# Patient Record
Sex: Male | Born: 1975
Health system: Southern US, Community
[De-identification: ages and names within clinical notes are randomized; demographics above are authoritative.]

## PROBLEM LIST (undated history)

## (undated) ENCOUNTER — Ambulatory Visit (HOSPITAL_COMMUNITY): Discharge status: Absent without leave | Payer: 59

## (undated) DIAGNOSIS — I10 Essential (primary) hypertension: Secondary | ICD-10-CM

## (undated) DIAGNOSIS — E119 Type 2 diabetes mellitus without complications: Secondary | ICD-10-CM

---

## 2013-10-08 HISTORY — PX: OTHER SURGICAL HISTORY: SHX169

## 2017-06-02 ENCOUNTER — Emergency Department
Admission: EM | Admit: 2017-06-02 | Discharge: 2017-06-02 | Disposition: A | Discharge status: Home / Self Care | Payer: Self-pay | Attending: Emergency Medicine | Admitting: Emergency Medicine

## 2017-06-02 ENCOUNTER — Encounter: Payer: Self-pay | Admitting: Emergency Medicine

## 2017-06-02 DIAGNOSIS — J320 Chronic maxillary sinusitis: Secondary | ICD-10-CM | POA: Insufficient documentation

## 2017-06-02 DIAGNOSIS — E119 Type 2 diabetes mellitus without complications: Secondary | ICD-10-CM | POA: Insufficient documentation

## 2017-06-02 DIAGNOSIS — F17200 Nicotine dependence, unspecified, uncomplicated: Secondary | ICD-10-CM | POA: Insufficient documentation

## 2017-06-02 DIAGNOSIS — Z79899 Other long term (current) drug therapy: Secondary | ICD-10-CM | POA: Insufficient documentation

## 2017-06-02 DIAGNOSIS — I10 Essential (primary) hypertension: Secondary | ICD-10-CM | POA: Insufficient documentation

## 2017-06-02 DIAGNOSIS — Z7984 Long term (current) use of oral hypoglycemic drugs: Secondary | ICD-10-CM | POA: Insufficient documentation

## 2017-06-02 DIAGNOSIS — R220 Localized swelling, mass and lump, head: Secondary | ICD-10-CM

## 2017-06-02 HISTORY — DX: Type 2 diabetes mellitus without complications: E11.9

## 2017-06-02 HISTORY — DX: Essential (primary) hypertension: I10

## 2017-06-02 LAB — CBC WITH DIFFERENTIAL/PLATELET
BASOS PCT: 1 %
Basophils Absolute: 0 10*3/uL (ref 0–0.1)
EOS ABS: 0.2 10*3/uL (ref 0–0.7)
EOS PCT: 3 %
HCT: 45.7 % (ref 40.0–52.0)
Hemoglobin: 15.5 g/dL (ref 13.0–18.0)
LYMPHS ABS: 1.4 10*3/uL (ref 1.0–3.6)
Lymphocytes Relative: 17 %
MCH: 33 pg (ref 26.0–34.0)
MCHC: 34 g/dL (ref 32.0–36.0)
MCV: 97 fL (ref 80.0–100.0)
MONOS PCT: 11 %
Monocytes Absolute: 0.9 10*3/uL (ref 0.2–1.0)
Neutro Abs: 5.8 10*3/uL (ref 1.4–6.5)
Neutrophils Relative %: 70 %
PLATELETS: 232 10*3/uL (ref 150–440)
RBC: 4.71 MIL/uL (ref 4.40–5.90)
RDW: 14.3 % (ref 11.5–14.5)
WBC: 8.3 10*3/uL (ref 3.8–10.6)

## 2017-06-02 LAB — COMPREHENSIVE METABOLIC PANEL
ALT: 112 U/L — ABNORMAL HIGH (ref 17–63)
ANION GAP: 7 (ref 5–15)
AST: 61 U/L — ABNORMAL HIGH (ref 15–41)
Albumin: 3.6 g/dL (ref 3.5–5.0)
Alkaline Phosphatase: 83 U/L (ref 38–126)
BUN: 8 mg/dL (ref 6–20)
CALCIUM: 9.9 mg/dL (ref 8.9–10.3)
CO2: 23 mmol/L (ref 22–32)
Chloride: 108 mmol/L (ref 101–111)
Creatinine, Ser: 0.7 mg/dL (ref 0.61–1.24)
GFR calc non Af Amer: >60 mL/min (ref >60)
GLUCOSE: 157 mg/dL — AB (ref 65–99)
POTASSIUM: 4.3 mmol/L (ref 3.5–5.1)
SODIUM: 138 mmol/L (ref 135–145)
TOTAL PROTEIN: 7.9 g/dL (ref 6.5–8.1)
Total Bilirubin: 0.5 mg/dL (ref 0.3–1.2)

## 2017-06-02 MED ORDER — FLUTICASONE PROPIONATE 50 MCG/ACT NA SUSP: 2.0000 | Freq: Every day | NASAL | 0 refills | Status: DC

## 2017-06-02 MED ORDER — AMOXICILLIN-POT CLAVULANATE 875-125 MG PO TABS: 1.0000 | ORAL_TABLET | Freq: Two times a day (BID) | ORAL | 0 refills | Status: AC

## 2017-06-02 MED ORDER — CETIRIZINE HCL 10 MG PO TABS: 10.0000 mg | ORAL_TABLET | Freq: Every day | ORAL | 0 refills | Status: DC

## 2017-06-02 NOTE — ED Provider Notes (Signed)
[facility] Emergency Department Provider Note  ___________________________________________   First MD Initiated Contact with Patient 06/02/17 [phone]     (approximate)  I have reviewed the triage vital signs and the nursing notes.   HISTORY  Chief Complaint Facial Swelling   HPI Curtis Peterson is a 41 y.o. male patient is here with complaint of redness and swelling under his left eye. 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 He also noted his grass yesterday. He denies any foreign body in his eye and there is been no issues with light sensitivity. Patient has had sinus infections in the past. Currently he has had some sniffles off and on. He is unaware of any fever or chills. He has not taken any over-the-counter medication. He denies any dental pain.   Past Medical History:  Diagnosis Date  . Diabetes mellitus without complication ([place])   . Hypertension     There are no active problems to display for this patient.   History reviewed. No pertinent surgical history.  Prior to Admission medications   Medication Sig Start Date End Date Taking? Authorizing Provider  lisinopril (PRINIVIL,ZESTRIL) 20 MG tablet Take 20 mg by mouth daily.   Yes [provider]  metFORMIN (GLUCOPHAGE) 500 MG tablet Take 500 mg by mouth 2 (two) times daily with a meal.   Yes [provider]  amoxicillin-clavulanate (AUGMENTIN) 875-125 MG tablet Take 1 tablet by mouth 2 (two) times daily. 06/02/17 06/09/17  Johnn Hai, PA-C  cetirizine (ZYRTEC) 10 MG tablet Take 1 tablet (10 mg total) by mouth daily. 06/02/17   Johnn Hai, PA-C  fluticasone (FLONASE) 50 MCG/ACT nasal spray Place 2 sprays into both nostrils daily. 06/02/17 06/02/18  Johnn Hai, PA-C    Allergies Patient has no known allergies.  No family history on file.  Social History Social History  Substance Use Topics  . Smoking status: Current Every Day Smoker    . Smokeless tobacco: Never Used  . Alcohol use No    Review of Systems Constitutional: No fever/chills Eyes: No visual changes. ENT: No sore throat. Cardiovascular: Denies chest pain. Respiratory: Denies shortness of breath. Gastrointestinal:  No nausea, no vomiting.  Skin: Soft tissue swelling left side of face. Neurological: Negative for headaches, focal weakness or numbness. ____________________________________________   PHYSICAL EXAM:  VITAL SIGNS: ED Triage Vitals  Enc Vitals Group     BP 06/02/17 0743 (!) 145/96     Pulse Rate 06/02/17 0743 88     Resp --      Temp 06/02/17 0743 98.1 F (36.7 C)     Temp Source 06/02/17 0743 Oral     SpO2 06/02/17 0743 98 %     Weight 06/02/17 0744 257 lb (116.6 kg)     Height 06/02/17 0744 5\' 11"  (1.803 m)     Head Circumference --      Peak Flow --      Pain Score --      Pain Loc --      Pain Edu? --      Excl. in [place]? --    Constitutional: Alert and oriented. Well appearing and in no acute distress. Eyes: Conjunctivae are normal. PERRL. EOMI. There is some infraorbital soft tissue edema without tenderness to palpation. There is no point tenderness on palpation along the orbit. EOMs do not increase pain. Head: Atraumatic. Nose: Minimal congestion/rhinnorhea. Mouth/Throat: Mucous membranes are moist.  Oropharynx non-erythematous. No dental abscess  is noted. Neck: No stridor.   Hematological/Lymphatic/Immunilogical: No cervical lymphadenopathy. Cardiovascular: Normal rate, regular rhythm. Grossly normal heart sounds.  Good peripheral circulation. Respiratory: Normal respiratory effort.  No retractions. Lungs CTAB. Neurologic:  Normal speech and language. No gross focal neurologic deficits are appreciated. Gait is normal. Skin:  Skin is warm, dry and intact. No rash noted. Psychiatric: Mood and affect are normal. Speech and behavior are normal.  ____________________________________________   LABS (all labs ordered are  listed, but only abnormal results are displayed)  Labs Reviewed  COMPREHENSIVE METABOLIC PANEL - Abnormal; Notable for the following:       Result Value   Glucose, Bld 157 (*)    AST 61 (*)    ALT 112 (*)    All other components within normal limits  CBC WITH DIFFERENTIAL/PLATELET    PROCEDURES  Procedure(s) performed: None  Procedures  Critical Care performed: No  ____________________________________________   INITIAL IMPRESSION / ASSESSMENT AND PLAN / ED COURSE  Pertinent labs & imaging results that were available during my care of the patient were reviewed by me and considered in my medical decision making (see chart for details).  Patient is given prescription for Zyrtec 10 mg daily, Flonase 2 sprays patient also daily, and Augmentin 875 one twice a day for 10 days. He is follow-up with [facility] clinic acute-care if any continued problems. Patient is being covered for a left maxillary sinusitis however this could be a early periorbital cellulitis. Patient will return if any severe worsening of his symptoms. He was also given a coupon to obtain his Augmentin cheaply at [employer].    ____________________________________________   FINAL CLINICAL IMPRESSION(S) / ED DIAGNOSES  Final diagnoses:  Left maxillary sinusitis  Facial swelling      NEW MEDICATIONS STARTED DURING THIS VISIT:  Discharge Medication List as of 06/02/2017  9:29 AM    START taking these medications   Details  amoxicillin-clavulanate (AUGMENTIN) 875-125 MG tablet Take 1 tablet by mouth 2 (two) times daily., Starting Sun 06/02/2017, Until Sun 06/09/2017, Print    cetirizine (ZYRTEC) 10 MG tablet Take 1 tablet (10 mg total) by mouth daily., Starting Sun 06/02/2017, Print    fluticasone (FLONASE) 50 MCG/ACT nasal spray Place 2 sprays into both nostrils daily., Starting Sun 06/02/2017, Until Mon 06/02/2018, Print         Note:  This document was prepared using Dragon voice recognition software and  may include unintentional dictation errors.    Johnn Hai, PA-C 06/02/17 1652    Schaevitz, Randall An, MD 06/06/17 [phone]

## 2017-06-02 NOTE — ED Triage Notes (Signed)
Presents to ED with redness and swelling left eye  Unsure if anything had gotten in eye  States he works in [employer] with a lot of dust  But also mowed his grass   Left eye is red and has periorbital edema

## 2017-06-02 NOTE — Discharge Instructions (Signed)
Begin taking Augmentin 875 one twice a day until finished. Take Zyrtec one daily. Flonase nasal spray 2 sprays each nostril once a day. Call the clinics listed above to see if they're taking new patients and get established for continued treatment of your hypertension and diabetes. [facility], [place] [facility], [facility], [place] are options. Take your coupon to Curtis Peterson who has the cheapest price on your antibiotic today.

## 2018-06-25 DIAGNOSIS — Z125 Encounter for screening for malignant neoplasm of prostate: Secondary | ICD-10-CM | POA: Diagnosis not present

## 2018-06-25 DIAGNOSIS — Z7689 Persons encountering health services in other specified circumstances: Secondary | ICD-10-CM | POA: Diagnosis not present

## 2018-06-25 DIAGNOSIS — E119 Type 2 diabetes mellitus without complications: Secondary | ICD-10-CM | POA: Diagnosis not present

## 2018-06-25 DIAGNOSIS — I10 Essential (primary) hypertension: Secondary | ICD-10-CM | POA: Diagnosis not present

## 2018-06-27 ENCOUNTER — Other Ambulatory Visit: Payer: Self-pay | Admitting: Internal Medicine

## 2018-06-27 DIAGNOSIS — R945 Abnormal results of liver function studies: Secondary | ICD-10-CM

## 2018-06-27 DIAGNOSIS — R7989 Other specified abnormal findings of blood chemistry: Secondary | ICD-10-CM

## 2018-07-17 ENCOUNTER — Ambulatory Visit
Admission: RE | Admit: 2018-07-17 | Discharge: 2018-07-17 | Disposition: A | Discharge status: Home / Self Care | Payer: 59 | Source: Ambulatory Visit | Attending: Internal Medicine | Admitting: Internal Medicine

## 2018-07-17 DIAGNOSIS — R945 Abnormal results of liver function studies: Secondary | ICD-10-CM | POA: Insufficient documentation

## 2018-07-17 DIAGNOSIS — K7689 Other specified diseases of liver: Secondary | ICD-10-CM | POA: Diagnosis not present

## 2018-07-17 DIAGNOSIS — R7989 Other specified abnormal findings of blood chemistry: Secondary | ICD-10-CM

## 2018-08-19 DIAGNOSIS — Z23 Encounter for immunization: Secondary | ICD-10-CM | POA: Diagnosis not present

## 2018-10-23 DIAGNOSIS — Z7689 Persons encountering health services in other specified circumstances: Secondary | ICD-10-CM | POA: Diagnosis not present

## 2018-10-23 DIAGNOSIS — E119 Type 2 diabetes mellitus without complications: Secondary | ICD-10-CM | POA: Diagnosis not present

## 2018-10-23 DIAGNOSIS — I10 Essential (primary) hypertension: Secondary | ICD-10-CM | POA: Diagnosis not present

## 2018-10-29 DIAGNOSIS — Z23 Encounter for immunization: Secondary | ICD-10-CM | POA: Diagnosis not present

## 2018-10-29 DIAGNOSIS — I10 Essential (primary) hypertension: Secondary | ICD-10-CM | POA: Diagnosis not present

## 2018-10-29 DIAGNOSIS — K760 Fatty (change of) liver, not elsewhere classified: Secondary | ICD-10-CM | POA: Insufficient documentation

## 2018-10-29 DIAGNOSIS — L700 Acne vulgaris: Secondary | ICD-10-CM | POA: Diagnosis not present

## 2018-10-29 DIAGNOSIS — E1165 Type 2 diabetes mellitus with hyperglycemia: Secondary | ICD-10-CM | POA: Insufficient documentation

## 2018-11-17 DIAGNOSIS — L668 Other cicatricial alopecia: Secondary | ICD-10-CM | POA: Diagnosis not present

## 2019-01-15 DIAGNOSIS — L668 Other cicatricial alopecia: Secondary | ICD-10-CM | POA: Diagnosis not present

## 2019-01-15 DIAGNOSIS — L732 Hidradenitis suppurativa: Secondary | ICD-10-CM | POA: Diagnosis not present

## 2019-01-15 DIAGNOSIS — L700 Acne vulgaris: Secondary | ICD-10-CM | POA: Diagnosis not present

## 2019-06-26 DIAGNOSIS — R253 Fasciculation: Secondary | ICD-10-CM | POA: Insufficient documentation

## 2019-06-26 DIAGNOSIS — R945 Abnormal results of liver function studies: Secondary | ICD-10-CM | POA: Insufficient documentation

## 2019-06-26 DIAGNOSIS — R7989 Other specified abnormal findings of blood chemistry: Secondary | ICD-10-CM | POA: Insufficient documentation

## 2019-06-30 ENCOUNTER — Encounter: Payer: Self-pay | Admitting: Emergency Medicine

## 2019-06-30 ENCOUNTER — Inpatient Hospital Stay: Payer: 59 | Admitting: Anesthesiology

## 2019-06-30 ENCOUNTER — Emergency Department: Payer: 59

## 2019-06-30 ENCOUNTER — Inpatient Hospital Stay
Admission: EM | Admit: 2019-06-30 | Discharge: 2019-07-03 | DRG: 982 | Disposition: A | Discharge status: Home / Self Care | Payer: 59 | Attending: Specialist | Admitting: Specialist

## 2019-06-30 ENCOUNTER — Encounter
Admission: EM | Disposition: A | Discharge status: Home / Self Care | Payer: Self-pay | Source: Home / Self Care | Attending: Specialist

## 2019-06-30 ENCOUNTER — Other Ambulatory Visit: Payer: Self-pay

## 2019-06-30 DIAGNOSIS — Z6833 Body mass index (BMI) 33.0-33.9, adult: Secondary | ICD-10-CM

## 2019-06-30 DIAGNOSIS — K6131 Horseshoe abscess: Principal | ICD-10-CM | POA: Diagnosis present

## 2019-06-30 DIAGNOSIS — L0231 Cutaneous abscess of buttock: Secondary | ICD-10-CM | POA: Diagnosis not present

## 2019-06-30 DIAGNOSIS — E669 Obesity, unspecified: Secondary | ICD-10-CM | POA: Diagnosis present

## 2019-06-30 DIAGNOSIS — Z8249 Family history of ischemic heart disease and other diseases of the circulatory system: Secondary | ICD-10-CM

## 2019-06-30 DIAGNOSIS — L03317 Cellulitis of buttock: Secondary | ICD-10-CM | POA: Diagnosis present

## 2019-06-30 DIAGNOSIS — Z20828 Contact with and (suspected) exposure to other viral communicable diseases: Secondary | ICD-10-CM | POA: Diagnosis present

## 2019-06-30 DIAGNOSIS — B9562 Methicillin resistant Staphylococcus aureus infection as the cause of diseases classified elsewhere: Secondary | ICD-10-CM | POA: Diagnosis present

## 2019-06-30 DIAGNOSIS — N492 Inflammatory disorders of scrotum: Secondary | ICD-10-CM | POA: Diagnosis present

## 2019-06-30 DIAGNOSIS — I10 Essential (primary) hypertension: Secondary | ICD-10-CM | POA: Diagnosis present

## 2019-06-30 DIAGNOSIS — F17210 Nicotine dependence, cigarettes, uncomplicated: Secondary | ICD-10-CM | POA: Diagnosis present

## 2019-06-30 DIAGNOSIS — L732 Hidradenitis suppurativa: Secondary | ICD-10-CM | POA: Diagnosis present

## 2019-06-30 DIAGNOSIS — E119 Type 2 diabetes mellitus without complications: Secondary | ICD-10-CM | POA: Diagnosis present

## 2019-06-30 DIAGNOSIS — Z79899 Other long term (current) drug therapy: Secondary | ICD-10-CM

## 2019-06-30 HISTORY — PX: INCISION AND DRAINAGE PERIRECTAL ABSCESS: SHX1804

## 2019-06-30 LAB — COMPREHENSIVE METABOLIC PANEL
ALT: 42 U/L (ref 0–44)
AST: 35 U/L (ref 15–41)
Albumin: 3.1 g/dL — ABNORMAL LOW (ref 3.5–5.0)
Alkaline Phosphatase: 84 U/L (ref 38–126)
Anion gap: 7 (ref 5–15)
BUN: 13 mg/dL (ref 6–20)
CO2: 24 mmol/L (ref 22–32)
Calcium: 9.7 mg/dL (ref 8.9–10.3)
Chloride: 104 mmol/L (ref 98–111)
Creatinine, Ser: 0.62 mg/dL (ref 0.61–1.24)
GFR calc Af Amer: >60 mL/min (ref >60)
GFR calc non Af Amer: >60 mL/min (ref >60)
Glucose, Bld: 143 mg/dL — ABNORMAL HIGH (ref 70–99)
Potassium: 4.3 mmol/L (ref 3.5–5.1)
Sodium: 135 mmol/L (ref 135–145)
Total Bilirubin: 0.8 mg/dL (ref 0.3–1.2)
Total Protein: 8.9 g/dL — ABNORMAL HIGH (ref 6.5–8.1)

## 2019-06-30 LAB — URINALYSIS, ROUTINE W REFLEX MICROSCOPIC
Bacteria, UA: NONE SEEN
Bilirubin Urine: NEGATIVE
Glucose, UA: NEGATIVE mg/dL
Hgb urine dipstick: NEGATIVE
Ketones, ur: NEGATIVE mg/dL
Leukocytes,Ua: NEGATIVE
Nitrite: NEGATIVE
Protein, ur: 30 mg/dL — AB
Specific Gravity, Urine: 1.027 (ref 1.005–1.030)
pH: 5 (ref 5.0–8.0)

## 2019-06-30 LAB — CBC WITH DIFFERENTIAL/PLATELET
Abs Immature Granulocytes: 0.06 10*3/uL (ref 0.00–0.07)
Basophils Absolute: 0.1 10*3/uL (ref 0.0–0.1)
Basophils Relative: 1 %
Eosinophils Absolute: 0.1 10*3/uL (ref 0.0–0.5)
Eosinophils Relative: 1 %
HCT: 44.1 % (ref 39.0–52.0)
Hemoglobin: 14.7 g/dL (ref 13.0–17.0)
Immature Granulocytes: 0 %
Lymphocytes Relative: 12 %
Lymphs Abs: 1.6 10*3/uL (ref 0.7–4.0)
MCH: 32.7 pg (ref 26.0–34.0)
MCHC: 33.3 g/dL (ref 30.0–36.0)
MCV: 98.2 fL (ref 80.0–100.0)
Monocytes Absolute: 1.3 10*3/uL — ABNORMAL HIGH (ref 0.1–1.0)
Monocytes Relative: 10 %
Neutro Abs: 10.4 10*3/uL — ABNORMAL HIGH (ref 1.7–7.7)
Neutrophils Relative %: 76 %
Platelets: 250 10*3/uL (ref 150–400)
RBC: 4.49 MIL/uL (ref 4.22–5.81)
RDW: 13.5 % (ref 11.5–15.5)
WBC: 13.5 10*3/uL — ABNORMAL HIGH (ref 4.0–10.5)
nRBC: 0 % (ref 0.0–0.2)

## 2019-06-30 LAB — PROTIME-INR
INR: 1.1 (ref 0.8–1.2)
Prothrombin Time: 13.8 seconds (ref 11.4–15.2)

## 2019-06-30 LAB — GLUCOSE, CAPILLARY: Glucose-Capillary: 119 mg/dL — ABNORMAL HIGH (ref 70–99)

## 2019-06-30 LAB — TSH: TSH: 2.052 u[IU]/mL (ref 0.350–4.500)

## 2019-06-30 LAB — SARS CORONAVIRUS 2 BY RT PCR (HOSPITAL ORDER, PERFORMED IN ~~LOC~~ HOSPITAL LAB): SARS Coronavirus 2: NEGATIVE

## 2019-06-30 LAB — LACTIC ACID, PLASMA
Lactic Acid, Venous: 0.8 mmol/L (ref 0.5–1.9)
Lactic Acid, Venous: 0.5 mmol/L (ref 0.5–1.9)

## 2019-06-30 LAB — SARS CORONAVIRUS 2 (TAT 6-24 HRS): SARS Coronavirus 2: NEGATIVE

## 2019-06-30 LAB — APTT: aPTT: 33 seconds (ref 24–36)

## 2019-06-30 SURGERY — INCISION AND DRAINAGE, ABSCESS, PERIRECTAL
Anesthesia: General

## 2019-06-30 MED ORDER — VANCOMYCIN HCL 1.5 G IV SOLR
1500.0000 mg | Freq: Two times a day (BID) | INTRAVENOUS | Status: DC
  Administered 2019-06-30 – 2019-07-03 (×6): 1500 mg via INTRAVENOUS
  Filled 2019-06-30 (×7): qty 1500

## 2019-06-30 MED ORDER — IOHEXOL 300 MG/ML  SOLN
100.0000 mL | Freq: Once | INTRAMUSCULAR | Status: AC | PRN
  Administered 2019-06-30: 100 mL via INTRAVENOUS

## 2019-06-30 MED ORDER — SEVOFLURANE IN SOLN
RESPIRATORY_TRACT | Status: AC
  Filled 2019-06-30: qty 250

## 2019-06-30 MED ORDER — MIDAZOLAM HCL 2 MG/2ML IJ SOLN
INTRAMUSCULAR | Status: DC | PRN
  Administered 2019-06-30: 2 mg via INTRAVENOUS

## 2019-06-30 MED ORDER — MORPHINE SULFATE (PF) 2 MG/ML IV SOLN
2.0000 mg | Freq: Once | INTRAVENOUS | Status: AC
  Administered 2019-06-30: 2 mg via INTRAVENOUS
  Filled 2019-06-30: qty 1

## 2019-06-30 MED ORDER — ACETAMINOPHEN 325 MG PO TABS: 650.0000 mg | ORAL_TABLET | Freq: Four times a day (QID) | ORAL | Status: DC | PRN

## 2019-06-30 MED ORDER — DEXMEDETOMIDINE HCL IN NACL 80 MCG/20ML IV SOLN
INTRAVENOUS | Status: AC
  Filled 2019-06-30: qty 20

## 2019-06-30 MED ORDER — FENTANYL CITRATE (PF) 100 MCG/2ML IJ SOLN
INTRAMUSCULAR | Status: AC
  Administered 2019-06-30: 25 ug via INTRAVENOUS
  Filled 2019-06-30: qty 2

## 2019-06-30 MED ORDER — ONDANSETRON HCL 4 MG/2ML IJ SOLN
4.0000 mg | Freq: Four times a day (QID) | INTRAMUSCULAR | Status: DC | PRN
  Administered 2019-07-02: 4 mg via INTRAVENOUS
  Filled 2019-06-30: qty 2

## 2019-06-30 MED ORDER — SODIUM CHLORIDE 0.9 % IV SOLN
INTRAVENOUS | Status: DC
  Administered 2019-06-30 – 2019-07-01 (×3): via INTRAVENOUS

## 2019-06-30 MED ORDER — METRONIDAZOLE IN NACL 5-0.79 MG/ML-% IV SOLN
INTRAVENOUS | Status: DC | PRN
  Administered 2019-06-30: 500 mg via INTRAVENOUS

## 2019-06-30 MED ORDER — ONDANSETRON HCL 4 MG/2ML IJ SOLN
4.0000 mg | Freq: Once | INTRAMUSCULAR | Status: AC
  Administered 2019-06-30: 4 mg via INTRAVENOUS
  Filled 2019-06-30: qty 2

## 2019-06-30 MED ORDER — LISINOPRIL 2.5 MG PO TABS
2.5000 mg | ORAL_TABLET | Freq: Every day | ORAL | Status: DC
  Administered 2019-07-02 – 2019-07-03 (×2): 2.5 mg via ORAL
  Filled 2019-06-30 (×3): qty 1

## 2019-06-30 MED ORDER — SODIUM CHLORIDE 0.9 % IV SOLN
2.0000 g | Freq: Once | INTRAVENOUS | Status: AC
  Administered 2019-06-30: 2 g via INTRAVENOUS
  Filled 2019-06-30: qty 20

## 2019-06-30 MED ORDER — OXYCODONE HCL 5 MG PO TABS
10.0000 mg | ORAL_TABLET | ORAL | Status: DC | PRN
  Administered 2019-06-30 – 2019-07-01 (×3): 10 mg via ORAL
  Filled 2019-06-30 (×3): qty 2

## 2019-06-30 MED ORDER — LIDOCAINE HCL (CARDIAC) PF 100 MG/5ML IV SOSY
PREFILLED_SYRINGE | INTRAVENOUS | Status: DC | PRN
  Administered 2019-06-30: 100 mg via INTRAVENOUS

## 2019-06-30 MED ORDER — DOCUSATE SODIUM 100 MG PO CAPS
100.0000 mg | ORAL_CAPSULE | Freq: Two times a day (BID) | ORAL | Status: DC
  Administered 2019-06-30 – 2019-07-03 (×6): 100 mg via ORAL
  Filled 2019-06-30 (×6): qty 1

## 2019-06-30 MED ORDER — ONDANSETRON HCL 4 MG PO TABS: 4.0000 mg | ORAL_TABLET | Freq: Four times a day (QID) | ORAL | Status: DC | PRN

## 2019-06-30 MED ORDER — MIDAZOLAM HCL 2 MG/2ML IJ SOLN
INTRAMUSCULAR | Status: AC
  Filled 2019-06-30: qty 2

## 2019-06-30 MED ORDER — MEPERIDINE HCL 50 MG/ML IJ SOLN: 6.2500 mg | INTRAMUSCULAR | Status: DC | PRN

## 2019-06-30 MED ORDER — DEXAMETHASONE SODIUM PHOSPHATE 10 MG/ML IJ SOLN
INTRAMUSCULAR | Status: DC | PRN
  Administered 2019-06-30: 10 mg via INTRAVENOUS

## 2019-06-30 MED ORDER — PIPERACILLIN-TAZOBACTAM 3.375 G IVPB
3.3750 g | Freq: Three times a day (TID) | INTRAVENOUS | Status: DC
  Administered 2019-06-30 – 2019-07-03 (×9): 3.375 g via INTRAVENOUS
  Filled 2019-06-30 (×9): qty 50

## 2019-06-30 MED ORDER — OXYCODONE HCL 5 MG PO TABS: 5.0000 mg | ORAL_TABLET | ORAL | Status: DC | PRN

## 2019-06-30 MED ORDER — PROPOFOL 10 MG/ML IV BOLUS
INTRAVENOUS | Status: DC | PRN
  Administered 2019-06-30: 200 mg via INTRAVENOUS

## 2019-06-30 MED ORDER — LORATADINE 10 MG PO TABS
10.0000 mg | ORAL_TABLET | Freq: Every day | ORAL | Status: DC
  Filled 2019-06-30: qty 1

## 2019-06-30 MED ORDER — MORPHINE SULFATE (PF) 4 MG/ML IV SOLN
4.0000 mg | INTRAVENOUS | Status: DC | PRN
  Administered 2019-07-02 (×2): 4 mg via INTRAVENOUS
  Filled 2019-06-30 (×2): qty 1

## 2019-06-30 MED ORDER — METRONIDAZOLE IN NACL 5-0.79 MG/ML-% IV SOLN
500.0000 mg | Freq: Once | INTRAVENOUS | Status: DC
  Filled 2019-06-30: qty 100

## 2019-06-30 MED ORDER — SODIUM CHLORIDE 0.9 % IV BOLUS
1000.0000 mL | Freq: Once | INTRAVENOUS | Status: AC
  Administered 2019-06-30: 1000 mL via INTRAVENOUS

## 2019-06-30 MED ORDER — FENTANYL CITRATE (PF) 100 MCG/2ML IJ SOLN
INTRAMUSCULAR | Status: DC | PRN
  Administered 2019-06-30 (×4): 50 ug via INTRAVENOUS

## 2019-06-30 MED ORDER — VANCOMYCIN HCL IN DEXTROSE 1-5 GM/200ML-% IV SOLN
1000.0000 mg | Freq: Once | INTRAVENOUS | Status: DC
  Filled 2019-06-30: qty 200

## 2019-06-30 MED ORDER — FENTANYL CITRATE (PF) 100 MCG/2ML IJ SOLN
25.0000 ug | INTRAMUSCULAR | Status: AC | PRN
  Administered 2019-06-30 (×6): 25 ug via INTRAVENOUS

## 2019-06-30 MED ORDER — ACETAMINOPHEN 10 MG/ML IV SOLN
INTRAVENOUS | Status: DC | PRN
  Administered 2019-06-30: 1000 mg via INTRAVENOUS

## 2019-06-30 MED ORDER — ACETAMINOPHEN 10 MG/ML IV SOLN
INTRAVENOUS | Status: AC
  Filled 2019-06-30: qty 100

## 2019-06-30 MED ORDER — FENTANYL CITRATE (PF) 100 MCG/2ML IJ SOLN
INTRAMUSCULAR | Status: AC
  Filled 2019-06-30: qty 2

## 2019-06-30 MED ORDER — ACETAMINOPHEN 650 MG RE SUPP: 650.0000 mg | Freq: Four times a day (QID) | RECTAL | Status: DC | PRN

## 2019-06-30 MED ORDER — LACTATED RINGERS IV SOLN
INTRAVENOUS | Status: DC | PRN
  Administered 2019-06-30: 12:00:00 via INTRAVENOUS

## 2019-06-30 MED ORDER — OXYCODONE HCL 5 MG PO TABS
ORAL_TABLET | ORAL | Status: AC
  Filled 2019-06-30: qty 1

## 2019-06-30 MED ORDER — FLUTICASONE PROPIONATE 50 MCG/ACT NA SUSP
2.0000 | Freq: Every day | NASAL | Status: DC
  Administered 2019-07-01 – 2019-07-03 (×3): 2 via NASAL
  Filled 2019-06-30: qty 16

## 2019-06-30 MED ORDER — DEXMEDETOMIDINE HCL 200 MCG/2ML IV SOLN
INTRAVENOUS | Status: DC | PRN
  Administered 2019-06-30: 20 ug via INTRAVENOUS
  Administered 2019-06-30: 16 ug via INTRAVENOUS
  Administered 2019-06-30: 20 ug via INTRAVENOUS

## 2019-06-30 MED ORDER — LISINOPRIL 10 MG PO TABS: 20.0000 mg | ORAL_TABLET | Freq: Every day | ORAL | Status: DC

## 2019-06-30 MED ORDER — OXYCODONE HCL 5 MG PO TABS
5.0000 mg | ORAL_TABLET | Freq: Once | ORAL | Status: AC | PRN
  Administered 2019-06-30: 5 mg via ORAL

## 2019-06-30 MED ORDER — OXYCODONE HCL 5 MG/5ML PO SOLN: 5.0000 mg | Freq: Once | ORAL | Status: AC | PRN

## 2019-06-30 MED ORDER — VANCOMYCIN HCL 10 G IV SOLR
2000.0000 mg | Freq: Once | INTRAVENOUS | Status: AC
  Administered 2019-06-30: 2000 mg via INTRAVENOUS
  Filled 2019-06-30: qty 2000

## 2019-06-30 MED ORDER — SODIUM CHLORIDE 0.9 % IV SOLN: 1.0000 g | INTRAVENOUS | Status: DC

## 2019-06-30 MED ORDER — PROMETHAZINE HCL 25 MG/ML IJ SOLN: 6.2500 mg | INTRAMUSCULAR | Status: DC | PRN

## 2019-06-30 MED ORDER — ENOXAPARIN SODIUM 40 MG/0.4ML ~~LOC~~ SOLN
40.0000 mg | SUBCUTANEOUS | Status: DC
  Administered 2019-06-30 – 2019-07-03 (×4): 40 mg via SUBCUTANEOUS
  Filled 2019-06-30 (×4): qty 0.4

## 2019-06-30 MED ORDER — BUPIVACAINE-EPINEPHRINE (PF) 0.25% -1:200000 IJ SOLN
INTRAMUSCULAR | Status: AC
  Filled 2019-06-30: qty 30

## 2019-06-30 MED ORDER — MORPHINE SULFATE (PF) 4 MG/ML IV SOLN
4.0000 mg | Freq: Once | INTRAVENOUS | Status: AC
  Administered 2019-06-30: 4 mg via INTRAVENOUS
  Filled 2019-06-30: qty 1

## 2019-06-30 MED ORDER — ONDANSETRON HCL 4 MG/2ML IJ SOLN
INTRAMUSCULAR | Status: DC | PRN
  Administered 2019-06-30: 4 mg via INTRAVENOUS

## 2019-06-30 SURGICAL SUPPLY — 28 items
BLADE CLIPPER SURG (BLADE) ×2 IMPLANT
BLADE SURG 15 STRL LF DISP TIS (BLADE) ×1 IMPLANT
BLADE SURG 15 STRL SS (BLADE) ×1
BNDG CONFORM 2 STRL LF (GAUZE/BANDAGES/DRESSINGS) ×2 IMPLANT
BNDG CONFORM 3 STRL LF (GAUZE/BANDAGES/DRESSINGS) ×2 IMPLANT
BRUSH SCRUB EZ  4% CHG (MISCELLANEOUS) ×1
BRUSH SCRUB EZ 4% CHG (MISCELLANEOUS) ×1 IMPLANT
CANISTER SUCT 1200ML W/VALVE (MISCELLANEOUS) ×2 IMPLANT
COVER WAND RF STERILE (DRAPES) ×2 IMPLANT
DRAPE LEGGINS SURG 28X43 STRL (DRAPES) ×2 IMPLANT
DRAPE UNDER BUTTOCK W/FLU (DRAPES) ×2 IMPLANT
ELECT CAUTERY BLADE 6.4 (BLADE) ×2 IMPLANT
ELECT REM PT RETURN 9FT ADLT (ELECTROSURGICAL) ×2
ELECTRODE REM PT RTRN 9FT ADLT (ELECTROSURGICAL) ×1 IMPLANT
GLOVE BIO SURGEON STRL SZ7 (GLOVE) ×2 IMPLANT
GOWN STRL REUS W/ TWL LRG LVL3 (GOWN DISPOSABLE) ×2 IMPLANT
GOWN STRL REUS W/TWL LRG LVL3 (GOWN DISPOSABLE) ×2
NEEDLE HYPO 22GX1.5 SAFETY (NEEDLE) ×2 IMPLANT
NS IRRIG 1000ML POUR BTL (IV SOLUTION) ×2 IMPLANT
PACK BASIN MINOR ARMC (MISCELLANEOUS) ×2 IMPLANT
PAD ABD DERMACEA PRESS 5X9 (GAUZE/BANDAGES/DRESSINGS) ×4 IMPLANT
PAD PREP 24X41 OB/GYN DISP (PERSONAL CARE ITEMS) ×2 IMPLANT
SOL PREP PVP 2OZ (MISCELLANEOUS) ×2
SOLUTION PREP PVP 2OZ (MISCELLANEOUS) ×1 IMPLANT
SPONGE LAP 18X18 RF (DISPOSABLE) ×4 IMPLANT
SURGILUBE 2OZ TUBE FLIPTOP (MISCELLANEOUS) ×2 IMPLANT
SWAB DUAL CULTURE TRANS RED ST (MISCELLANEOUS) ×2 IMPLANT
SYR 20ML LL LF (SYRINGE) ×2 IMPLANT

## 2019-06-30 NOTE — ED Triage Notes (Signed)
Patient ambulatory to triage with steady gait, without difficulty or distress noted, mask in place, brought in by EMS; pt reports abscess to coccyx x 2wks with hx of same

## 2019-06-30 NOTE — Progress Notes (Signed)
Per Zack PA okay for RN tpo decrease IVF rate to 50 ml/hr.

## 2019-06-30 NOTE — Progress Notes (Signed)
CODE SEPSIS - PHARMACY COMMUNICATION  **Broad Spectrum Antibiotics should be administered within 1 hour of Sepsis diagnosis**  Time Code Sepsis Called/Page Received: [id number]  Antibiotics Ordered: vanc/ceftriaxone  Time of 1st antibiotic administration: 0441  Additional action taken by pharmacy:   If necessary, Name of Provider/Nurse Contacted:     Tobie Lords ,PharmD Clinical Pharmacist  06/30/2019  5:08 AM

## 2019-06-30 NOTE — Anesthesia Procedure Notes (Signed)
Procedure Name: LMA Insertion Date/Time: 06/30/2019 12:25 PM Performed by: Justus Memory, CRNA Pre-anesthesia Checklist: Patient identified, Patient being monitored, Timeout performed, Emergency Drugs available and Suction available Patient Re-evaluated:Patient Re-evaluated prior to induction Oxygen Delivery Method: Circle system utilized Preoxygenation: Pre-oxygenation with 100% oxygen Induction Type: IV induction Ventilation: Mask ventilation without difficulty LMA: LMA inserted LMA Size: 4.5 Tube type: Oral Number of attempts: 1 Placement Confirmation: positive ETCO2 and breath sounds checked- equal and bilateral Tube secured with: Tape Dental Injury: Teeth and Oropharynx as per pre-operative assessment

## 2019-06-30 NOTE — Anesthesia Preprocedure Evaluation (Signed)
Anesthesia Evaluation  Patient identified by MRN, date of birth, ID band Patient awake    Reviewed: Allergy & Precautions, NPO status , Patient's Chart, lab work & pertinent test results  History of Anesthesia Complications Negative for: history of anesthetic complications  Airway Mallampati: II  TM Distance: >3 FB Neck ROM: Full    Dental  (+) Poor Dentition   Pulmonary neg sleep apnea, neg COPD, Current SmokerPatient did not abstain from smoking.,    breath sounds clear to auscultation- rhonchi (-) wheezing      Cardiovascular hypertension, Pt. on medications (-) CAD, (-) Past MI, (-) Cardiac Stents and (-) CABG  Rhythm:Regular Rate:Normal - Systolic murmurs and - Diastolic murmurs    Neuro/Psych neg Seizures negative neurological ROS  negative psych ROS   GI/Hepatic negative GI ROS, Neg liver ROS,   Endo/Other  diabetes  Renal/GU negative Renal ROS     Musculoskeletal negative musculoskeletal ROS (+)   Abdominal (+) + obese,   Peds  Hematology negative hematology ROS (+)   Anesthesia Other Findings Past Medical History: No date: Diabetes mellitus without complication (HCC) No date: Hypertension   Reproductive/Obstetrics                             Anesthesia Physical Anesthesia Plan  ASA: II  Anesthesia Plan: General   Post-op Pain Management:    Induction: Intravenous  PONV Risk Score and Plan: 0 and Ondansetron  Airway Management Planned: LMA  Additional Equipment:   Intra-op Plan:   Post-operative Plan:   Informed Consent: I have reviewed the patients History and Physical, chart, labs and discussed the procedure including the risks, benefits and alternatives for the proposed anesthesia with the patient or authorized representative who has indicated his/her understanding and acceptance.     Dental advisory given  Plan Discussed with: CRNA and  Anesthesiologist  Anesthesia Plan Comments:         Anesthesia Quick Evaluation

## 2019-06-30 NOTE — H&P (Signed)
Curtis Peterson is an 43 y.o. male.   Chief Complaint: Abscess HPI: The patient with past medical history of diabetes and hypertension presents to the emergency department complaining of pain in his tailbone.  The patient reports that he has an abscess in his gluteal cleft that has been developing over the last 2 weeks.  The patient has had this happen in the past.  He denies fevers but admits to chills and malaise.  The patient was found to meet criteria for sepsis which prompted initiation of sepsis protocol.  He received ceftriaxone and vancomycin prior to the emergency department staff, hospitalist service for admission.  Past Medical History:  Diagnosis Date  . Diabetes mellitus without complication ([place])   . Hypertension     Past Surgical History:  Procedure Laterality Date  . head lesions removed  2015   None  Family History  Problem Relation Age of Onset  . Hypertension Other     Social History:  reports that he has been smoking cigarettes. He has been smoking about 0.50 packs per day. He has never used smokeless tobacco. He reports that he does not drink alcohol or use drugs.  Allergies: No Known Allergies  Prior to Admission medications   Medication Sig Start Date End Date Taking? Authorizing Provider  lisinopril (ZESTRIL) 2.5 MG tablet Take 2.5 mg by mouth daily.    Yes [provider]  tiZANidine (ZANAFLEX) 2 MG tablet Take 2 mg by mouth 3 (three) times daily as needed. 06/26/19  Yes [provider]  trimethoprim-polymyxin b (POLYTRIM) ophthalmic solution Place 1 drop into the right eye every 4 (four) hours. 06/26/19 07/05/19 Yes [provider]     Results for orders placed or performed during the hospital encounter of 06/30/19 (from the past 48 hour(s))  Lactic acid, plasma     Status: None   Collection Time: 06/30/19  4:33 AM  Result Value Ref Range   Lactic Acid, Venous 0.8 0.5 - 1.9 mmol/L    Comment: Performed at [facility], [address] [place], [place] [address]  Comprehensive metabolic panel     Status: Abnormal   Collection Time: 06/30/19  4:33 AM  Result Value Ref Range   Sodium 135 135 - 145 mmol/L   Potassium 4.3 3.5 - 5.1 mmol/L   Chloride 104 98 - 111 mmol/L   CO2 24 22 - 32 mmol/L   Glucose, Bld 143 (H) 70 - 99 mg/dL   BUN 13 6 - 20 mg/dL   Creatinine, Ser 0.62 0.61 - 1.24 mg/dL   Calcium 9.7 8.9 - 10.3 mg/dL   Total Protein 8.9 (H) 6.5 - 8.1 g/dL   Albumin 3.1 (L) 3.5 - 5.0 g/dL   AST 35 15 - 41 U/L   ALT 42 0 - 44 U/L   Alkaline Phosphatase 84 38 - 126 U/L   Total Bilirubin 0.8 0.3 - 1.2 mg/dL   GFR calc non Af Amer >60 >60 mL/min   GFR calc Af Amer >60 >60 mL/min   Anion gap 7 5 - 15    Comment: Performed at [facility], [address] [place], [place] [address]  CBC WITH DIFFERENTIAL     Status: Abnormal   Collection Time: 06/30/19  4:33 AM  Result Value Ref Range   WBC 13.5 (H) 4.0 - 10.5 K/uL   RBC 4.49 4.22 - 5.81 MIL/uL   Hemoglobin 14.7 13.0 - 17.0 g/dL   HCT 44.1 39.0 - 52.0 %  MCV 98.2 80.0 - 100.0 fL   MCH 32.7 26.0 - 34.0 pg   MCHC 33.3 30.0 - 36.0 g/dL   RDW 13.5 11.5 - 15.5 %   Platelets 250 150 - 400 K/uL   nRBC 0.0 0.0 - 0.2 %   Neutrophils Relative % 76 %   Neutro Abs 10.4 (H) 1.7 - 7.7 K/uL   Lymphocytes Relative 12 %   Lymphs Abs 1.6 0.7 - 4.0 K/uL   Monocytes Relative 10 %   Monocytes Absolute 1.3 (H) 0.1 - 1.0 K/uL   Eosinophils Relative 1 %   Eosinophils Absolute 0.1 0.0 - 0.5 K/uL   Basophils Relative 1 %   Basophils Absolute 0.1 0.0 - 0.1 K/uL   Immature Granulocytes 0 %   Abs Immature Granulocytes 0.06 0.00 - 0.07 K/uL    Comment: Performed at [facility], [address] [place], [place] [address]  APTT     Status: None   Collection Time: 06/30/19  4:33 AM  Result Value Ref Range   aPTT 33 24 - 36 seconds    Comment: Performed at [facility], [address] [place], [place] [address]  Protime-INR     Status: None    Collection Time: 06/30/19  4:33 AM  Result Value Ref Range   Prothrombin Time 13.8 11.4 - 15.2 seconds   INR 1.1 0.8 - 1.2    Comment: (NOTE) INR goal varies based on device and disease states. Performed at [facility], [address] [place], [place] [address]   Blood Culture (routine x 2)     Status: None (Preliminary result)   Collection Time: 06/30/19  4:33 AM   Specimen: BLOOD  Result Value Ref Range   Specimen Description BLOOD RIGHT AC    Special Requests      BOTTLES DRAWN AEROBIC AND ANAEROBIC Blood Culture results may not be optimal due to an excessive volume of blood received in culture bottles   Culture      NO GROWTH <12 HOURS Performed at [facility], [address], [place], [place] [address]    Report Status PENDING   TSH     Status: None   Collection Time: 06/30/19  4:33 AM  Result Value Ref Range   TSH 2.052 0.350 - 4.500 uIU/mL    Comment: Performed by a 3rd Generation assay with a functional sensitivity of <=0.01 uIU/mL. Performed at [facility], [address] [place],  [address]   Hemoglobin A1c     Status: Abnormal   Collection Time: 06/30/19  4:33 AM  Result Value Ref Range   Hgb A1c MFr Bld 6.0 (H) 4.8 - 5.6 %    Comment: (NOTE)         Prediabetes: 5.7 - 6.4         Diabetes: >6.4         Glycemic control for adults with diabetes: <7.0    Mean Plasma Glucose 126 mg/dL    Comment: (NOTE) Performed At: [facility] [place], [place] [id number] Rush Farmer MD [id number]   Blood Culture (routine x 2)     Status: None (Preliminary result)   Collection Time: 06/30/19  4:36 AM   Specimen: BLOOD  Result Value Ref Range   Specimen Description BLOOD LEFT FA    Special Requests      BOTTLES DRAWN AEROBIC AND ANAEROBIC Blood Culture results may not be optimal due to an inadequate volume of blood received  in culture bottles   Culture      NO GROWTH <12 HOURS Performed at [facility], [address] [place], [place] [address]    Report Status PENDING   Urinalysis, Routine w reflex microscopic     Status: Abnormal   Collection Time: 06/30/19  5:14 AM  Result Value Ref Range   Color, Urine YELLOW (A) YELLOW   APPearance CLEAR (A) CLEAR   Specific Gravity, Urine 1.027 1.005 - 1.030   pH 5.0 5.0 - 8.0   Glucose, UA NEGATIVE NEGATIVE mg/dL   Hgb urine dipstick NEGATIVE NEGATIVE   Bilirubin Urine NEGATIVE NEGATIVE   Ketones, ur NEGATIVE NEGATIVE mg/dL   Protein, ur 30 (A) NEGATIVE mg/dL   Nitrite NEGATIVE NEGATIVE   Leukocytes,Ua NEGATIVE NEGATIVE   RBC / HPF 0-5 0 - 5 RBC/hpf   WBC, UA 0-5 0 - 5 WBC/hpf   Bacteria, UA NONE SEEN NONE SEEN   Squamous Epithelial / LPF 0-5 0 - 5   Mucus PRESENT     Comment: Performed at [facility], [address] [place], [place] [address]  Lactic acid, plasma     Status: None   Collection Time: 06/30/19  6:27 AM  Result Value Ref Range   Lactic Acid, Venous 0.5 0.5 - 1.9 mmol/L    Comment: Performed at [facility], [address] [place], [place] [address]  SARS CORONAVIRUS 2 (TAT 6-24 HRS) Nasopharyngeal Nasopharyngeal Swab     Status: None   Collection Time: 06/30/19  6:49 AM   Specimen: Nasopharyngeal Swab  Result Value Ref Range   SARS Coronavirus 2 NEGATIVE NEGATIVE    Comment: (NOTE) SARS-CoV-2 target nucleic acids are NOT DETECTED. The SARS-CoV-2 RNA is generally detectable in upper and lower respiratory specimens during the acute phase of infection. Negative results do not preclude SARS-CoV-2 infection, do not rule out co-infections with other pathogens, and should not be used as the sole basis for treatment or other patient management decisions. Negative results must be combined with clinical observations, patient history, and epidemiological information. The expected result is Negative. Fact Sheet for Patients: SugarRoll.be Fact Sheet for  Healthcare Providers: https://www.woods-mathews.com/ This test is not yet approved or cleared by the [place] FDA and  has been authorized for detection and/or diagnosis of SARS-CoV-2 by FDA under an Emergency Use Authorization (EUA). This EUA will remain  in effect (meaning this test can be used) for the duration of the COVID-19 declaration under Section 56 4(b)(1) of the Act, 21 U.S.C. section 360bbb-3(b)(1), unless the authorization is terminated or revoked sooner. Performed at [facility], [address], [place], [place] [address]   SARS Coronavirus 2 [facility] [place] order, Performed in [facility] hospital lab) Nasopharyngeal Nasopharyngeal Swab     Status: None   Collection Time: 06/30/19 10:55 AM   Specimen: Nasopharyngeal Swab  Result Value Ref Range   SARS Coronavirus 2 NEGATIVE NEGATIVE    Comment: (NOTE) If result is NEGATIVE SARS-CoV-2 target nucleic acids are NOT DETECTED. The SARS-CoV-2 RNA is generally detectable in upper and lower  respiratory specimens during the acute phase of infection. The lowest  concentration of SARS-CoV-2 viral copies this assay can detect is 250  copies / mL. A negative result does not preclude SARS-CoV-2 infection  and should not be used as the sole basis for treatment or other  patient management decisions.  A negative result may occur with  improper specimen collection / handling, submission of specimen other  than nasopharyngeal swab, presence of viral mutation(s) within the  areas targeted by this assay, and inadequate number of viral copies  (<250 copies / mL). A negative result must be combined with clinical  observations, patient history, and epidemiological information. If result is POSITIVE SARS-CoV-2 target nucleic acids are DETECTED. The SARS-CoV-2 RNA is generally detectable in upper and lower  respiratory specimens dur ing the acute phase of infection.  Positive  results are indicative of active infection  with SARS-CoV-2.  Clinical  correlation with patient history and other diagnostic information is  necessary to determine patient infection status.  Positive results do  not rule out bacterial infection or co-infection with other viruses. If result is PRESUMPTIVE POSTIVE SARS-CoV-2 nucleic acids MAY BE PRESENT.   A presumptive positive result was obtained on the submitted specimen  and confirmed on repeat testing.  While 2019 novel coronavirus  (SARS-CoV-2) nucleic acids may be present in the submitted sample  additional confirmatory testing may be necessary for epidemiological  and / or clinical management purposes  to differentiate between  SARS-CoV-2 and other Sarbecovirus currently known to infect humans.  If clinically indicated additional testing with an alternate test  methodology [phone]) is advised. The SARS-CoV-2 RNA is generally  detectable in upper and lower respiratory sp ecimens during the acute  phase of infection. The expected result is Negative. Fact Sheet for Patients:  StrictlyIdeas.no Fact Sheet for Healthcare Providers: BankingDealers.co.za This test is not yet approved or cleared by the [place] FDA and has been authorized for detection and/or diagnosis of SARS-CoV-2 by FDA under an Emergency Use Authorization (EUA).  This EUA will remain in effect (meaning this test can be used) for the duration of the COVID-19 declaration under Section 564(b)(1) of the Act, 21 U.S.C. section 360bbb-3(b)(1), unless the authorization is terminated or revoked sooner. Performed at [facility], [address] [place], [place] [address]   Aerobic/Anaerobic Culture (surgical/deep wound)     Status: None (Preliminary result)   Collection Time: 06/30/19 12:41 PM   Specimen: Wound; Abscess  Result Value Ref Range   Specimen Description      WOUND ABSCESS 1 Performed at [facility], [address],  [place], [place] [address]    Special Requests      NONE Performed at [facility], [place], [place] [address]    Gram Stain      RARE WBC PRESENT, PREDOMINANTLY PMN RARE GRAM POSITIVE COCCI Performed at [facility], [place] [address], [place], [place] [address]    Culture PENDING    Report Status PENDING   Aerobic/Anaerobic Culture (surgical/deep wound)     Status: None (Preliminary result)   Collection Time: 06/30/19 12:55 PM   Specimen: Wound; Abscess  Result Value Ref Range   Specimen Description      WOUND ABSCESS 2 Performed at [facility], [address] [place], [place] [address]    Special Requests      NONE Performed at [facility], [place], [place] [address]    Gram Stain      FEW WBC PRESENT, PREDOMINANTLY PMN ABUNDANT GRAM POSITIVE COCCI RARE GRAM NEGATIVE RODS Performed at [facility], [place] [address] [place], [place] [address]    Culture PENDING    Report Status PENDING   Glucose, capillary     Status: Abnormal   Collection Time: 06/30/19  1:30 PM  Result Value Ref Range   Glucose-Capillary 119 (  H) 70 - 99 mg/dL   Ct Pelvis W Contrast  Result Date: 06/30/2019 CLINICAL DATA:  Gluteal abscess EXAM: CT PELVIS WITH CONTRAST TECHNIQUE: Multidetector CT imaging of the pelvis was performed using the standard protocol following the bolus administration of intravenous contrast. CONTRAST:  138mL OMNIPAQUE IOHEXOL 300 MG/ML  SOLN COMPARISON:  None. FINDINGS: Urinary Tract:  No abnormality visualized. Bowel:  Unremarkable visualized pelvic bowel loops. Vascular/Lymphatic: Enlarged, reactive bilateral inguinal lymph nodes without cavitation Reproductive: Minimal subcutaneous low-density along the posterior left scrotum without adjacent inflammatory changes, marked on axial images. Other: Gluteal and perianal cellulitic changes with right gluteal abscess measuring 44 x 23 x 15 mm. A small component is seen  wrapping anteriorly around to the left aspect of the anus. Musculoskeletal: No acute finding IMPRESSION: 1. Gluteal and perianal cellulitis with 4.4 x 2.3 x 1.5 cm right gluteal subcutaneous abscess. The collection wraps around the anterior to left aspect of the anus. No supralevator inflammation. 2. Minimal subcutaneous low-density at the level of the posterior left scrotum is of indeterminate relationship to #1 as there is no adjacent cutaneous inflammatory changes. Electronically Signed   By: Monte Fantasia M.D.   On: 06/30/2019 05:25    Review of Systems  Constitutional: Negative for chills and fever.  HENT: Negative for sore throat and tinnitus.   Eyes: Negative for blurred vision and redness.  Respiratory: Negative for cough and shortness of breath.   Cardiovascular: Negative for chest pain, palpitations, orthopnea and PND.  Gastrointestinal: Negative for abdominal pain, diarrhea, nausea and vomiting.  Genitourinary: Negative for dysuria, frequency and urgency.  Musculoskeletal: Negative for joint pain and myalgias.  Skin: Negative for rash.       No lesions  Neurological: Negative for speech change, focal weakness and weakness.  Endo/Heme/Allergies: Does not bruise/bleed easily.       No temperature intolerance  Psychiatric/Behavioral: Negative for depression and suicidal ideas.    Blood pressure 105/67, pulse 80, temperature 97.7 F (36.5 C), temperature source Oral, resp. rate 18, height 5' 10.98" (1.803 m), weight 101.2 kg, SpO2 96 %. Physical Exam  Vitals reviewed. Constitutional: He is oriented to person, place, and time. He appears well-developed and well-nourished. No distress.  HENT:  Head: Normocephalic and atraumatic.  Mouth/Throat: Oropharynx is clear and moist.  Eyes: Pupils are equal, round, and reactive to light. Conjunctivae and EOM are normal. No scleral icterus.  Neck: Normal range of motion. Neck supple. No JVD present. No tracheal deviation present. No  thyromegaly present.  Cardiovascular: Normal rate, regular rhythm and normal heart sounds. Exam reveals no gallop and no friction rub.  No murmur heard. Respiratory: Effort normal and breath sounds normal. No respiratory distress.  GI: Soft. Bowel sounds are normal. He exhibits no distension. There is no abdominal tenderness.  Genitourinary:    Genitourinary Comments: Deferred (see ED physician exam)   Musculoskeletal: Normal range of motion.        General: No edema.  Lymphadenopathy:    He has no cervical adenopathy.  Neurological: He is alert and oriented to person, place, and time. No cranial nerve deficit.  Skin: Skin is warm and dry. No rash noted. No erythema.  Psychiatric: He has a normal mood and affect. His behavior is normal. Judgment and thought content normal.     Assessment/Plan This is a 43 year old male admitted for gluteal cellulitis and abscess. 1.  Gluteal cellulitis: With abscess and extension to perianal region.  Continue antibiotics.  Consult surgery. 2.  Sepsis: The  patient meets criteria via leukocytosis, tachycardia and tachypnea.  He is hemodynamically stable.  Follow blood cultures for growth and sensitivities. 3.  Hypertension: Initially uncontrolled likely secondary to pain.  The patient's blood pressure is now within normal range.  Continue lisinopril 4.  Obesity: BMI is 31.1; encouraged healthy diet and exercise 5.  DVT prophylaxis: Lovenox 6.  GI prophylaxis: None The patient is a full code.  Time spent on admission orders and patient care approximately 45 minutes  Harrie Foreman, MD 07/01/2019, 3:28 AM

## 2019-06-30 NOTE — ED Provider Notes (Signed)
[facility] Emergency Department Provider Note    First MD Initiated Contact with Patient 06/30/19 [id number]     (approximate)  I have reviewed the triage vital signs and the nursing notes.   HISTORY  Chief Complaint Abscess   HPI Curtis Peterson is a 43 y.o. male with history of hypertension and diabetes presents to the emergency department with "abscess on my bottom x2 weeks".  Patient states that pain swelling has worsened.  Patient denies any fever afebrile on presentation.  Patient denies any devious history of abscess.  Patient states that pain is worse with attempting to sit on his buttocks and when walking       Past Medical History:  Diagnosis Date  . Diabetes mellitus without complication ([place])   . Hypertension     Patient Active Problem List   Diagnosis Date Noted  . Abscess and cellulitis of gluteal region 06/30/2019    History reviewed. No pertinent surgical history.  Prior to Admission medications   Medication Sig Start Date End Date Taking? Authorizing Provider  lisinopril (ZESTRIL) 2.5 MG tablet Take 2.5 mg by mouth daily.    Yes [provider]  tiZANidine (ZANAFLEX) 2 MG tablet Take 2 mg by mouth 3 (three) times daily as needed. 06/26/19  Yes [provider]  trimethoprim-polymyxin b (POLYTRIM) ophthalmic solution Place 1 drop into the right eye every 4 (four) hours. 06/26/19 07/05/19 Yes [provider]    Allergies Patient has no known allergies.  No family history on file.  Social History Social History   Tobacco Use  . Smoking status: Current Every Day Smoker  . Smokeless tobacco: Never Used  Substance Use Topics  . Alcohol use: No  . Drug use: No    Review of Systems Constitutional: No fever/chills Eyes: No visual changes. ENT: No sore throat. Cardiovascular: Denies chest pain. Respiratory: Denies shortness of breath. Gastrointestinal: No abdominal pain.  No nausea, no vomiting.  No  diarrhea.  No constipation.  Positive for gluteal abscess Genitourinary: Negative for dysuria. Musculoskeletal: Negative for neck pain.  Negative for back pain. Integumentary: Negative for rash. Neurological: Negative for headaches, focal weakness or numbness.   ____________________________________________   PHYSICAL EXAM:  VITAL SIGNS: ED Triage Vitals  Enc Vitals Group     BP 06/30/19 0250 (!) 163/103     Pulse Rate 06/30/19 0250 (!) 120     Resp 06/30/19 0250 17     Temp 06/30/19 0250 98.4 F (36.9 C)     Temp Source 06/30/19 0250 Oral     SpO2 06/30/19 0250 97 %     Weight 06/30/19 0400 101.2 kg (223 lb 1.7 oz)     Height 06/30/19 0400 1.803 m (5' 10.98")     Head Circumference --      Peak Flow --      Pain Score 06/30/19 0250 8     Pain Loc --      Pain Edu? --      Excl. in [place]? --     Constitutional: Alert and oriented.  Apparent discomfort Eyes: Conjunctivae are normal.  Mouth/Throat: Mucous membranes are moist. Neck: No stridor.  No meningeal signs.   Cardiovascular: Normal rate, regular rhythm. Good peripheral circulation. Grossly normal heart sounds. Respiratory: Normal respiratory effort.  No retractions. Gastrointestinal: Soft and nontender. No distention.  Genitourinary: Right gluteal swelling with flocculence medial gluteal line with associated blanching erythema extending to the left gluteal region consistent with cellulitis. Musculoskeletal: No lower  extremity tenderness nor edema. No gross deformities of extremities. Neurologic:  Normal speech and language. No gross focal neurologic deficits are appreciated.  Skin:  Skin is warm, dry and intact. Psychiatric: Mood and affect are normal. Speech and behavior are normal.  ____________________________________________   LABS (all labs ordered are listed, but only abnormal results are displayed)  Labs Reviewed  COMPREHENSIVE METABOLIC PANEL - Abnormal; Notable for the following components:      Result  Value   Glucose, Bld 143 (*)    Total Protein 8.9 (*)    Albumin 3.1 (*)    All other components within normal limits  CBC WITH DIFFERENTIAL/PLATELET - Abnormal; Notable for the following components:   WBC 13.5 (*)    Neutro Abs 10.4 (*)    Monocytes Absolute 1.3 (*)    All other components within normal limits  URINALYSIS, ROUTINE W REFLEX MICROSCOPIC - Abnormal; Notable for the following components:   Color, Urine YELLOW (*)    APPearance CLEAR (*)    Protein, ur 30 (*)    All other components within normal limits  CULTURE, BLOOD (ROUTINE X 2)  CULTURE, BLOOD (ROUTINE X 2)  URINE CULTURE  SARS CORONAVIRUS 2 (TAT 6-24 HRS)  LACTIC ACID, PLASMA  LACTIC ACID, PLASMA  APTT  PROTIME-INR   ____________________________________________  EKG  ED ECG REPORT I, Hornbeck N Mazell Aylesworth, the attending physician, personally viewed and interpreted this ECG.   Date: 06/30/2019  EKG Time: 5:22 AM  Rate: 97  Rhythm:Normal Sinus Rhythm  Axis: normal  Intervals: Normal  ST&T Change: early repolarization St segment elevation  ____________________________________________  RADIOLOGY I, Wilkerson Ernst Bowler, personally viewed and evaluated these images (plain radiographs) as part of my medical decision making, as well as reviewing the written report by the radiologist.  ED MD interpretation:  4.4 x 2.3 x1.5 perianal /right gluteal abscess on CT  Official radiology report(s): Ct Pelvis W Contrast  Result Date: 06/30/2019 CLINICAL DATA:  Gluteal abscess EXAM: CT PELVIS WITH CONTRAST TECHNIQUE: Multidetector CT imaging of the pelvis was performed using the standard protocol following the bolus administration of intravenous contrast. CONTRAST:  160mL OMNIPAQUE IOHEXOL 300 MG/ML  SOLN COMPARISON:  None. FINDINGS: Urinary Tract:  No abnormality visualized. Bowel:  Unremarkable visualized pelvic bowel loops. Vascular/Lymphatic: Enlarged, reactive bilateral inguinal lymph nodes without cavitation  Reproductive: Minimal subcutaneous low-density along the posterior left scrotum without adjacent inflammatory changes, marked on axial images. Other: Gluteal and perianal cellulitic changes with right gluteal abscess measuring 44 x 23 x 15 mm. A small component is seen wrapping anteriorly around to the left aspect of the anus. Musculoskeletal: No acute finding IMPRESSION: 1. Gluteal and perianal cellulitis with 4.4 x 2.3 x 1.5 cm right gluteal subcutaneous abscess. The collection wraps around the anterior to left aspect of the anus. No supralevator inflammation. 2. Minimal subcutaneous low-density at the level of the posterior left scrotum is of indeterminate relationship to #1 as there is no adjacent cutaneous inflammatory changes. Electronically Signed   By: Monte Fantasia M.D.   On: 06/30/2019 05:25    ____________________________________________   PROCEDURES    .Critical Care Performed by: Gregor Hams, MD Authorized by: Gregor Hams, MD   Critical care provider statement:    Critical care time (minutes):  30   Critical care time was exclusive of:  Separately billable procedures and treating other patients   Critical care was necessary to treat or prevent imminent or life-threatening deterioration of the following conditions:  Sepsis  Critical care was time spent personally by me on the following activities:  Development of treatment plan with patient or surrogate, discussions with consultants, evaluation of patient's response to treatment, examination of patient, obtaining history from patient or surrogate, ordering and performing treatments and interventions, ordering and review of laboratory studies, ordering and review of radiographic studies, pulse oximetry, re-evaluation of patient's condition and review of old charts     ____________________________________________   [place] / MDM / [place] / ED COURSE  As part of my medical decision making, I  reviewed the following data within the electronic MEDICAL RECORD NUMBER   43 year old male presented with above-stated history and physical exam secondary to right gluteal/perianal abscess with associated cellulitis.  Concern for possible abscess/cellulitis/Fournier's gangrene and as such CT scan of the pelvis performed which revealed abscess with associated findings consistent with cellulitis.  Sepsis protocol was initiated patient given appropriate IV antibiotic therapy directed toward cellulitis.  Given the extent of the abscess patient will be admitted to the medical staff and as such discussed with Dr. Marcille Blanco for hospital admission for further evaluation and management as well as surgical consultation       ____________________________________________  FINAL CLINICAL IMPRESSION(S) / ED DIAGNOSES  Perianal abscess Right gluteal abscess Gluteal cellulitis  MEDICATIONS GIVEN DURING THIS VISIT:  Medications  vancomycin (VANCOCIN) 2,000 mg in sodium chloride 0.9 % 500 mL IVPB (2,000 mg Intravenous New Bag/Given 06/30/19 0518)  cefTRIAXone (ROCEPHIN) 2 g in sodium chloride 0.9 % 100 mL IVPB (0 g Intravenous Stopped 06/30/19 0516)  iohexol (OMNIPAQUE) 300 MG/ML solution 100 mL (100 mLs Intravenous Contrast Given 06/30/19 0455)  morphine 4 MG/ML injection 4 mg (4 mg Intravenous Given 06/30/19 0551)  ondansetron (ZOFRAN) injection 4 mg (4 mg Intravenous Given 06/30/19 0551)     ED Discharge Orders    None      *Please note:  Curtis Peterson was evaluated in Emergency Department on 06/30/2019 for the symptoms described in the history of present illness. He was evaluated in the context of the global COVID-19 pandemic, which necessitated consideration that the patient might be at risk for infection with the SARS-CoV-2 virus that causes COVID-19. Institutional protocols and algorithms that pertain to the evaluation of patients at risk for COVID-19 are in a state of rapid change based on information  released by regulatory bodies including the CDC and federal and state organizations. These policies and algorithms were followed during the patient's care in the ED.  Some ED evaluations and interventions may be delayed as a result of limited staffing during the pandemic.*  Note:  This document was prepared using Dragon voice recognition software and may include unintentional dictation errors.   Gregor Hams, MD 06/30/19 [phone]

## 2019-06-30 NOTE — Anesthesia Post-op Follow-up Note (Signed)
Anesthesia QCDR form completed.        

## 2019-06-30 NOTE — Transfer of Care (Signed)
Immediate Anesthesia Transfer of Care Note  Patient: Curtis Peterson  Procedure(s) Performed: IRRIGATION AND DEBRIDEMENT PERIRECTAL ABSCESS (N/A )  Patient Location: PACU  Anesthesia Type:General  Level of Consciousness: sedated  Airway & Oxygen Therapy: Patient Spontanous Breathing and Patient connected to face mask oxygen  Post-op Assessment: Report given to RN and Post -op Vital signs reviewed and stable  Post vital signs: Reviewed and stable  Last Vitals:  Vitals Value Taken Time  BP 123/84 06/30/19 1330  Temp 37.2 C 06/30/19 1330  Pulse 92 06/30/19 1338  Resp 16 06/30/19 1338  SpO2 94 % 06/30/19 1338  Vitals shown include unvalidated device data.  Last Pain:  Vitals:   06/30/19 1334  TempSrc:   PainSc: 6       Patients Stated Pain Goal: 3 ([id number])  Complications: No apparent anesthesia complications

## 2019-06-30 NOTE — Op Note (Signed)
  06/30/2019  1:16 PM  PATIENT:  Curtis Peterson  43 y.o. male  PRE-OPERATIVE DIAGNOSIS: complex horseshoe perirectal abscess  POST-OPERATIVE DIAGNOSIS:  Same  PROCEDURE: 1. Incision and drainage of complex perirectal abscess 2. Excisional debridement of skin subcutaneous tissue and muscle      measuring 24 square centimeters 3. Incision and drainage of midline complex scrotal abscess   FINDINGS: Large complex perirectal horseshoe abscess Scrotal abscess Perineal hidradenitis  SURGEON:  Surgeon(s) and Role:    * Pabon, Diego F, MD - Primary   ANESTHESIA: General LMA  INDICATIONS FOR PROCEDURE  abscess  DICTATION:  Patient was explained about the procedure in detail. Risks, benefits and possible complications and a consent was obtained. The patient taken to the operating room and placed in the lithotomy position.  Large horseshoe Abscess with more fluctuance toward the right  Anterolateral aspect. Using a 15 blade knife the abscess was incised and drained. Cultures obtained. Identical steps were taken on the contralateral site, There was no midline communication. We excised skin and subq tissue and using a curette we debrided the muscle and the sub q tissue. Hemostasis obtained with cautery  All the loculations were broken down.  Attention was turned to the scrotum were another small abscess was visualized and using 15 blade knife it was drained and two cc pus were drained. Irrigation with normal saline was performed and the wounds were packed with half-inch packing.  Needle and laparotomy counts were correct and there were no immediate complications  Jules Husbands, MD

## 2019-06-30 NOTE — Progress Notes (Signed)
[place] at [place] NAME: Curtis Peterson    MR#:  [place]:[id number]  DATE OF BIRTH:  01-25-1976  SUBJECTIVE:   Patient presented to the hospital due to pain in his buttocks/rectal area noted to have a perirectal abscess with surrounding cellulitis.  Seen by general surgery and underwent incision and drainage of a complex perirectal abscess with I&D of a midline complex oral abscess and debridement of the subcutaneous tissue.  Patient denies any fevers chills nausea vomiting chest pain shortness of breath or any other associated symptoms.  REVIEW OF SYSTEMS:    Review of Systems  Constitutional: Negative for chills and fever.  HENT: Negative for congestion and tinnitus.   Eyes: Negative for blurred vision and double vision.  Respiratory: Negative for cough, shortness of breath and wheezing.   Cardiovascular: Negative for chest pain, orthopnea and PND.  Gastrointestinal: Negative for abdominal pain, diarrhea, nausea and vomiting.  Genitourinary: Negative for dysuria and hematuria.  Neurological: Negative for dizziness, sensory change and focal weakness.  All other systems reviewed and are negative.   Nutrition: Carb control Tolerating Diet: Yes Tolerating PT: Await Eval.    DRUG ALLERGIES:  No Known Allergies  VITALS:  Blood pressure 122/84, pulse 74, temperature 98.9 F (37.2 C), resp. rate 15, height 5' 10.98" (1.803 m), weight 101.2 kg, SpO2 96 %.  PHYSICAL EXAMINATION:   Physical Exam  GENERAL:  43 y.o.-year-old patient lying in bed in no acute distress.  EYES: Pupils equal, round, reactive to light and accommodation. No scleral icterus. Extraocular muscles intact.  HEENT: Head atraumatic, normocephalic. Oropharynx and nasopharynx clear.  NECK:  Supple, no jugular venous distention. No thyroid enlargement, no tenderness.  LUNGS: Normal breath sounds bilaterally, no wheezing, rales, rhonchi. No use of accessory muscles of  respiration.  CARDIOVASCULAR: S1, S2 normal. No murmurs, rubs, or gallops.  ABDOMEN: Soft, nontender, nondistended. Bowel sounds present. No organomegaly or mass.  EXTREMITIES: No cyanosis, clubbing or edema b/l.    NEUROLOGIC: Cranial nerves II through XII are intact. No focal Motor or sensory deficits b/l.   PSYCHIATRIC: The patient is alert and oriented x 3.  SKIN: No obvious rash, lesion, or ulcer.   GU: Perirectal abscess with significant induration, redness and pain.    LABORATORY PANEL:   CBC Recent Labs  Lab 06/30/19 0433  WBC 13.5*  HGB 14.7  HCT 44.1  PLT 250   ------------------------------------------------------------------------------------------------------------------  Chemistries  Recent Labs  Lab 06/30/19 0433  NA 135  K 4.3  CL 104  CO2 24  GLUCOSE 143*  BUN 13  CREATININE 0.62  CALCIUM 9.7  AST 35  ALT 42  ALKPHOS 84  BILITOT 0.8   ------------------------------------------------------------------------------------------------------------------  Cardiac Enzymes No results for input(s): TROPONINI in the last 168 hours. ------------------------------------------------------------------------------------------------------------------  RADIOLOGY:  Ct Pelvis W Contrast  Result Date: 06/30/2019 CLINICAL DATA:  Gluteal abscess EXAM: CT PELVIS WITH CONTRAST TECHNIQUE: Multidetector CT imaging of the pelvis was performed using the standard protocol following the bolus administration of intravenous contrast. CONTRAST:  144mL OMNIPAQUE IOHEXOL 300 MG/ML  SOLN COMPARISON:  None. FINDINGS: Urinary Tract:  No abnormality visualized. Bowel:  Unremarkable visualized pelvic bowel loops. Vascular/Lymphatic: Enlarged, reactive bilateral inguinal lymph nodes without cavitation Reproductive: Minimal subcutaneous low-density along the posterior left scrotum without adjacent inflammatory changes, marked on axial images. Other: Gluteal and perianal cellulitic changes  with right gluteal abscess measuring 44 x 23 x 15 mm. A small component is seen wrapping anteriorly  around to the left aspect of the anus. Musculoskeletal: No acute finding IMPRESSION: 1. Gluteal and perianal cellulitis with 4.4 x 2.3 x 1.5 cm right gluteal subcutaneous abscess. The collection wraps around the anterior to left aspect of the anus. No supralevator inflammation. 2. Minimal subcutaneous low-density at the level of the posterior left scrotum is of indeterminate relationship to #1 as there is no adjacent cutaneous inflammatory changes. Electronically Signed   By: Monte Fantasia M.D.   On: 06/30/2019 05:25     ASSESSMENT AND PLAN:   43 year old male with past medical history of diabetes, hypertension and presented to the hospital due to pain in the buttocks, rectal area.   1. Peri-rectal abscess with Cellulitis -to the hospital with pain in his buttocks and rectal area and noted to have significant induration and redness in the area. -Seen by general surgery and also underwent CT of the pelvis which showed gluteal and perianal cellulitis with subcutaneous abscess. -Status post incision and drainage of the complex perirectal abscess and also complex scrotal abscess. - Await intraoperative wound cultures. -Continue broad-spectrum IV antibiotics with vancomycin, ceftriaxone, Flagyl for now. -Continue supportive care with pain control.  2.  Leukocytosis-secondary to the cellulitis, follow with IV antibiotic therapy.  3. HTN - cont. Lisinopril.  BP stable.   4. DM Type II - diet controlled and BS stable.    All the records are reviewed and case discussed with Care Management/Social Worker. Management plans discussed with the patient, family and they are in agreement.  CODE STATUS: Full code  DVT Prophylaxis: Lovenox  TOTAL TIME TAKING CARE OF THIS PATIENT: 30 minutes.   POSSIBLE D/C IN 2-3 DAYS, DEPENDING ON CLINICAL CONDITION.   Henreitta Leber M.D on 06/30/2019 at 2:12 PM   Between 7am to 6pm - Pager - [phone]  After 6pm go to [email or web address] - Proofreader  [facility]  Office  [phone]  CC: Primary care physician; Tracie Harrier, MD

## 2019-06-30 NOTE — Consult Note (Signed)
[facility] SURGICAL CONSULTATION NOTE (initial) - [id number]   HISTORY OF PRESENT ILLNESS (HPI):  43 y.o. male presented to [facility] ED today for evaluation of cellulitis of his buttock. Patient reports the onset of left gluteal pain about 2 weeks ago. This was only mildly bothersome at first but has worsened over that time frame. Now a constant sharp and throbbing pain worse with laying on it. He denied any trauma or injury prior to the onset of this pain. No history of similar. No other associated symptoms. Work up in the ED was concerning for sepsis likely attributable to gluteal cellulitis with perianal abscess on imaging.    Surgery is consulted by hospitalist physician Dr. Marcille Blanco, MD in this context for evaluation and management of gluteal cellulitis and perianal abscess.   PAST MEDICAL HISTORY (PMH):  Past Medical History:  Diagnosis Date  . Diabetes mellitus without complication ([place])   . Hypertension      PAST SURGICAL HISTORY ([place]):  History reviewed. No pertinent surgical history.   MEDICATIONS:  Prior to Admission medications   Medication Sig Start Date End Date Taking? Authorizing Provider  lisinopril (ZESTRIL) 2.5 MG tablet Take 2.5 mg by mouth daily.    Yes [provider]  tiZANidine (ZANAFLEX) 2 MG tablet Take 2 mg by mouth 3 (three) times daily as needed. 06/26/19  Yes [provider]  trimethoprim-polymyxin b (POLYTRIM) ophthalmic solution Place 1 drop into the right eye every 4 (four) hours. 06/26/19 07/05/19 Yes [provider]     ALLERGIES:  No Known Allergies   SOCIAL HISTORY:  Social History   Socioeconomic History  . Marital status: Single    Spouse name: Not on file  . Number of children: Not on file  . Years of education: Not on file  . Highest education level: Not on file  Occupational History  . Not on file  Social Needs  . Financial resource strain: Not on file  . Food insecurity    Worry: Not on file     Inability: Not on file  . Transportation needs    Medical: Not on file    Non-medical: Not on file  Tobacco Use  . Smoking status: Current Every Day Smoker  . Smokeless tobacco: Never Used  Substance and Sexual Activity  . Alcohol use: No  . Drug use: No  . Sexual activity: Not on file  Lifestyle  . Physical activity    Days per week: Not on file    Minutes per session: Not on file  . Stress: Not on file  Relationships  . Social Herbalist on phone: Not on file    Gets together: Not on file    Attends religious service: Not on file    Active member of club or organization: Not on file    Attends meetings of clubs or organizations: Not on file    Relationship status: Not on file  . Intimate partner violence    Fear of current or ex partner: Not on file    Emotionally abused: Not on file    Physically abused: Not on file    Forced sexual activity: Not on file  Other Topics Concern  . Not on file  Social History Narrative  . Not on file     FAMILY HISTORY:  No family history on file.    REVIEW OF SYSTEMS:  Review of Systems  Constitutional: Negative for chills and fever.  Respiratory: Negative for cough  and shortness of breath.   Cardiovascular: Negative for chest pain and palpitations.  Gastrointestinal: Negative for abdominal pain, constipation, diarrhea, nausea and vomiting.  Genitourinary: Negative for dysuria and urgency.  Skin:       + Perianal Abscess  All other systems reviewed and are negative.   VITAL SIGNS:  Temp:  [98.4 F (36.9 C)] 98.4 F (36.9 C) (09/22 0250) Pulse Rate:  [96-120] 99 (09/22 0700) Resp:  [15-29] 15 (09/22 0700) BP: (117-163)/(68-103) 117/79 (09/22 0700) SpO2:  [94 %-97 %] 94 % (09/22 0700) Weight:  [101.2 kg] 101.2 kg (09/22 0400)     Height: 5' 10.98" (180.3 cm) Weight: 101.2 kg BMI (Calculated): 31.13   INTAKE/OUTPUT:  09/21 0701 - 09/22 0700 In: 100 [IV Piggyback:100] Out: -   PHYSICAL EXAM:  Physical Exam  Vitals signs and nursing note reviewed.  Constitutional:      General: He is not in acute distress.    Appearance: Normal appearance. He is obese. He is not ill-appearing.  HENT:     Head: Normocephalic and atraumatic.  Eyes:     General: No scleral icterus.    Conjunctiva/sclera: Conjunctivae normal.  Cardiovascular:     Rate and Rhythm: Normal rate and regular rhythm.     Pulses: Normal pulses.     Heart sounds: No murmur. No friction rub. No gallop.   Pulmonary:     Effort: Pulmonary effort is normal. No respiratory distress.     Breath sounds: No wheezing or rhonchi.  Genitourinary:   Musculoskeletal:     Right lower leg: No edema.     Left lower leg: No edema.  Skin:    General: Skin is warm and dry.     Findings: Erythema present.  Neurological:     General: No focal deficit present.     Mental Status: He is alert and oriented to person, place, and time.  Psychiatric:        Mood and Affect: Mood normal.        Behavior: Behavior normal.      Labs:  CBC Latest Ref Rng & Units 06/30/2019 06/02/2017  WBC 4.0 - 10.5 K/uL 13.5(H) 8.3  Hemoglobin 13.0 - 17.0 g/dL 14.7 15.5  Hematocrit 39.0 - 52.0 % 44.1 45.7  Platelets 150 - 400 K/uL 250 232   CMP Latest Ref Rng & Units 06/30/2019 06/02/2017  Glucose 70 - 99 mg/dL 143(H) 157(H)  BUN 6 - 20 mg/dL 13 8  Creatinine 0.61 - 1.24 mg/dL 0.62 0.70  Sodium 135 - 145 mmol/L 135 138  Potassium 3.5 - 5.1 mmol/L 4.3 4.3  Chloride 98 - 111 mmol/L 104 108  CO2 22 - 32 mmol/L 24 23  Calcium 8.9 - 10.3 mg/dL 9.7 9.9  Total Protein 6.5 - 8.1 g/dL 8.9(H) 7.9  Total Bilirubin 0.3 - 1.2 mg/dL 0.8 0.5  Alkaline Phos 38 - 126 U/L 84 83  AST 15 - 41 U/L 35 61(H)  ALT 0 - 44 U/L 42 112(H)     Imaging studies:   CT Pelvis (06/30/2019) personally reviewed which does show gluteal cellulitic changes and fluid collection concerning for abscess, and radiologist report below reviewed:  IMPRESSION: 1. Gluteal and perianal cellulitis with  4.4 x 2.3 x 1.5 cm right gluteal subcutaneous abscess. The collection wraps around the anterior to left aspect of the anus. No supralevator inflammation. 2. Minimal subcutaneous low-density at the level of the posterior left scrotum is of indeterminate relationship to #1 as there is no  adjacent cutaneous inflammatory changes.   Assessment/Plan: (ICD-10's: L03.31) 43 y.o. male with leukocytosis and left perirectal pain attributable to gluteal cellulitis and perianal abscess, complicated by history of DM   - Admit to medical service  - NPO  - IV ABx (Vancomycin, Rocephin)  - pain control prn  - Will plan on I&D in the OR with Dr Dahlia Byes this afternoon pending OR/Anesthesia availability  - All risks, benefits, and alternatives to above procedure(s) were discussed with the patient, all of his questions were answered to his expressed satisfaction, patient expresses he wishes to proceed, and informed consent was obtained.   - medical management of comorbidities   All of the above findings and recommendations were discussed with the patient, and all of patient's questions were answered to his expressed satisfaction.  Thank you for the opportunity to participate in this patient's care.   -- Edison Simon, PA-C [facility] 06/30/2019, 7:38 AM [phone] M-F: 7am - 4pm

## 2019-06-30 NOTE — Progress Notes (Signed)
PHARMACY -  BRIEF ANTIBIOTIC NOTE   Pharmacy has received consult(s) for vancomycin from an ED provider.  The patient's profile has been reviewed for ht/wt/allergies/indication/available labs.    One time order(s) placed for vancomycin 2g IV load  Further antibiotics/pharmacy consults should be ordered by admitting physician if indicated.                       Thank you,  Tobie Lords, PharmD, BCPS Clinical Pharmacist 06/30/2019  4:25 AM

## 2019-06-30 NOTE — Consult Note (Signed)
Pharmacy Antibiotic Note  Curtis Peterson is a 43 y.o. male admitted on 06/30/2019 with cellulitis/perianal abscess.  Pharmacy has been consulted for vancomycin dosing.  Plan: Vancomycin 2000mg  IV LD x 1. Start Vancomycin 1500 mg IV Q 12 hrs. Goal AUC 400-550. Expected AUC: 530 SCr used: 0.8  Height: 5' 10.98" (180.3 cm) Weight: 223 lb 1.7 oz (101.2 kg) IBW/kg (Calculated) : 75.26  Temp (24hrs), Avg:98.4 F (36.9 C), Min:98.4 F (36.9 C), Max:98.4 F (36.9 C)  Recent Labs  Lab 06/30/19 0433 06/30/19 0627  WBC 13.5*  --   CREATININE 0.62  --   LATICACIDVEN 0.8 0.5    Estimated Creatinine Clearance: 147.3 mL/min (by C-G formula based on SCr of 0.62 mg/dL).    No Known Allergies  Antimicrobials this admission: 9/22 ceftriaxone >>  9/22 vancomycin  >>   Microbiology results: 9/22 BCx: pending 9/22 UCx: pending  Thank you for allowing pharmacy to be a part of this patient's care.  Pernell Dupre, PharmD, BCPS Clinical Pharmacist 06/30/2019 7:25 AM

## 2019-07-01 ENCOUNTER — Encounter: Payer: Self-pay | Admitting: Internal Medicine

## 2019-07-01 LAB — BASIC METABOLIC PANEL
Anion gap: 4 — ABNORMAL LOW (ref 5–15)
BUN: 7 mg/dL (ref 6–20)
CO2: 26 mmol/L (ref 22–32)
Calcium: 9.4 mg/dL (ref 8.9–10.3)
Chloride: 107 mmol/L (ref 98–111)
Creatinine, Ser: 0.45 mg/dL — ABNORMAL LOW (ref 0.61–1.24)
GFR calc Af Amer: >60 mL/min (ref >60)
GFR calc non Af Amer: >60 mL/min (ref >60)
Glucose, Bld: 160 mg/dL — ABNORMAL HIGH (ref 70–99)
Potassium: 4.1 mmol/L (ref 3.5–5.1)
Sodium: 137 mmol/L (ref 135–145)

## 2019-07-01 LAB — URINE CULTURE: Culture: <10000 — AB

## 2019-07-01 LAB — CBC
HCT: 38.8 % — ABNORMAL LOW (ref 39.0–52.0)
Hemoglobin: 12.5 g/dL — ABNORMAL LOW (ref 13.0–17.0)
MCH: 32.4 pg (ref 26.0–34.0)
MCHC: 32.2 g/dL (ref 30.0–36.0)
MCV: 100.5 fL — ABNORMAL HIGH (ref 80.0–100.0)
Platelets: 221 10*3/uL (ref 150–400)
RBC: 3.86 MIL/uL — ABNORMAL LOW (ref 4.22–5.81)
RDW: 13.3 % (ref 11.5–15.5)
WBC: 11.7 10*3/uL — ABNORMAL HIGH (ref 4.0–10.5)
nRBC: 0 % (ref 0.0–0.2)

## 2019-07-01 LAB — HEMOGLOBIN A1C
Hgb A1c MFr Bld: 6 % — ABNORMAL HIGH (ref 4.8–5.6)
Mean Plasma Glucose: 126 mg/dL

## 2019-07-01 LAB — SURGICAL PATHOLOGY

## 2019-07-01 MED ORDER — OXYCODONE HCL 5 MG PO TABS
5.0000 mg | ORAL_TABLET | ORAL | Status: DC | PRN
  Administered 2019-07-01 – 2019-07-03 (×8): 10 mg via ORAL
  Filled 2019-07-01 (×8): qty 2

## 2019-07-01 NOTE — Progress Notes (Signed)
[place] at [place] NAME: Audra Mcsweeney    MR#:  [id number]  DATE OF BIRTH:  10-17-1975  SUBJECTIVE:   Patient is status post incision and drainage of a perirectal abscess.  Pain is much improved in the area.  Afebrile, white cell count has lowered.  No other acute complaints presently.  REVIEW OF SYSTEMS:    Review of Systems  Constitutional: Negative for chills and fever.  HENT: Negative for congestion and tinnitus.   Eyes: Negative for blurred vision and double vision.  Respiratory: Negative for cough, shortness of breath and wheezing.   Cardiovascular: Negative for chest pain, orthopnea and PND.  Gastrointestinal: Negative for abdominal pain, diarrhea, nausea and vomiting.  Genitourinary: Negative for dysuria and hematuria.  Neurological: Negative for dizziness, sensory change and focal weakness.  All other systems reviewed and are negative.   Nutrition: Carb control Tolerating Diet: Yes Tolerating PT: Ambulatory   DRUG ALLERGIES:  No Known Allergies  VITALS:  Blood pressure 107/71, pulse (!) 55, temperature (!) 97.5 F (36.4 C), temperature source Oral, resp. rate 18, height 5' 10.98" (1.803 m), weight 101.2 kg, SpO2 98 %.  PHYSICAL EXAMINATION:   Physical Exam  GENERAL:  43 y.o.-year-old patient lying in bed in no acute distress.  EYES: Pupils equal, round, reactive to light and accommodation. No scleral icterus. Extraocular muscles intact.  HEENT: Head atraumatic, normocephalic. Oropharynx and nasopharynx clear.  NECK:  Supple, no jugular venous distention. No thyroid enlargement, no tenderness.  LUNGS: Normal breath sounds bilaterally, no wheezing, rales, rhonchi. No use of accessory muscles of respiration.  CARDIOVASCULAR: S1, S2 normal. No murmurs, rubs, or gallops.  ABDOMEN: Soft, nontender, nondistended. Bowel sounds present. No organomegaly or mass.  EXTREMITIES: No cyanosis, clubbing or edema b/l.     NEUROLOGIC: Cranial nerves II through XII are intact. No focal Motor or sensory deficits b/l.   PSYCHIATRIC: The patient is alert and oriented x 3.  SKIN: No obvious rash, lesion, or ulcer.   GU:s/p I & D of the peri-rectal abscess.        LABORATORY PANEL:   CBC Recent Labs  Lab 07/01/19 0554  WBC 11.7*  HGB 12.5*  HCT 38.8*  PLT 221   ------------------------------------------------------------------------------------------------------------------  Chemistries  Recent Labs  Lab 06/30/19 0433 07/01/19 0554  NA 135 137  K 4.3 4.1  CL 104 107  CO2 24 26  GLUCOSE 143* 160*  BUN 13 7  CREATININE 0.62 0.45*  CALCIUM 9.7 9.4  AST 35  --   ALT 42  --   ALKPHOS 84  --   BILITOT 0.8  --    ------------------------------------------------------------------------------------------------------------------  Cardiac Enzymes No results for input(s): TROPONINI in the last 168 hours. ------------------------------------------------------------------------------------------------------------------  RADIOLOGY:  Ct Pelvis W Contrast  Result Date: 06/30/2019 CLINICAL DATA:  Gluteal abscess EXAM: CT PELVIS WITH CONTRAST TECHNIQUE: Multidetector CT imaging of the pelvis was performed using the standard protocol following the bolus administration of intravenous contrast. CONTRAST:  126mL OMNIPAQUE IOHEXOL 300 MG/ML  SOLN COMPARISON:  None. FINDINGS: Urinary Tract:  No abnormality visualized. Bowel:  Unremarkable visualized pelvic bowel loops. Vascular/Lymphatic: Enlarged, reactive bilateral inguinal lymph nodes without cavitation Reproductive: Minimal subcutaneous low-density along the posterior left scrotum without adjacent inflammatory changes, marked on axial images. Other: Gluteal and perianal cellulitic changes with right gluteal abscess measuring 44 x 23 x 15 mm. A small component is seen wrapping anteriorly around to the left aspect of the anus. Musculoskeletal:  No acute finding  IMPRESSION: 1. Gluteal and perianal cellulitis with 4.4 x 2.3 x 1.5 cm right gluteal subcutaneous abscess. The collection wraps around the anterior to left aspect of the anus. No supralevator inflammation. 2. Minimal subcutaneous low-density at the level of the posterior left scrotum is of indeterminate relationship to #1 as there is no adjacent cutaneous inflammatory changes. Electronically Signed   By: Monte Fantasia M.D.   On: 06/30/2019 05:25     ASSESSMENT AND PLAN:   43 year old male with past medical history of diabetes, hypertension and presented to the hospital due to pain in the buttocks, rectal area.   1. Peri-rectal abscess with Cellulitis - presented to the hospital with pain in his buttocks and rectal area and noted to have significant induration and redness in the area. -Seen by general surgery and also underwent CT of the pelvis which showed gluteal and perianal cellulitis with subcutaneous abscess. -Status post incision and drainage of the complex perirectal abscess and also complex scrotal abscess. -Await intraoperative wound cultures which currently growing gram-positive cocci and gram-negative rod.  Continue broad-spectrum IV antibiotics with vancomycin, Ceftriaxone, Flagyl.  Clinically improved today. Continue local wound care with wet-to-dry dressings twice daily as needed  2.  Leukocytosis-secondary to the cellulitis, follow with IV antibiotic therapy and trending down.   3. HTN - cont. Lisinopril.  BP stable.   4. DM Type II - diet controlled and BS stable.    All the records are reviewed and case discussed with Care Management/Social Worker. Management plans discussed with the patient, family and they are in agreement.  CODE STATUS: Full code  DVT Prophylaxis: Lovenox  TOTAL TIME TAKING CARE OF THIS PATIENT: 25 minutes.   POSSIBLE D/C IN 2-3 DAYS, DEPENDING ON CLINICAL CONDITION.   Henreitta Leber M.D on 07/01/2019 at 1:02 PM  Between 7am to 6pm - Pager -  [phone]  After 6pm go to [email or web address] - Proofreader  [facility]  Office  [phone]  CC: Primary care physician; Tracie Harrier, MD

## 2019-07-01 NOTE — Anesthesia Postprocedure Evaluation (Signed)
Anesthesia Post Note  Patient: Curtis Peterson  Procedure(s) Performed: IRRIGATION AND DEBRIDEMENT PERIRECTAL ABSCESS (N/A )  Patient location during evaluation: PACU Anesthesia Type: General Level of consciousness: awake and alert and oriented Pain management: pain level controlled Vital Signs Assessment: post-procedure vital signs reviewed and stable Respiratory status: spontaneous breathing, nonlabored ventilation and respiratory function stable Cardiovascular status: blood pressure returned to baseline and stable Postop Assessment: no signs of nausea or vomiting Anesthetic complications: no     Last Vitals:  Vitals:   07/01/19 0538 07/01/19 0810  BP: 96/63 106/67  Pulse: (!) 56 (!) 53  Resp: 18   Temp: (!) 36.3 C   SpO2: 95%     Last Pain:  Vitals:   07/01/19 0811  TempSrc:   PainSc: 7                  Anastyn Ayars

## 2019-07-01 NOTE — Progress Notes (Signed)
[facility] Hospital Day(s): 1.   Post op day(s): 1 Day Post-Op.   Interval History: Patient seen and examined, no acute events or new complaints overnight. Patient reports that his pain is improved compared to yesterday. He notes at worst it is a 5/10 throbbing pain improved with medications. No fever or chills. Leukocytosis is improved. Cultures from OR pending. No other issues.   Vital signs in last 24 hours: [min-max] current  Temp:  [97.4 F (36.3 C)-99.4 F (37.4 C)] 97.4 F (36.3 C) (09/23 0538) Pulse Rate:  [56-103] 56 (09/23 0538) Resp:  [12-21] 18 (09/23 0538) BP: (96-144)/(63-84) 96/63 (09/23 0538) SpO2:  [92 %-97 %] 95 % (09/23 0538)     Height: 5' 10.98" (180.3 cm) Weight: 101.2 kg BMI (Calculated): 31.13   Intake/Output last 2 shifts:  09/22 0701 - 09/23 0700 In: [id number] [P.O.:1090; I.V.:950; IV Piggyback:2508] Out: 1760 [Urine:1750; Blood:10]   Physical Exam:  Constitutional: alert, cooperative and no distress  Respiratory: breathing non-labored at rest  Integumentary: 3 debridement site to the inferior scrotum, left and right gluteal creases, no involvement in rectum. These areas are indurated, improved swelling and erythema, wound bases are pink without necrosis or purulent drainage.   07/01/2019 - 9:54 AM - WOUNDS:       Labs:  CBC Latest Ref Rng & Units 07/01/2019 06/30/2019 06/02/2017  WBC 4.0 - 10.5 K/uL 11.7(H) 13.5(H) 8.3  Hemoglobin 13.0 - 17.0 g/dL 12.5(L) 14.7 15.5  Hematocrit 39.0 - 52.0 % 38.8(L) 44.1 45.7  Platelets 150 - 400 K/uL 221 250 232   CMP Latest Ref Rng & Units 07/01/2019 06/30/2019 06/02/2017  Glucose 70 - 99 mg/dL 160(H) 143(H) 157(H)  BUN 6 - 20 mg/dL 7 13 8   Creatinine 0.61 - 1.24 mg/dL 0.45(L) 0.62 0.70  Sodium 135 - 145 mmol/L 137 135 138  Potassium 3.5 - 5.1 mmol/L 4.1 4.3 4.3  Chloride 98 - 111 mmol/L 107 104 108  CO2 22 - 32 mmol/L 26 24 23   Calcium 8.9 - 10.3 mg/dL 9.4 9.7 9.9  Total  Protein 6.5 - 8.1 g/dL - 8.9(H) 7.9  Total Bilirubin 0.3 - 1.2 mg/dL - 0.8 0.5  Alkaline Phos 38 - 126 U/L - 84 83  AST 15 - 41 U/L - 35 61(H)  ALT 0 - 44 U/L - 42 112(H)    Imaging studies: No new pertinent imaging studies   Assessment/Plan:  43 y.o. male with improvement in pain and leukocytosis 1 Day Post-Op s/p incision and debridement for complex horseshoe perirectal abscess and abscess of scrotum, complicated by pertinent comorbidities including DM.   - Continue regular diet + D/C IVF  - Continue IV ABx (Vancomycin + Zosyn); follow up cultures from OR   - pain control prn  - Completed wet to dry dressing change this morning; continue BID prn  - No indication for further surgical intervention or debridement at this time: will follow  - medical management of comorbidities; SSI   All of the above findings and recommendations were discussed with the patient, and the medical team, and all of patient's questions were answered to his expressed satisfaction.  -- Edison Simon, PA-C [facility] 07/01/2019, 7:50 AM [phone] M-F: 7am - 4pm

## 2019-07-02 LAB — CBC
HCT: 37 % — ABNORMAL LOW (ref 39.0–52.0)
Hemoglobin: 11.8 g/dL — ABNORMAL LOW (ref 13.0–17.0)
MCH: 32.5 pg (ref 26.0–34.0)
MCHC: 31.9 g/dL (ref 30.0–36.0)
MCV: 101.9 fL — ABNORMAL HIGH (ref 80.0–100.0)
Platelets: 213 10*3/uL (ref 150–400)
RBC: 3.63 MIL/uL — ABNORMAL LOW (ref 4.22–5.81)
RDW: 13.6 % (ref 11.5–15.5)
WBC: 7.2 10*3/uL (ref 4.0–10.5)
nRBC: 0 % (ref 0.0–0.2)

## 2019-07-02 LAB — VANCOMYCIN, PEAK: Vancomycin Pk: 21 ug/mL — ABNORMAL LOW (ref 30–40)

## 2019-07-02 NOTE — Progress Notes (Signed)
[place] at [place] NAME: Curtis Peterson    MR#:  [id number]  DATE OF BIRTH:  1976/05/02  SUBJECTIVE:   Patient is status post incision and drainage of a perirectal abscess POD # 2.  Pt. Feels much better and pain has improved since admission.   REVIEW OF SYSTEMS:    Review of Systems  Constitutional: Negative for chills and fever.  HENT: Negative for congestion and tinnitus.   Eyes: Negative for blurred vision and double vision.  Respiratory: Negative for cough, shortness of breath and wheezing.   Cardiovascular: Negative for chest pain, orthopnea and PND.  Gastrointestinal: Negative for abdominal pain, diarrhea, nausea and vomiting.  Genitourinary: Negative for dysuria and hematuria.  Neurological: Negative for dizziness, sensory change and focal weakness.  All other systems reviewed and are negative.   Nutrition: Carb control Tolerating Diet: Yes Tolerating PT: Ambulatory   DRUG ALLERGIES:  No Known Allergies  VITALS:  Blood pressure 112/82, pulse 65, temperature 99 F (37.2 C), temperature source Oral, resp. rate 18, height 5' 10.98" (1.803 m), weight 101.2 kg, SpO2 96 %.  PHYSICAL EXAMINATION:   Physical Exam  GENERAL:  43 y.o.-year-old patient lying in bed in no acute distress.  EYES: Pupils equal, round, reactive to light and accommodation. No scleral icterus. Extraocular muscles intact.  HEENT: Head atraumatic, normocephalic. Oropharynx and nasopharynx clear.  NECK:  Supple, no jugular venous distention. No thyroid enlargement, no tenderness.  LUNGS: Normal breath sounds bilaterally, no wheezing, rales, rhonchi. No use of accessory muscles of respiration.  CARDIOVASCULAR: S1, S2 normal. No murmurs, rubs, or gallops.  ABDOMEN: Soft, nontender, nondistended. Bowel sounds present. No organomegaly or mass.  EXTREMITIES: No cyanosis, clubbing or edema b/l.    NEUROLOGIC: Cranial nerves II through XII are intact. No  focal Motor or sensory deficits b/l.   PSYCHIATRIC: The patient is alert and oriented x 3.  SKIN: No obvious rash, lesion, or ulcer.   GU:s/p I & D of the peri-rectal abscess.          LABORATORY PANEL:   CBC Recent Labs  Lab 07/02/19 0352  WBC 7.2  HGB 11.8*  HCT 37.0*  PLT 213   ------------------------------------------------------------------------------------------------------------------  Chemistries  Recent Labs  Lab 06/30/19 0433 07/01/19 0554  NA 135 137  K 4.3 4.1  CL 104 107  CO2 24 26  GLUCOSE 143* 160*  BUN 13 7  CREATININE 0.62 0.45*  CALCIUM 9.7 9.4  AST 35  --   ALT 42  --   ALKPHOS 84  --   BILITOT 0.8  --    ------------------------------------------------------------------------------------------------------------------  Cardiac Enzymes No results for input(s): TROPONINI in the last 168 hours. ------------------------------------------------------------------------------------------------------------------  RADIOLOGY:  No results found.   ASSESSMENT AND PLAN:   43 year old male with past medical history of diabetes, hypertension and presented to the hospital due to pain in the buttocks, rectal area.   1. Peri-rectal abscess with Cellulitis - presented to the hospital with pain in his buttocks and rectal area and noted to have significant induration and redness in the area. -Seen by general surgery and also underwent CT of the pelvis which showed gluteal and perianal cellulitis with subcutaneous abscess. -Status post incision and drainage of the complex perirectal abscess and also complex scrotal abscess POD # 1 today. Pain much improved.  -Await intraoperative wound cultures which currently growing gram-positive cocci and gram-negative rod.  Continue broad-spectrum IV antibiotics with vancomycin, Zosyn.  - Continue  local wound care with wet-to-dry dressings twice daily as needed  2.  Leukocytosis-secondary to the cellulitis -  resolved with IV abx and I & D.    3. HTN - cont. Lisinopril.  BP stable.   4. DM Type II - diet controlled and BS stable.    All the records are reviewed and case discussed with Care Management/Social Worker. Management plans discussed with the patient, family and they are in agreement.  CODE STATUS: Full code  DVT Prophylaxis: Lovenox  TOTAL TIME TAKING CARE OF THIS PATIENT: 25 minutes.   POSSIBLE D/C IN 1-2 DAYS, DEPENDING ON CLINICAL CONDITION.   Henreitta Leber M.D on 07/02/2019 at 2:47 PM  Between 7am to 6pm - Pager - [phone]  After 6pm go to [email or web address] - Proofreader  [facility]  Office  [phone]  CC: Primary care physician; Tracie Harrier, MD

## 2019-07-02 NOTE — Progress Notes (Signed)
[facility] Day(s): 2.   Post op day(s): 2 Days Post-Op.   Interval History: Patient seen and examined, no acute events or new complaints overnight. Patient reports his pain is improved this morning. Still with some peri-rectal soreness. No fever or chills. Leukocytosis resolved. Still awaiting final culture results.    Vital signs in last 24 hours: [min-max] current  Temp:  [97.5 F (36.4 C)-98.9 F (37.2 C)] 98.9 F (37.2 C) (09/24 0816) Pulse Rate:  [55-64] 64 (09/24 0816) Resp:  [16-20] 18 (09/24 0816) BP: (89-129)/(62-88) 129/88 (09/24 0816) SpO2:  [95 %-98 %] 96 % (09/24 0816)     Height: 5' 10.98" (180.3 cm) Weight: 101.2 kg BMI (Calculated): 31.13   Intake/Output last 2 shifts:  09/23 0701 - 09/24 0700 In: 2044.1 [P.O.:480; I.V.:1368.5; IV Piggyback:195.6] Out: 2200 [Urine:2200]   Physical Exam:  Constitutional: alert, cooperative and no distress  Respiratory: breathing non-labored at rest  Integumentary: 3 debridement site to the inferior scrotum, left and right gluteal creases, no involvement in rectum. These areas are indurated, improved swelling and erythema, wound bases are pink without necrosis or purulent drainage.   Wound (09/24)      Labs:  CBC Latest Ref Rng & Units 07/02/2019 07/01/2019 06/30/2019  WBC 4.0 - 10.5 K/uL 7.2 11.7(H) 13.5(H)  Hemoglobin 13.0 - 17.0 g/dL 11.8(L) 12.5(L) 14.7  Hematocrit 39.0 - 52.0 % 37.0(L) 38.8(L) 44.1  Platelets 150 - 400 K/uL 213 221 250   CMP Latest Ref Rng & Units 07/01/2019 06/30/2019 06/02/2017  Glucose 70 - 99 mg/dL 160(H) 143(H) 157(H)  BUN 6 - 20 mg/dL 7 13 8   Creatinine 0.61 - 1.24 mg/dL 0.45(L) 0.62 0.70  Sodium 135 - 145 mmol/L 137 135 138  Potassium 3.5 - 5.1 mmol/L 4.1 4.3 4.3  Chloride 98 - 111 mmol/L 107 104 108  CO2 22 - 32 mmol/L 26 24 23   Calcium 8.9 - 10.3 mg/dL 9.4 9.7 9.9  Total Protein 6.5 - 8.1 g/dL - 8.9(H) 7.9  Total Bilirubin 0.3 - 1.2 mg/dL -  0.8 0.5  Alkaline Phos 38 - 126 U/L - 84 83  AST 15 - 41 U/L - 35 61(H)  ALT 0 - 44 U/L - 42 112(H)     Imaging studies: No new pertinent imaging studies   Assessment/Plan:  43 y.o. male doing well 2 Days Post-Op s/p incision and debridement for complex horseshoe perirectalabscess and abscess of scrotum, complicated by pertinent comorbidities including DM.   - Continue regular diet             - Continue IV ABx (Vancomycin + Zosyn); follow up cultures from OR              - pain control prn             - Completed wet to dry dressing change this morning; continue BID prn             - No indication for further surgical intervention or debridement at this time: will follow             - medical management of comorbidities; SSI   All of the above findings and recommendations were discussed with the patient, patient's family (girlfriend, and the medical team, and all of patient's and family's questions were answered to their expressed satisfaction.  -- Edison Simon, PA-C [facility] 07/02/2019, 8:30 AM [phone] M-F: 7am - 4pm

## 2019-07-03 LAB — VANCOMYCIN, TROUGH: Vancomycin Tr: 14 ug/mL — ABNORMAL LOW (ref 15–20)

## 2019-07-03 MED ORDER — METRONIDAZOLE 500 MG PO TABS: 500.0000 mg | ORAL_TABLET | Freq: Three times a day (TID) | ORAL | 0 refills | Status: AC

## 2019-07-03 MED ORDER — SULFAMETHOXAZOLE-TRIMETHOPRIM 800-160 MG PO TABS: 1.0000 | ORAL_TABLET | Freq: Two times a day (BID) | ORAL | 0 refills | Status: AC

## 2019-07-03 MED ORDER — VANCOMYCIN HCL 10 G IV SOLR
1750.0000 mg | Freq: Two times a day (BID) | INTRAVENOUS | Status: DC
  Filled 2019-07-03: qty 1750

## 2019-07-03 MED ORDER — FLUTICASONE PROPIONATE 50 MCG/ACT NA SUSP: 2.0000 | Freq: Every day | NASAL | 0 refills | Status: DC

## 2019-07-03 NOTE — Care Management (Signed)
Patient to discharge with wet to dry dressing changes.  MD had placed order for daily home health dressings.  Patient does not qualify for home health as he is no homebound.  Home health also does not provide daily dressing changes.  While inpatient patient has been declining for nurses to change his dressings, and requesting to change it himself.  MD notified  Bedside RN to provide dressing education.  And provide dressing change supplies at discharge.

## 2019-07-03 NOTE — Consult Note (Signed)
Pharmacy Antibiotic Note  Curtis Peterson is a 43 y.o. male admitted on 06/30/2019 with cellulitis/perianal abscess.  Pharmacy has been consulted for vancomycin dosing.  Vancomycin Dosing History: 9/22 @ 0518 2000mg  9/22 @ 1756 1500mg  9/23 @ 0506 1500mg  9/23 @ 1710 1500mg  9/24 @ 0638 1500mg  9/24 @ 1753 1500mg  9/24 @ 2218 VP = 21 ug/ml 9/25 @ 0533 VT = 14 ug/ml  Plan: Will update dosing from calculated 2 level calculator to   Vancomycin 1750 mg IV Q 12 hrs. Goal AUC 400-550. Expected AUC: 525 Expected Cmax: 28.4 Cmin 16.3  Continue Zosyn 3.375g q8h (EI dosing) as scheduled  Height: 5' 10.98" (180.3 cm) Weight: 238 lb 14.4 oz (108.4 kg) IBW/kg (Calculated) : 75.26  Temp (24hrs), Avg:98.3 F (36.8 C), Min:97.4 F (36.3 C), Max:99 F (37.2 C)  Recent Labs  Lab 06/30/19 0433 06/30/19 0627 07/01/19 0554 07/02/19 0352 07/02/19 2218 07/03/19 0533  WBC 13.5*  --  11.7* 7.2  --   --   CREATININE 0.62  --  0.45*  --   --   --   LATICACIDVEN 0.8 0.5  --   --   --   --   VANCOTROUGH  --   --   --   --   --  14*  VANCOPEAK  --   --   --   --  21*  --     Estimated Creatinine Clearance: 152.1 mL/min (A) (by C-G formula based on SCr of 0.45 mg/dL (L)).    No Known Allergies  Antimicrobials this admission: 9/22 ceftriaxone/metronidazole x 1 9/22 zosyn >> 9/22 vancomycin  >>   Microbiology results: 9/22 BCx: NG x 3 days 9/22 UCx: insignificant growth 9/22 Wound culture Abundant GPC, rare GNR's  Thank you for allowing pharmacy to be a part of this patient's care.  Lu Duffel, PharmD, BCPS Clinical Pharmacist 07/03/2019 7:38 AM

## 2019-07-03 NOTE — Discharge Summary (Signed)
[place] [facility] [place] NAME: Curtis Peterson    MR#:  [id number]  DATE OF BIRTH:  10/02/1976  DATE OF ADMISSION:  06/30/2019 ADMITTING PHYSICIAN: Henreitta Leber, MD  DATE OF DISCHARGE: 07/03/2019  2:03 PM  PRIMARY CARE PHYSICIAN: Tracie Harrier, MD    ADMISSION DIAGNOSIS:  Cellulitis and abscess of buttock [L02.31, [id number]  DISCHARGE DIAGNOSIS:  Active Problems:   Abscess and cellulitis of gluteal region   Cellulitis and abscess of buttock   SECONDARY DIAGNOSIS:   Past Medical History:  Diagnosis Date  . Diabetes mellitus without complication ([place])   . Hypertension     HOSPITAL COURSE:   43 year old male with past medical history of diabetes, hypertension and presented to the hospital due to pain in the buttocks, rectal area.   1. Peri-rectal abscess with Cellulitis - presented to the hospital with pain in his buttocks and rectal area and noted to have significant induration and redness in the area. -Seen by general surgery and also underwent CT of the pelvis which showed gluteal and perianal cellulitis with subcutaneous abscess. -Status post incision and drainage of the complex perirectal abscess and also complex scrotal abscess POD # 3 today. -Patient's pain is significantly improved since admission.  Intraoperative wound cultures are growing gram-positive cocci and gram-negative rod. -Patient will be discharged home today on oral Bactrim and Flagyl with follow-up with general surgery in 1 week. -Patient will continue local wound care with wet-to-dry dressings twice daily as needed.  Patient's wife was instructed on how to do that at home.  2.  Leukocytosis-secondary to the cellulitis - resolved with IV abx and I & D.    3. HTN - pt. Will cont. Lisinopril.  BP stable.   4. DM Type II - diet controlled and BS stable.   DISCHARGE CONDITIONS:   Stable.   CONSULTS OBTAINED:  Treatment Team:  Jules Husbands, MD  DRUG  ALLERGIES:  No Known Allergies  DISCHARGE MEDICATIONS:   Allergies as of 07/03/2019   No Known Allergies     Medication List    TAKE these medications   fluticasone 50 MCG/ACT nasal spray Commonly known as: Flonase Place 2 sprays into both nostrils daily.   lisinopril 2.5 MG tablet Commonly known as: ZESTRIL Take 2.5 mg by mouth daily.   metroNIDAZOLE 500 MG tablet Commonly known as: FLAGYL Take 1 tablet (500 mg total) by mouth 3 (three) times daily for 10 days.   sulfamethoxazole-trimethoprim 800-160 MG tablet Commonly known as: BACTRIM DS Take 1 tablet by mouth 2 (two) times daily for 10 days.   tiZANidine 2 MG tablet Commonly known as: ZANAFLEX Take 2 mg by mouth 3 (three) times daily as needed.   trimethoprim-polymyxin b ophthalmic solution Commonly known as: POLYTRIM Place 1 drop into the right eye every 4 (four) hours.         DISCHARGE INSTRUCTIONS:   DIET:  Cardiac diet and Diabetic diet  DISCHARGE CONDITION:  Stable  ACTIVITY:  Activity as tolerated  OXYGEN:  Home Oxygen: No.   Oxygen Delivery: room air  DISCHARGE LOCATION:  home   If you experience worsening of your admission symptoms, develop shortness of breath, life threatening emergency, suicidal or homicidal thoughts you must seek medical attention immediately by calling 911 or calling your MD immediately  if symptoms less severe.  You Must read complete instructions/literature along with all the possible adverse reactions/side effects for all the Medicines you take and that have  been prescribed to you. Take any new Medicines after you have completely understood and accpet all the possible adverse reactions/side effects.   Please note  You were cared for by a hospitalist during your hospital stay. If you have any questions about your discharge medications or the care you received while you were in the hospital after you are discharged, you can call the unit and asked to speak with the  hospitalist on call if the hospitalist that took care of you is not available. Once you are discharged, your primary care physician will handle any further medical issues. Please note that NO REFILLS for any discharge medications will be authorized once you are discharged, as it is imperative that you return to your primary care physician (or establish a relationship with a primary care physician if you do not have one) for your aftercare needs so that they can reassess your need for medications and monitor your lab values.     Today    VITAL SIGNS:  Blood pressure 117/85, pulse (!) 53, temperature 98.5 F (36.9 C), temperature source Oral, resp. rate 16, height 5' 10.98" (1.803 m), weight 108.4 kg, SpO2 97 %.  I/O:    Intake/Output Summary (Last 24 hours) at 07/03/2019 1538 Last data filed at 07/03/2019 [id number] Gross per 24 hour  Intake 2798.48 ml  Output -  Net 2798.48 ml    PHYSICAL EXAMINATION:   GENERAL:  43 y.o.-year-old patient lying in bed in no acute distress.  EYES: Pupils equal, round, reactive to light and accommodation. No scleral icterus. Extraocular muscles intact.  HEENT: Head atraumatic, normocephalic. Oropharynx and nasopharynx clear.  NECK:  Supple, no jugular venous distention. No thyroid enlargement, no tenderness.  LUNGS: Normal breath sounds bilaterally, no wheezing, rales, rhonchi. No use of accessory muscles of respiration.  CARDIOVASCULAR: S1, S2 normal. No murmurs, rubs, or gallops.  ABDOMEN: Soft, nontender, nondistended. Bowel sounds present. No organomegaly or mass.  EXTREMITIES: No cyanosis, clubbing or edema b/l.    NEUROLOGIC: Cranial nerves II through XII are intact. No focal Motor or sensory deficits b/l.   PSYCHIATRIC: The patient is alert and oriented x 3.  SKIN: No obvious rash, lesion, or ulcer.   GU:s/p I & D of the peri-rectal abscess.   DATA REVIEW:   CBC Recent Labs  Lab 07/02/19 0352  WBC 7.2  HGB 11.8*  HCT 37.0*  PLT 213     Chemistries  Recent Labs  Lab 06/30/19 0433 07/01/19 0554  NA 135 137  K 4.3 4.1  CL 104 107  CO2 24 26  GLUCOSE 143* 160*  BUN 13 7  CREATININE 0.62 0.45*  CALCIUM 9.7 9.4  AST 35  --   ALT 42  --   ALKPHOS 84  --   BILITOT 0.8  --     Cardiac Enzymes No results for input(s): TROPONINI in the last 168 hours.  Microbiology Results  Results for orders placed or performed during the hospital encounter of 06/30/19  Blood Culture (routine x 2)     Status: None (Preliminary result)   Collection Time: 06/30/19  4:33 AM   Specimen: BLOOD  Result Value Ref Range Status   Specimen Description BLOOD RIGHT [facility]  Final   Special Requests   Final    BOTTLES DRAWN AEROBIC AND ANAEROBIC Blood Culture results may not be optimal due to an excessive volume of blood received in culture bottles   Culture   Final    NO GROWTH 3 DAYS Performed  at [facility], [address] [place], [place] [address]    Report Status PENDING  Incomplete  Blood Culture (routine x 2)     Status: None (Preliminary result)   Collection Time: 06/30/19  4:36 AM   Specimen: BLOOD  Result Value Ref Range Status   Specimen Description BLOOD LEFT FA  Final   Special Requests   Final    BOTTLES DRAWN AEROBIC AND ANAEROBIC Blood Culture results may not be optimal due to an inadequate volume of blood received in culture bottles   Culture   Final    NO GROWTH 3 DAYS Performed at [facility], [address] [place], [place] [address]    Report Status PENDING  Incomplete  Urine culture     Status: Abnormal   Collection Time: 06/30/19  5:14 AM   Specimen: In/Out Cath Urine  Result Value Ref Range Status   Specimen Description   Final    IN/OUT CATH URINE Performed at [facility], [address], [place], [place] [address]    Special Requests   Final    NONE Performed at [facility], [address], [place], [place] [address]    Culture (A)  Final    <10,000  COLONIES/mL INSIGNIFICANT GROWTH Performed at [facility], [place] [address] [place], [place] [address]    Report Status 07/01/2019 FINAL  Final  SARS CORONAVIRUS 2 (TAT 6-24 HRS) Nasopharyngeal Nasopharyngeal Swab     Status: None   Collection Time: 06/30/19  6:49 AM   Specimen: Nasopharyngeal Swab  Result Value Ref Range Status   SARS Coronavirus 2 NEGATIVE NEGATIVE Final    Comment: (NOTE) SARS-CoV-2 target nucleic acids are NOT DETECTED. The SARS-CoV-2 RNA is generally detectable in upper and lower respiratory specimens during the acute phase of infection. Negative results do not preclude SARS-CoV-2 infection, do not rule out co-infections with other pathogens, and should not be used as the sole basis for treatment or other patient management decisions. Negative results must be combined with clinical observations, patient history, and epidemiological information. The expected result is Negative. Fact Sheet for Patients: SugarRoll.be Fact Sheet for Healthcare Providers: https://www.woods-mathews.com/ This test is not yet approved or cleared by the [place] FDA and  has been authorized for detection and/or diagnosis of SARS-CoV-2 by FDA under an Emergency Use Authorization (EUA). This EUA will remain  in effect (meaning this test can be used) for the duration of the COVID-19 declaration under Section 56 4(b)(1) of the Act, 21 U.S.C. section 360bbb-3(b)(1), unless the authorization is terminated or revoked sooner. Performed at [facility], [place] [address] [place], [place] [address]   SARS Coronavirus 2 [facility] order, Performed in [facility] hospital lab) Nasopharyngeal Nasopharyngeal Swab     Status: None   Collection Time: 06/30/19 10:55 AM   Specimen: Nasopharyngeal Swab  Result Value Ref Range Status   SARS Coronavirus 2 NEGATIVE NEGATIVE Final    Comment: (NOTE) If result is NEGATIVE SARS-CoV-2 target nucleic  acids are NOT DETECTED. The SARS-CoV-2 RNA is generally detectable in upper and lower  respiratory specimens during the acute phase of infection. The lowest  concentration of SARS-CoV-2 viral copies this assay can detect is 250  copies / mL. A negative result does not preclude SARS-CoV-2 infection  and should not be used as the sole basis for treatment or other  patient management decisions.  A negative result may occur with  improper specimen collection / handling, submission  of specimen other  than nasopharyngeal swab, presence of viral mutation(s) within the  areas targeted by this assay, and inadequate number of viral copies  (<250 copies / mL). A negative result must be combined with clinical  observations, patient history, and epidemiological information. If result is POSITIVE SARS-CoV-2 target nucleic acids are DETECTED. The SARS-CoV-2 RNA is generally detectable in upper and lower  respiratory specimens dur ing the acute phase of infection.  Positive  results are indicative of active infection with SARS-CoV-2.  Clinical  correlation with patient history and other diagnostic information is  necessary to determine patient infection status.  Positive results do  not rule out bacterial infection or co-infection with other viruses. If result is PRESUMPTIVE POSTIVE SARS-CoV-2 nucleic acids MAY BE PRESENT.   A presumptive positive result was obtained on the submitted specimen  and confirmed on repeat testing.  While 2019 novel coronavirus  (SARS-CoV-2) nucleic acids may be present in the submitted sample  additional confirmatory testing may be necessary for epidemiological  and / or clinical management purposes  to differentiate between  SARS-CoV-2 and other Sarbecovirus currently known to infect humans.  If clinically indicated additional testing with an alternate test  methodology [phone]) is advised. The SARS-CoV-2 RNA is generally  detectable in upper and lower respiratory  sp ecimens during the acute  phase of infection. The expected result is Negative. Fact Sheet for Patients:  StrictlyIdeas.no Fact Sheet for Healthcare Providers: BankingDealers.co.za This test is not yet approved or cleared by the [place] FDA and has been authorized for detection and/or diagnosis of SARS-CoV-2 by FDA under an Emergency Use Authorization (EUA).  This EUA will remain in effect (meaning this test can be used) for the duration of the COVID-19 declaration under Section 564(b)(1) of the Act, 21 U.S.C. section 360bbb-3(b)(1), unless the authorization is terminated or revoked sooner. Performed at [facility], [address] [place], [place] [address]   Aerobic/Anaerobic Culture (surgical/deep wound)     Status: None (Preliminary result)   Collection Time: 06/30/19 12:41 PM   Specimen: Wound; Abscess  Result Value Ref Range Status   Specimen Description   Final    WOUND ABSCESS 1 Performed at [facility], [address] [place], [place] [address]    Special Requests   Final    NONE Performed at [facility], [address] [place], [place] [address]    Gram Stain   Final    RARE WBC PRESENT, PREDOMINANTLY PMN RARE GRAM POSITIVE COCCI    Culture   Final    NO GROWTH 3 DAYS NO ANAEROBES ISOLATED; CULTURE IN PROGRESS FOR 5 DAYS Performed at [facility] [address] [place], [place] [address]    Report Status PENDING  Incomplete  Aerobic/Anaerobic Culture (surgical/deep wound)     Status: None (Preliminary result)   Collection Time: 06/30/19 12:55 PM   Specimen: Wound; Abscess  Result Value Ref Range Status   Specimen Description   Final    WOUND ABSCESS 2 Performed at [facility], [address] [place], [place] [address]    Special Requests   Final    NONE Performed at [facility], [address] [place], [place] [address]    Gram Stain    Final    FEW WBC PRESENT, PREDOMINANTLY PMN ABUNDANT GRAM POSITIVE COCCI RARE GRAM NEGATIVE RODS    Culture   Final    NORMAL SKIN FLORA Performed at [facility], [address] [place], [place] [address]    Report Status PENDING  Incomplete    RADIOLOGY:  No results found.    Management plans discussed with the patient, family and they are in agreement.  CODE STATUS:     Code Status Orders  (From admission, onward)         Start     Ordered   06/30/19 0733  Full code  Continuous     06/30/19 0732        Code Status History    This patient has a current code status but no historical code status.   Advance Care Planning Activity      TOTAL TIME TAKING CARE OF THIS PATIENT: 40 minutes.    Henreitta Leber M.D on 07/03/2019 at 3:38 PM  Between 7am to 6pm - Pager - [phone]  After 6pm go to [email or web address] - Proofreader  [facility]  Office  [phone]  CC: Primary care physician; Tracie Harrier, MD

## 2019-07-03 NOTE — Progress Notes (Signed)
[place] Hospital Day(s): 3.   Post op day(s): 3 Days Post-Op.   Interval History: Patient seen and examined, no acute events or new complaints overnight. Patient reports he continues to improve. His pain is well controlled. No fever or chills. Cultures are still pending. He has been tolerating dressing changes. No other issues.   Vital signs in last 24 hours: [min-max] current  Temp:  [97.4 F (36.3 C)-99 F (37.2 C)] 97.8 F (36.6 C) (09/25 0738) Pulse Rate:  [54-65] 56 (09/25 0738) Resp:  [16-18] 18 (09/25 0738) BP: (105-125)/(66-82) 125/81 (09/25 0738) SpO2:  [96 %-98 %] 97 % (09/25 0738) Weight:  [108.4 kg] 108.4 kg (09/25 0419)     Height: 5' 10.98" (180.3 cm) Weight: 108.4 kg BMI (Calculated): 33.33   Intake/Output last 2 shifts:  09/24 0701 - 09/25 0700 In: 2798.5 [P.O.:960; IV Piggyback:1838.5] Out: -    Physical Exam:  Constitutional: alert, cooperative and no distress  Respiratory: breathing non-labored at rest  Integumentary: 3 debridement site to the inferior scrotum, left and right gluteal creases, no involvement in rectum. These areas are indurated, improved swelling and erythema, wound bases are pink without necrosis or purulent drainage.   Labs:  CBC Latest Ref Rng & Units 07/02/2019 07/01/2019 06/30/2019  WBC 4.0 - 10.5 K/uL 7.2 11.7(H) 13.5(H)  Hemoglobin 13.0 - 17.0 g/dL 11.8(L) 12.5(L) 14.7  Hematocrit 39.0 - 52.0 % 37.0(L) 38.8(L) 44.1  Platelets 150 - 400 K/uL 213 221 250   CMP Latest Ref Rng & Units 07/01/2019 06/30/2019 06/02/2017  Glucose 70 - 99 mg/dL 160(H) 143(H) 157(H)  BUN 6 - 20 mg/dL 7 13 8   Creatinine 0.61 - 1.24 mg/dL 0.45(L) 0.62 0.70  Sodium 135 - 145 mmol/L 137 135 138  Potassium 3.5 - 5.1 mmol/L 4.1 4.3 4.3  Chloride 98 - 111 mmol/L 107 104 108  CO2 22 - 32 mmol/L 26 24 23   Calcium 8.9 - 10.3 mg/dL 9.4 9.7 9.9  Total Protein 6.5 - 8.1 g/dL - 8.9(H) 7.9  Total Bilirubin 0.3 - 1.2 mg/dL - 0.8  0.5  Alkaline Phos 38 - 126 U/L - 84 83  AST 15 - 41 U/L - 35 61(H)  ALT 0 - 44 U/L - 42 112(H)    Imaging studies: No new pertinent imaging studies   Assessment/Plan:  43 y.o. male overall doing well 3 Days Post-Op s/p incision and debridementfor complex horseshoe perirectalabscessand abscess of scrotum, complicated by pertinent comorbidities includingDM.   - Continue diet  - Continue IV ABx while in house; follow up cultures; will need PO Abx for home   - pain control prn   - Continue dressing changes daily; HHRN ordered   - No indication for further surgical intervention or debridement at this time: will follow - medical management of comorbidities; SSI   - Discharge planning: okay for home this morning, PO Abx, HHRN ordered, follow up with surgery in 1 week. D/W primary MD   All of the above findings and recommendations were discussed with the patient, and the medical team, and all of patient's questions were answered to his expressed satisfaction.  -- Edison Simon, PA-C [facility] 07/03/2019, 9:20 AM [phone] M-F: 7am - 4pm

## 2019-07-03 NOTE — Progress Notes (Signed)
Curtis Peterson to be D/C'd home per MD order.  Discussed prescriptions and follow up appointments with the patient. Prescriptions given to patient, medication list explained in detail. Pt verbalized understanding.  Allergies as of 07/03/2019   No Known Allergies     Medication List    TAKE these medications   fluticasone 50 MCG/ACT nasal spray Commonly known as: Flonase Place 2 sprays into both nostrils daily.   lisinopril 2.5 MG tablet Commonly known as: ZESTRIL Take 2.5 mg by mouth daily.   metroNIDAZOLE 500 MG tablet Commonly known as: FLAGYL Take 1 tablet (500 mg total) by mouth 3 (three) times daily for 10 days.   sulfamethoxazole-trimethoprim 800-160 MG tablet Commonly known as: BACTRIM DS Take 1 tablet by mouth 2 (two) times daily for 10 days.   tiZANidine 2 MG tablet Commonly known as: ZANAFLEX Take 2 mg by mouth 3 (three) times daily as needed.   trimethoprim-polymyxin b ophthalmic solution Commonly known as: POLYTRIM Place 1 drop into the right eye every 4 (four) hours.       Vitals:   07/03/19 0509 07/03/19 0738  BP: 105/66 125/81  Pulse: (!) 54 (!) 56  Resp: 17 18  Temp: (!) 97.4 F (36.3 C) 97.8 F (36.6 C)  SpO2: 98% 97%    Skin clean, dry and intact without evidence of skin break down, no evidence of skin tears noted. IV catheter discontinued intact. Site without signs and symptoms of complications. Dressing and pressure applied. Pt denies pain at this time. No complaints noted.  An After Visit Summary was printed and given to the patient. Patient escorted via [place], and D/C home via private auto.  Chuck Hint RN [facility] [employer]

## 2019-07-05 LAB — CULTURE, BLOOD (ROUTINE X 2)
Culture: NO GROWTH
Culture: NO GROWTH

## 2019-07-05 LAB — AEROBIC/ANAEROBIC CULTURE W GRAM STAIN (SURGICAL/DEEP WOUND): Culture: NORMAL

## 2019-07-08 LAB — AEROBIC/ANAEROBIC CULTURE W GRAM STAIN (SURGICAL/DEEP WOUND)

## 2019-07-16 ENCOUNTER — Ambulatory Visit (INDEPENDENT_AMBULATORY_CARE_PROVIDER_SITE_OTHER): Payer: 59 | Admitting: Physician Assistant

## 2019-07-16 ENCOUNTER — Other Ambulatory Visit: Payer: Self-pay

## 2019-07-16 ENCOUNTER — Encounter: Payer: Self-pay | Admitting: Physician Assistant

## 2019-07-16 VITALS — BP 124/84 | HR 83 | Temp 97.7°F | Resp 14 | Ht 71.0 in | Wt 225.0 lb

## 2019-07-16 DIAGNOSIS — L03317 Cellulitis of buttock: Secondary | ICD-10-CM

## 2019-07-16 DIAGNOSIS — L0231 Cutaneous abscess of buttock: Secondary | ICD-10-CM

## 2019-07-16 DIAGNOSIS — Z09 Encounter for follow-up examination after completed treatment for conditions other than malignant neoplasm: Secondary | ICD-10-CM

## 2019-07-16 NOTE — Patient Instructions (Addendum)
Follow up with Curtis Peterson in two weeks on 07/29/19 at 9:45am. Please continue to change dressing daily and keep the area clean and dry. Follow up with our office Please call with any questions or concerns.

## 2019-07-16 NOTE — Progress Notes (Signed)
[facility] POST-OP OFFICE VISIT  07/16/2019  HPI: Javani Pfautz is a 43 y.o. male 16 days s/p incision and drainage of perirectal abscess with Dr Dahlia Byes.   He reports that he has done well post-op. No issues with pain, fever, chills. He has completed his course Bactrim and Flagyl. No longer packing wounds only covering with dry guaze. Little to no drainage reported. Overall doing well.   Vital signs: BP 124/84   Pulse 83   Temp 97.7 F (36.5 C) (Temporal)   Resp 14   Ht 5\' 11"  (1.803 m)   Wt 225 lb (102.1 kg)   SpO2 97%   BMI 31.38 kg/m    Physical Exam: Constitutional: Well appearing male, NAD Skin: Small incision to scrotum is well healed, no erythema. The incision to the left gluteal crease and right gluteal crease are well-healing, there is red granulation tissue in wound bed, small amount of fibrinous drainage, no erythema, non tender. No evidence of abscess of necrotizing infection.    Assessment/Plan: This is a 42 y.o. male 16 days s/p incision and drainage of perirectal abscess   - Pain control prn  - Okay to shower  - continue dry dressing changes  - Encouraged to keep area clean and dry.   - return precautions discussed  - rtc in 2 weeks for wound check  -- Edison Simon, PA-C [facility] 07/16/2019, 9:14 AM [phone] M-F: 7am - 4pm

## 2019-07-29 ENCOUNTER — Other Ambulatory Visit: Payer: Self-pay

## 2019-07-29 ENCOUNTER — Encounter: Payer: Self-pay | Admitting: Physician Assistant

## 2019-07-29 ENCOUNTER — Ambulatory Visit (INDEPENDENT_AMBULATORY_CARE_PROVIDER_SITE_OTHER): Payer: Self-pay | Admitting: Physician Assistant

## 2019-07-29 VITALS — BP 135/78 | HR 81 | Temp 97.7°F | Ht 71.0 in | Wt 219.0 lb

## 2019-07-29 DIAGNOSIS — L0231 Cutaneous abscess of buttock: Secondary | ICD-10-CM

## 2019-07-29 DIAGNOSIS — L03317 Cellulitis of buttock: Secondary | ICD-10-CM

## 2019-07-29 DIAGNOSIS — Z09 Encounter for follow-up examination after completed treatment for conditions other than malignant neoplasm: Secondary | ICD-10-CM

## 2019-07-29 NOTE — Patient Instructions (Signed)
   Follow-up with our office as needed.  Please call and ask to speak with a nurse if you develop questions or concerns.  

## 2019-07-29 NOTE — Progress Notes (Signed)
[place] [facility] POST-OP OFFICE VISIT  07/29/2019  HPI: Curtis Peterson is a 43 y.o. male 1 month s/p incision and drainage of perirectal abscess with Dr Dahlia Byes  Overall he continues to do well. No pain. No fever, chills. No longer doing dressing changes. No complaints.   Vital signs: BP 135/78   Pulse 81   Temp 97.7 F (36.5 C)   Ht 5\' 11"  (1.803 m)   Wt 219 lb (99.3 kg)   SpO2 98%   BMI 30.54 kg/m    Physical Exam: Constitutional: Well appearing male, NAD Skin: Small incision to scrotum is well healed, no erythema. The incision to the left gluteal crease and right gluteal crease are well-healing, these are almost completely close aside from the skin layer, no erythema, non tender. No evidence of abscess of necrotizing infection.   Assessment/Plan: This is a 43 y.o. male 1 month s/p incision and drainage of perirectal abscess   - Dressing prn   - pain control prn  - okay to shower  - follow up prn  -- Edison Simon, PA-C [facility] 07/29/2019, 9:57 AM [phone] M-F: 7am - 4pm

## 2019-08-12 ENCOUNTER — Telehealth: Payer: Self-pay | Admitting: Surgery

## 2019-08-12 NOTE — Telephone Encounter (Signed)
Patient is calling and said he has another boil coming up and its right on the tail bone, patient said he is having some pain at about a 6, patient said it hurts to sit, lay. Please call patient and advise.

## 2019-08-12 NOTE — Telephone Encounter (Signed)
Patient added to schedule 08/13/2019 for Zach.  Very pain, swollen and drainage.

## 2019-08-13 ENCOUNTER — Other Ambulatory Visit: Payer: Self-pay

## 2019-08-13 ENCOUNTER — Ambulatory Visit (INDEPENDENT_AMBULATORY_CARE_PROVIDER_SITE_OTHER): Payer: 59 | Admitting: Physician Assistant

## 2019-08-13 ENCOUNTER — Encounter: Payer: Self-pay | Admitting: Physician Assistant

## 2019-08-13 VITALS — BP 113/75 | HR 64 | Temp 97.5°F | Ht 71.0 in | Wt 226.4 lb

## 2019-08-13 DIAGNOSIS — L729 Follicular cyst of the skin and subcutaneous tissue, unspecified: Secondary | ICD-10-CM

## 2019-08-13 MED ORDER — SULFAMETHOXAZOLE-TRIMETHOPRIM 800-160 MG PO TABS: 1.0000 | ORAL_TABLET | Freq: Two times a day (BID) | ORAL | 0 refills | Status: DC

## 2019-08-13 MED ORDER — OXYCODONE HCL 5 MG PO TABS: 5.0000 mg | ORAL_TABLET | Freq: Four times a day (QID) | ORAL | 0 refills | Status: DC | PRN

## 2019-08-13 NOTE — Patient Instructions (Addendum)
Today we have drained your Abscess in the office. The numbing medication will wear off in approximately 4-8 hours. You will have some pain to the area afterwards but should not be as severe as prior to the procedure.  If you have been given antibiotics, please continue to take them after your procedure.  You may remove your dressing and packing tomorrow before your shower. Let the warm soapy water run over it. Rinse well and pat dry and may place a pad or gauze for any further drainage.   Follow up next week.   We will see you back as scheduled below.   If you have any questions or concerns prior to your appointment, please call our office and speak with a nurse.  Incision and Drainage Incision and drainage is a surgical procedure to open and drain a fluid-filled sac. The sac may be filled with pus, mucus, or blood. Examples of fluid-filled sacs that may need surgical drainage include cysts, skin infections (abscesses), and red lumps that develop from a ruptured cyst or a small abscess (boils). You may need this procedure if the affected area is large, painful, infected, or not healing well. Tell a health care provider about:  Any allergies you have.  All medicines you are taking, including vitamins, herbs, eye drops, creams, and over-the-counter medicines.  Any problems you or family members have had with anesthetic medicines.  Any blood disorders you have.  Any surgeries you have had.  Any medical conditions you have.  Whether you are pregnant or may be pregnant. What are the risks? Generally, this is a safe procedure. However, problems may occur, including:  Infection.  Bleeding.  Allergic reactions to medicines.  Scarring.  What happens before the procedure?  You may need an ultrasound or other imaging tests to see how large or deep the fluid-filled sac is.  You may have blood tests to check for infection.  You may get a tetanus shot.  You may be given antibiotic  medicine to help prevent infection.  Follow instructions from your health care provider about eating or drinking restrictions.  Ask your health care provider about: ? Changing or stopping your regular medicines. This is especially important if you are taking diabetes medicines or blood thinners. ? Taking medicines such as aspirin and ibuprofen. These medicines can thin your blood. Do not take these medicines before your procedure if your health care provider instructs you not to.  Plan to have someone take you home after the procedure.  If you will be going home right after the procedure, plan to have someone stay with you for 24 hours. What happens during the procedure?  To reduce your risk of infection: ? Your health care team will wash or sanitize their hands. ? Your skin will be washed with soap.  You will be given one or more of the following: ? A medicine to help you relax (sedative). ? A medicine to numb the area (local anesthetic). ? A medicine to make you fall asleep (general anesthetic).  An incision will be made in the top of the fluid-filled sac.  The contents of the sac may be squeezed out, or a syringe or tube (catheter)may be used to empty the sac.  The catheter may be left in place for several weeks to drain any fluid. Or, your health care provider may stitch open the edges of the incision to make a long-term opening for drainage (marsupialization).  The inside of the sac may be washed out (  irrigated) with a sterile solution and packed with gauze before it is covered with a bandage (dressing). The procedure may vary among health care providers and hospitals. What happens after the procedure?  Your blood pressure, heart rate, breathing rate, and blood oxygen level will be monitored often until the medicines you were given have worn off.  Do not drive for 24 hours if you received a sedative. This information is not intended to replace advice given to you by your  health care provider. Make sure you discuss any questions you have with your health care provider. Document Released: 03/20/2001 Document Revised: 03/01/2016 Document Reviewed: 07/15/2015 Elsevier Interactive Patient Education  2017 [employer].   Incision and Drainage, Care After Refer to this sheet in the next few weeks. These instructions provide you with information about caring for yourself after your procedure. Your health care provider may also give you more specific instructions. Your treatment has been planned according to current medical practices, but problems sometimes occur. Call your health care provider if you have any problems or questions after your procedure. What can I expect after the procedure? After the procedure, it is common to have:  Pain or discomfort around your incision site.  Drainage from your incision.  Follow these instructions at home:  Take over-the-counter and prescription medicines only as told by your health care provider.  If you were prescribed an antibiotic medicine, take it as told by your health care provider.Do not stop taking the antibiotic even if you start to feel better.  Followinstructions from your health care provider about: ? How to take care of your incision. ? When and how you should change your packing and bandage (dressing). Wash your hands with soap and water before you change your dressing. If soap and water are not available, use hand sanitizer. ? When you should remove your dressing.  Do not take baths, swim, or use a hot tub until your health care provider approves.  Keep all follow-up visits as told by your health care provider. This is important.  Check your incision area every day for signs of infection. Check for: ? More redness, swelling, or pain. ? More fluid or blood. ? Warmth. ? Pus or a bad smell. Contact a health care provider if:  Your cyst or abscess returns.  You have a fever.  You have more redness,  swelling, or pain around your incision.  You have more fluid or blood coming from your incision.  Your incision feels warm to the touch.  You have pus or a bad smell coming from your incision. Get help right away if:  You have severe pain or bleeding.  You cannot eat or drink without vomiting.  You have decreased urine output.  You become short of breath.  You have chest pain.  You cough up blood.  The area where the incision and drainage occurred becomes numb or it tingles. This information is not intended to replace advice given to you by your health care provider. Make sure you discuss any questions you have with your health care provider. Document Released: 12/17/2011 Document Revised: 02/24/2016 Document Reviewed: 07/15/2015 Elsevier Interactive Patient Education  2017 [employer].

## 2019-08-13 NOTE — Progress Notes (Signed)
[facility] SURGICAL CLINIC NOTE  08/13/2019  History of Present Illness: Curtis Peterson is a 43 y.o. male known to our service with a history of perirectal abscess who underwent I&D on 09/22 with Dr Dahlia Byes. He has done very well since then and was cleared in follow up.  Today, he reports that he has had an are near his tailbone which has been worsening over the last 2 weeks. Over the last 3 days the area has become painful. No fever or chills. No drainage. He is concerned given his recent history. No other complaints.    Past Medical History: Past Medical History:  Diagnosis Date  . Diabetes mellitus without complication ([place])   . Hypertension      Past Surgical History: Past Surgical History:  Procedure Laterality Date  . head lesions removed  2015  . INCISION AND DRAINAGE PERIRECTAL ABSCESS N/A 06/30/2019   Procedure: IRRIGATION AND DEBRIDEMENT PERIRECTAL ABSCESS;  Surgeon: Jules Husbands, MD;  Location: [facility] ORS;  Service: General;  Laterality: N/A;    Home Medications: Prior to Admission medications   Medication Sig Start Date End Date Taking? Authorizing Provider  fluticasone (FLONASE) 50 MCG/ACT nasal spray Place 2 sprays into both nostrils daily. 07/03/19 07/02/20  Henreitta Leber, MD  lisinopril (ZESTRIL) 2.5 MG tablet Take 2.5 mg by mouth daily.     [provider]  tiZANidine (ZANAFLEX) 2 MG tablet Take 2 mg by mouth 3 (three) times daily as needed. 06/26/19   [provider]    Allergies: No Known Allergies  Review of Systems: Review of Systems  Constitutional: Negative for chills and fever.  Gastrointestinal: Negative for abdominal pain, nausea and vomiting.  Skin:       + Abscess (tailbone)  All other systems reviewed and are negative.   Physical Exam BP 113/75   Pulse 64   Temp (!) 97.5 F (36.4 C) (Temporal)   Ht 5\' 11"  (1.803 m)   Wt 226 lb 6.4 oz (102.7 kg)   SpO2 97%   BMI 31.58 kg/m  CONSTITUTIONAL: Well  appearing male, NAD HEENT:  Normocephalic, atraumatic, extraocular motion intact. RESPIRATORY:  Normal respiratory effort without pathologic use of accessory muscles. SKIN: There is an area of swelling on the superior portion of the right gluteal crease over the tailbone. This is indurated with a small area of fluctuance on the inferior portion. No active drainage NEUROLOGIC:  Motor and sensation is grossly normal.  Cranial nerves are grossly intact. PSYCH:  Alert and oriented to person, place and time. Affect is normal.  Labs/Imaging: No pertinent imaging studies  Assessment and Plan: This is a 43 y.o. male with simple abscess to the tailbone.    - I&D preformed in office; refer to procedure section for description of this  - Will prescribe Bactrim x10 days; follow up cultures  - Dressing changes/pakcing discussed  - short course of pain medications  - follow up in 5 days for re-check  Face-to-face time spent with the patient and care providers was 15 minutes, with more than 50% of the time spent counseling, educating, and coordinating care of the patient.      PROCEDURE:   Pre-op diagnosis: Simple Abscess (Tailbone)  Post-op diagnosis: same  Procedure: Incision and Drainage  Anesthesia: 5 ccs of 1% lidocaine with epinephrine  Description of Procedure:  Patient was identified correctly and consent obtained. The area was infiltrated with 5 ccs of 1% lidocaine with epinephrine. The area was then prepped and draped  in typical sterile fashion. Using a 11 blade scalpel a 4 cm incision was made over the area of fluctuance. Gross purulence was expressed. This was cultures. Small kely forceps were use to break up any loculations. The wound was then irrigated and packed with iodoform gauze and covered with 4x4 and tape. All sharps were accounted for a disposed of.   Complications: None apparent.      -- Edison Simon, PA-C [facility] 08/13/2019, 12:06  PM [phone] M-F: 7am - 4pm

## 2019-08-18 ENCOUNTER — Ambulatory Visit (INDEPENDENT_AMBULATORY_CARE_PROVIDER_SITE_OTHER): Payer: 59 | Admitting: Physician Assistant

## 2019-08-18 ENCOUNTER — Encounter: Payer: Self-pay | Admitting: Physician Assistant

## 2019-08-18 ENCOUNTER — Other Ambulatory Visit: Payer: Self-pay

## 2019-08-18 VITALS — BP 120/76 | HR 67 | Temp 97.9°F | Resp 15 | Ht 71.0 in | Wt 223.0 lb

## 2019-08-18 DIAGNOSIS — L0231 Cutaneous abscess of buttock: Secondary | ICD-10-CM

## 2019-08-18 DIAGNOSIS — L03317 Cellulitis of buttock: Secondary | ICD-10-CM

## 2019-08-18 DIAGNOSIS — Z09 Encounter for follow-up examination after completed treatment for conditions other than malignant neoplasm: Secondary | ICD-10-CM

## 2019-08-18 LAB — ANAEROBIC AND AEROBIC CULTURE

## 2019-08-18 NOTE — Progress Notes (Signed)
[facility] POST-OP OFFICE VISIT  08/18/2019  HPI: Maggie Krippner is a 43 y.o. male 5 days s/p I&D of right gluteal crease abscess.   Today, she reports that he is doing well. No longer with drainage or pain. No Fever or chills. No growth on cultures. No issues with Bactrim.   Vital signs: Wt 223 lb (101.2 kg)   BMI 31.10 kg/m    Physical Exam: Constitutional: Well appearing male, NAD Skin: Previous I&D site to the right gluteal crease is well healed, no erythema, no drainage, indurated  Assessment/Plan: This is a 43 y.o. male 5 days s/p I&D of left gluteal crease abscess   - Complete ABx  - Dry dressing prn  - follow up cultures  - discussed signs and symptoms for return  - may benefit from dermatology evaluation if these become persistent  - rtc prn  -- Edison Simon, PA-C [facility] 08/18/2019, 10:56 AM [phone] M-F: 7am - 4pm

## 2019-08-18 NOTE — Patient Instructions (Signed)
Follow-up with our office as needed.  Please call and ask to speak with a nurse if you develop questions or concerns.  Complete your antibiotics

## 2019-10-19 ENCOUNTER — Encounter: Payer: Self-pay | Admitting: Emergency Medicine

## 2019-10-19 ENCOUNTER — Other Ambulatory Visit: Payer: Self-pay

## 2019-10-19 ENCOUNTER — Encounter: Payer: Self-pay | Admitting: Surgery

## 2019-10-19 ENCOUNTER — Ambulatory Visit (INDEPENDENT_AMBULATORY_CARE_PROVIDER_SITE_OTHER): Payer: 59 | Admitting: Surgery

## 2019-10-19 VITALS — BP 133/87 | HR 85 | Temp 97.9°F | Resp 12 | Ht 71.0 in | Wt 225.0 lb

## 2019-10-19 DIAGNOSIS — L732 Hidradenitis suppurativa: Secondary | ICD-10-CM

## 2019-10-19 DIAGNOSIS — L0291 Cutaneous abscess, unspecified: Secondary | ICD-10-CM | POA: Diagnosis not present

## 2019-10-19 MED ORDER — SULFAMETHOXAZOLE-TRIMETHOPRIM 800-160 MG PO TABS: 1.0000 | ORAL_TABLET | Freq: Two times a day (BID) | ORAL | 0 refills | Status: DC

## 2019-10-19 NOTE — Patient Instructions (Addendum)
Pick up your medication at the pharmacy and begin taking it today.   Urology appointment Curtis Peterson 10/20/2019 @ 11 am @ [facility].  Their office is located in the [facility] building beside [facility].  We have sent the referral to [facility] and someone from their office will call to schedule an appointment. If you do not hear from their office within 7 days please call [phone] to inquire.     Hidradenitis Suppurativa Hidradenitis suppurativa is a long-term (chronic) skin disease. It is similar to a severe form of acne, but it affects areas of the body where acne would be unusual, especially areas of the body where skin rubs against skin and becomes moist. These include:  Underarms.  Groin.  Genital area.  Buttocks.  Upper thighs.  Breasts. Hidradenitis suppurativa may start out as small lumps or pimples caused by blocked sweat glands or hair follicles. Pimples may develop into deep sores that break open (rupture) and drain pus. Over time, affected areas of skin may thicken and become scarred. This condition is rare and does not spread from person to person (non-contagious). What are the causes? The exact cause of this condition is not known. It may be related to:  Male and male hormones.  An overactive disease-fighting system (immune system). The immune system may over-react to blocked hair follicles or sweat glands and cause swelling and pus-filled sores. What increases the risk? You are more likely to develop this condition if you:  Are male.  Are 30-68 years old.  Have a family history of hidradenitis suppurativa.  Have a personal history of acne.  Are overweight.  Smoke.  Take the medicine lithium. What are the signs or symptoms? The first symptoms are usually painful bumps in the skin, similar to pimples. The condition may get worse over time (progress), or it may only cause mild symptoms. If the disease progresses,  symptoms may include:  Skin bumps getting bigger and growing deeper into the skin.  Bumps rupturing and draining pus.  Itchy, infected skin.  Skin getting thicker and scarred.  Tunnels under the skin (fistulas) where pus drains from a bump.  Pain during daily activities, such as pain during walking if your groin area is affected.  Emotional problems, such as stress or depression. This condition may affect your appearance and your ability or willingness to wear certain clothes or do certain activities. How is this diagnosed? This condition is diagnosed by a health care provider who specializes in skin diseases (dermatologist). You may be diagnosed based on:  Your symptoms and medical history.  A physical exam.  Testing a pus sample for infection.  Blood tests. How is this treated? Your treatment will depend on how severe your symptoms are. The same treatment will not work for everybody with this condition. You may need to try several treatments to find what works best for you. Treatment may include:  Cleaning and bandaging (dressing) your wounds as needed.  Lifestyle changes, such as new skin care routines.  Taking medicines, such as: ? Antibiotics. ? Acne medicines. ? Medicines to reduce the activity of the immune system. ? A diabetes medicine (metformin). ? Birth control pills, for women. ? Steroids to reduce swelling and pain.  Working with a mental health care provider, if you experience emotional distress due to this condition. If you have severe symptoms that do not get better with medicine, you may need surgery. Surgery may involve:  Using a laser to clear the skin  and remove hair follicles.  Opening and draining deep sores.  Removing the areas of skin that are diseased and scarred. Follow these instructions at home: Medicines   Take over-the-counter and prescription medicines only as told by your health care provider.  If you were prescribed an antibiotic  medicine, take it as told by your health care provider. Do not stop taking the antibiotic even if your condition improves. Skin care  If you have open wounds, cover them with a clean dressing as told by your health care provider. Keep wounds clean by washing them gently with soap and water when you bathe.  Do not shave the areas where you get hidradenitis suppurativa.  Do not wear deodorant.  Wear loose-fitting clothes.  Try to avoid getting overheated or sweaty. If you get sweaty or wet, change into clean, dry clothes as soon as you can.  To help relieve pain and itchiness, cover sore areas with a warm, clean washcloth (warm compress) for 5-10 minutes as often as needed.  If told by your health care provider, take a bleach bath twice a week: ? Fill your bathtub halfway with water. ? Pour in  cup of unscented household bleach. ? Soak in the tub for 5-10 minutes. ? Only soak from the neck down. Avoid water on your face and hair. ? Shower to rinse off the bleach from your skin. General instructions  Learn as much as you can about your disease so that you have an active role in your treatment. Work closely with your health care provider to find treatments that work for you.  If you are overweight, work with your health care provider to lose weight as recommended.  Do not use any products that contain nicotine or tobacco, such as cigarettes and e-cigarettes. If you need help quitting, ask your health care provider.  If you struggle with living with this condition, talk with your health care provider or work with a mental health care provider as recommended.  Keep all follow-up visits as told by your health care provider. This is important. Where to find more information  Hidradenitis Harvey.: https://[email or web address]-foundation.org/ Contact a health care provider if you have:  A flare-up of hidradenitis suppurativa.  A fever or chills.  Trouble controlling your  symptoms at home.  Trouble doing your daily activities because of your symptoms.  Trouble dealing with emotional problems related to your condition. Summary  Hidradenitis suppurativa is a long-term (chronic) skin disease. It is similar to a severe form of acne, but it affects areas of the body where acne would be unusual.  The first symptoms are usually painful bumps in the skin, similar to pimples. The condition may get worse over time (progress), or it may only cause mild symptoms.  If you have open wounds, cover them with a clean dressing as told by your health care provider. Keep wounds clean by washing them gently with soap and water when you bathe.  Besides skin care, treatment may include medicines, laser treatment, and surgery. This information is not intended to replace advice given to you by your health care provider. Make sure you discuss any questions you have with your health care provider. Document Revised: 10/02/2017 Document Reviewed: 10/02/2017 Elsevier Patient Education  2020 [employer].

## 2019-10-19 NOTE — Progress Notes (Signed)
10/19/2019  History of Present Illness: Curtis Peterson is a 44 y.o. male presenting today for evaluation of a possible new groin abscess.  He has prior history of I&D of gluteal abscess, perineal abscess, and gluteal crease abscess by Dr. Adora Fridge and Mr. Olean Ree.  However when asking the patient what has been going on brings him to the office today, he reports that the foreskin of the penis is inflamed and he is not able to retract it.  He denies any new abscesses in the groin areas.  He reports that this has been going on for about 2 weeks he called today because he could not withstand the pain any longer.  He is able to void.  Denies any fevers or chills.  Past Medical History: Past Medical History:  Diagnosis Date  . Diabetes mellitus without complication ([place])   . Hypertension      Past Surgical History: Past Surgical History:  Procedure Laterality Date  . head lesions removed  2015  . INCISION AND DRAINAGE PERIRECTAL ABSCESS N/A 06/30/2019   Procedure: IRRIGATION AND DEBRIDEMENT PERIRECTAL ABSCESS;  Surgeon: Jules Husbands, MD;  Location: [facility] ORS;  Service: General;  Laterality: N/A;    Home Medications: Prior to Admission medications   Medication Sig Start Date End Date Taking? Authorizing Provider  fluticasone (FLONASE) 50 MCG/ACT nasal spray Place 2 sprays into both nostrils daily. 07/03/19 07/02/20 Yes Sainani, Belia Heman, MD  lisinopril (ZESTRIL) 2.5 MG tablet Take 2.5 mg by mouth daily.    Yes [provider]  sulfamethoxazole-trimethoprim (BACTRIM DS) 800-160 MG tablet Take 1 tablet by mouth 2 (two) times daily. 10/19/19   Olean Ree, MD    Allergies: No Known Allergies  Review of Systems: Review of Systems  Constitutional: Negative for chills and fever.  Respiratory: Negative for shortness of breath.   Cardiovascular: Negative for chest pain.  Gastrointestinal: Negative for nausea and vomiting.  Genitourinary:       Swelling and pain at the foreskin of the  penis.    Physical Exam BP 133/87   Pulse 85   Temp 97.9 F (36.6 C) (Temporal)   Resp 12   Ht 5\' 11"  (1.803 m)   Wt 225 lb (102.1 kg)   SpO2 96%   BMI 31.38 kg/m  CONSTITUTIONAL: No acute distress HEENT:  Normocephalic, atraumatic, extraocular motion intact. RESPIRATORY:  Lungs are clear, and breath sounds are equal bilaterally. Normal respiratory effort without pathologic use of accessory muscles. CARDIOVASCULAR: Heart is regular without murmurs, gallops, or rubs. GI/GU: The abdomen is soft, nondistended, nontender.  The patient has over the inferior portion of the abdomen, multiple small boils consistent with areas of hidradenitis.  There are some areas that are actively draining but they are not inflamed or tender to palpation.  These areas extend towards the pubic tubercle.  On the penis itself, the foreskin show some inflammation with some areas of small tears from infection.  It is tender to try to retract his foreskin.  No drainage noticeable from the penis, but I am unable to do a full exam.  NEUROLOGIC:  Motor and sensation is grossly normal.  Cranial nerves are grossly intact. PSYCH:  Alert and oriented to person, place and time. Affect is normal.  Labs/Imaging: None recently  Assessment and Plan: This is a 44 y.o. male with hidradenitis as well as infection of the foreskin.  -With regards to the foreskin, we are able to get in touch with [facility] urology he will be seen tomorrow  in the office.  In the meantime, I will start him on Bactrim DS twice daily but will defer to urology for any further antibiotic prescriptions or changes. -With regards to his hidradenitis, although there are couple of areas that are draining at this point, he is not tender and not having any induration or swelling in those areas and there is no I&D is needed at this point.  The Bactrim may actually help to decrease some of the drainage.  Patient reports that he has been seeing a dermatologist in the  past but he was not happy with their care because nothing was ever done.  We will do a new referral to a different dermatology office for second opinion for management of his hidradenitis.  At this point there is no need for any I&D from the surgery standpoint. -Patient to follow-up with [place] as needed.  Face-to-face time spent with the patient and care providers was 25 minutes, with more than 50% of the time spent counseling, educating, and coordinating care of the patient.     Melvyn Neth, [facility]

## 2019-10-19 NOTE — Progress Notes (Signed)
10/20/2019 7:13 PM   Curtis Peterson 26-Aug-1976 [id number]  Referring provider: Tracie Harrier, MD [address] [facility] [place],  [place] [address]  Chief Complaint  Patient presents with  . Follow-up    HPI: Curtis Peterson is a 44 year old male with a history of hidradenitis and several I & D of perineal abscess is referred by Dr. Hampton Abbot for a swollen foreskin.  He was seen by Dr. Hampton Abbot yesterday for symptoms of two weeks of swelling of his foreskin limiting the ability to retract the foreskin past the glans.  He was started on Bactrim DS and referred to [place].    He stated he started to have penile tenderness that started 2 weeks ago.  He states that he has increasingly been unable to pull back the foreskin to aid in urination and cleaning the head of his penis.  He has noted some blood on his underwear, but he denies any purulent discharge.  He is sexually active, but he states he is a monogamous relationship with his girlfriend.  Patient denies any gross hematuria, dysuria or suprapubic/flank pain.  Patient denies any fevers, chills, nausea or vomiting.  He is a diet-controlled diabetic.  His UA is orange cloudy, trace blood, 2+ protein, 1+ leukocytes and 6-10 WBCs, 0-2 RBCs, 0-10 epithelial cells, and few bacteria.  His PVR is 0 mL.    PMH: Past Medical History:  Diagnosis Date  . Diabetes mellitus without complication ([place])   . Hypertension     Surgical History: Past Surgical History:  Procedure Laterality Date  . head lesions removed  2015  . INCISION AND DRAINAGE PERIRECTAL ABSCESS N/A 06/30/2019   Procedure: IRRIGATION AND DEBRIDEMENT PERIRECTAL ABSCESS;  Surgeon: Jules Husbands, MD;  Location: [facility] ORS;  Service: General;  Laterality: N/A;    Home Medications:  Allergies as of 10/20/2019   No Known Allergies     Medication List       Accurate as of October 20, 2019 11:59 PM. If you have any questions, ask your nurse or doctor.          fluconazole 100 MG tablet Commonly known as: DIFLUCAN Take 1 tablet (100 mg total) by mouth daily. X 7 days Started by: Alrick Cubbage, PA-C   fluticasone 50 MCG/ACT nasal spray Commonly known as: Flonase Place 2 sprays into both nostrils daily.   lisinopril 2.5 MG tablet Commonly known as: ZESTRIL Take 2.5 mg by mouth daily.   nystatin-triamcinolone ointment Commonly known as: MYCOLOG Apply 1 application topically 2 (two) times daily. Started by: Zara Council, PA-C   sulfamethoxazole-trimethoprim 800-160 MG tablet Commonly known as: BACTRIM DS Take 1 tablet by mouth 2 (two) times daily.       Allergies: No Known Allergies  Family History: Family History  Problem Relation Age of Onset  . Hypertension Other   . Diabetes Mother   . Diabetes Father     Social History:  reports that he has been smoking cigarettes. He has been smoking about 0.50 packs per day. He has never used smokeless tobacco. He reports that he does not drink alcohol or use drugs.  ROS: UROLOGY Frequent Urination?: No Hard to postpone urination?: No Burning/pain with urination?: No Get up at night to urinate?: No Leakage of urine?: No Urine stream starts and stops?: No Trouble starting stream?: No Do you have to strain to urinate?: No Blood in urine?: No Urinary tract infection?: No Sexually transmitted disease?: No Injury to kidneys or  bladder?: No Painful intercourse?: No Weak stream?: No Erection problems?: No Penile pain?: No  Gastrointestinal Nausea?: No Vomiting?: No Indigestion/heartburn?: No Diarrhea?: No Constipation?: No  Constitutional Fever: No Night sweats?: No Weight loss?: No Fatigue?: No  Skin Skin rash/lesions?: No Itching?: No  Eyes Blurred vision?: No Double vision?: No  Ears/Nose/Throat Sore throat?: No Sinus problems?: No  Hematologic/Lymphatic Swollen glands?: No Easy bruising?: No  Cardiovascular Leg swelling?: No Chest pain?:  No  Respiratory Cough?: No Shortness of breath?: No  Endocrine Excessive thirst?: No  Musculoskeletal Back pain?: No Joint pain?: No  Neurological Headaches?: No Dizziness?: No  Psychologic Depression?: No Anxiety?: No  Physical Exam: BP 135/83   Pulse 72   Ht 5\' 11"  (1.803 m)   Wt 221 lb 8 oz (100.5 kg)   BMI 30.89 kg/m   Constitutional:  Well nourished. Alert and oriented, No acute distress. HEENT: [place] AT, mask in place.  Trachea midline, no masses. Cardiovascular: No clubbing, cyanosis, or edema. Respiratory: Normal respiratory effort, no increased work of breathing. GU: No CVA tenderness.  No bladder fullness or masses.  Patient with uncircumcised phallus.  Foreskin with splits in the skin likely due to attempts to draw back the foreskin and small ulcers 34mm or less in size dispersed throughout the foreskin interiorly.  Foreskin not able to be retracted completely, 1 cm aperture.  Foreskin is tender.  Urethral meatus is patent.  No penile discharge. No penile lesions or rashes.   The part of the glans I could examine appears normal.   Neurologic: Grossly intact, no focal deficits, moving all 4 extremities. Psychiatric: Normal mood and affect.  Laboratory Data: Lab Results  Component Value Date   WBC 7.2 07/02/2019   HGB 11.8 (L) 07/02/2019   HCT 37.0 (L) 07/02/2019   MCV 101.9 (H) 07/02/2019   PLT 213 07/02/2019    Lab Results  Component Value Date   CREATININE 0.45 (L) 07/01/2019    No results found for: PSA  No results found for: TESTOSTERONE  Lab Results  Component Value Date   HGBA1C 6.0 (H) 06/30/2019    Lab Results  Component Value Date   TSH 2.052 06/30/2019    No results found for: CHOL, HDL, CHOLHDL, VLDL, LDLCALC  Lab Results  Component Value Date   AST 35 06/30/2019   Lab Results  Component Value Date   ALT 42 06/30/2019   No components found for: ALKALINEPHOPHATASE No components found for: BILIRUBINTOTAL  No results found  for: ESTRADIOL  Urinalysis Component     Latest Ref Rng & Units 10/20/2019  Specific Gravity, UA     1.005 - 1.030 >1.030 (H)  pH, UA     5.0 - 7.5 6.0  Color, UA     Yellow Orange  Appearance Ur     Clear Cloudy (A)  Leukocytes,UA     Negative 1+ (A)  Protein,UA     Negative/Trace 2+ (A)  Glucose, UA     Negative Negative  Ketones, UA     Negative Negative  RBC, UA     Negative Trace (A)  Bilirubin, UA     Negative Negative  Urobilinogen, Ur     0.2 - 1.0 mg/dL 1.0  Nitrite, UA     Negative Negative  Microscopic Examination      See below:    I have reviewed the labs.   Pertinent Imaging: Results for YASSIEL, ARNDORFER (MRN [place]:[id number]) as of 10/20/2019 12:53  Ref. Range 10/20/2019 11:19  Scan Result Unknown 0    Assessment & Plan:    1. Balanoposthitis Explained to the patient the physical exam findings and that it is likely due to candidiasis, but herpes may present in the same fashion and we will draw herpes titers today I have started him on Diflucan 100 mg 1 daily for 7 days and prescribed Mycolog cream to apply to the foreskin twice daily I will see him again in 2 weeks and he is to call for any worsening of symptoms  Return in about 2 weeks (around 11/03/2019) for recheck .  These notes generated with voice recognition software. I apologize for typographical errors.  Zara Council, PA-C  [facility] [address] [place] [address]  [place], [place] [address] [phone]

## 2019-10-20 ENCOUNTER — Other Ambulatory Visit: Payer: Self-pay

## 2019-10-20 ENCOUNTER — Encounter: Payer: Self-pay | Admitting: Urology

## 2019-10-20 ENCOUNTER — Telehealth: Payer: Self-pay | Admitting: Emergency Medicine

## 2019-10-20 ENCOUNTER — Ambulatory Visit: Payer: 59 | Admitting: Urology

## 2019-10-20 VITALS — BP 135/83 | HR 72 | Ht 71.0 in | Wt 221.5 lb

## 2019-10-20 DIAGNOSIS — N5089 Other specified disorders of the male genital organs: Secondary | ICD-10-CM | POA: Diagnosis not present

## 2019-10-20 DIAGNOSIS — N476 Balanoposthitis: Secondary | ICD-10-CM

## 2019-10-20 LAB — BLADDER SCAN AMB NON-IMAGING: Scan Result: 0

## 2019-10-20 MED ORDER — FLUCONAZOLE 100 MG PO TABS: 100.0000 mg | ORAL_TABLET | Freq: Every day | ORAL | 0 refills | Status: DC

## 2019-10-20 MED ORDER — NYSTATIN-TRIAMCINOLONE 100000-0.1 UNIT/GM-% EX OINT: 1.0000 "application " | TOPICAL_OINTMENT | Freq: Two times a day (BID) | CUTANEOUS | 0 refills | Status: DC

## 2019-10-20 NOTE — Telephone Encounter (Signed)
[facility] Attached demographic sheet and last encounter. Received confirmation sheet that fax went through.

## 2019-10-21 ENCOUNTER — Telehealth: Payer: Self-pay | Admitting: Emergency Medicine

## 2019-10-21 ENCOUNTER — Other Ambulatory Visit: Payer: Self-pay | Admitting: Family Medicine

## 2019-10-21 ENCOUNTER — Telehealth: Payer: Self-pay | Admitting: Family Medicine

## 2019-10-21 LAB — URINALYSIS, COMPLETE
Bilirubin, UA: NEGATIVE
Glucose, UA: NEGATIVE
Ketones, UA: NEGATIVE
Nitrite, UA: NEGATIVE
Specific Gravity, UA: >1.03 — ABNORMAL HIGH (ref 1.005–1.030)
Urobilinogen, Ur: 1 mg/dL (ref 0.2–1.0)
pH, UA: 6 (ref 5.0–7.5)

## 2019-10-21 LAB — MICROSCOPIC EXAMINATION

## 2019-10-21 LAB — HSV(HERPES SIMPLEX VRS) I + II AB-IGG
HSV 1 Glycoprotein G Ab, IgG: 2.76 index — ABNORMAL HIGH (ref 0.00–0.90)
HSV 2 IgG, Type Spec: 20.8 index — ABNORMAL HIGH (ref 0.00–0.90)

## 2019-10-21 MED ORDER — VALACYCLOVIR HCL 1 G PO TABS: 1000.0000 mg | ORAL_TABLET | Freq: Two times a day (BID) | ORAL | 0 refills | Status: DC

## 2019-10-21 NOTE — Telephone Encounter (Signed)
-----   Message from Nori Riis, PA-C sent at 10/21/2019  1:18 PM EST ----- Please let [name] know that his herpes test returned positive.  We need to get him started on Valtrex 1000 mg BID x 10 days.  He should inform his sexual partners that he has tested positive.  He can stop the Diflucan tablets and the cream at this time.

## 2019-10-21 NOTE — Telephone Encounter (Signed)
[place] to verify that they received the fax I sent over yesterday. Spoke to [place], she states they did received fax and patient was seen today 10/21/19. States they will send encounter note.

## 2019-10-21 NOTE — Telephone Encounter (Signed)
Patient notified and voiced understanding.

## 2019-11-03 ENCOUNTER — Ambulatory Visit (INDEPENDENT_AMBULATORY_CARE_PROVIDER_SITE_OTHER): Payer: 59 | Admitting: Urology

## 2019-11-03 ENCOUNTER — Other Ambulatory Visit: Payer: Self-pay

## 2019-11-03 ENCOUNTER — Encounter: Payer: Self-pay | Admitting: Urology

## 2019-11-03 VITALS — BP 140/91 | HR 76 | Ht 71.0 in | Wt 216.0 lb

## 2019-11-03 DIAGNOSIS — A6002 Herpesviral infection of other male genital organs: Secondary | ICD-10-CM | POA: Diagnosis not present

## 2019-11-03 MED ORDER — VALACYCLOVIR HCL 500 MG PO TABS: 500.0000 mg | ORAL_TABLET | Freq: Every day | ORAL | 3 refills | Status: DC

## 2019-11-03 NOTE — Progress Notes (Signed)
11/03/2019 2:21 PM   Curtis Peterson [birth year] [id number]  Referring provider: Tracie Harrier, MD [address] [facility] [place],  [place] [address]  Chief Complaint  Patient presents with  . Follow-up    HPI: [name] is a 44 year old male with a history of hidradenitis and several I & D of perineal abscess is referred by Dr. Hampton Abbot for a swollen foreskin.  He was seen by Dr. Hampton Abbot yesterday for symptoms of two weeks of swelling of his foreskin limiting the ability to retract the foreskin past the glans.  He was started on Bactrim DS and referred to [place].    He stated he started to have penile tenderness that started 2 weeks ago.  He states that he has increasingly been unable to pull back the foreskin to aid in urination and cleaning the head of his penis.  He has noted some blood on his underwear, but he denies any purulent discharge.  He is sexually active, but he states he is a monogamous relationship with his girlfriend.  Patient denies any gross hematuria, dysuria or suprapubic/flank pain.  Patient denies any fevers, chills, nausea or vomiting.  He is a diet-controlled diabetic.  His UA is orange cloudy, trace blood, 2+ protein, 1+ leukocytes and 6-10 WBCs, 0-2 RBCs, 0-10 epithelial cells, and few bacteria.  His PVR is 0 mL.    PMH: Past Medical History:  Diagnosis Date  . Diabetes mellitus without complication ([place])   . Hypertension     Surgical History: Past Surgical History:  Procedure Laterality Date  . head lesions removed  2015  . INCISION AND DRAINAGE PERIRECTAL ABSCESS N/A 06/30/2019   Procedure: IRRIGATION AND DEBRIDEMENT PERIRECTAL ABSCESS;  Surgeon: Jules Husbands, MD;  Location: [facility] ORS;  Service: General;  Laterality: N/A;    Home Medications:  Allergies as of 11/03/2019   No Known Allergies     Medication List       Accurate as of November 03, 2019  2:21 PM. If you have any questions, ask your nurse or doctor.        STOP  taking these medications   doxycycline 100 MG tablet Commonly known as: VIBRA-TABS Stopped by: Joven Mom, PA-C   sulfamethoxazole-trimethoprim 800-160 MG tablet Commonly known as: BACTRIM DS Stopped by: Devon Pretty, PA-C     TAKE these medications   fluconazole 100 MG tablet Commonly known as: DIFLUCAN Take 1 tablet (100 mg total) by mouth daily. X 7 days   fluticasone 50 MCG/ACT nasal spray Commonly known as: Flonase Place 2 sprays into both nostrils daily.   lisinopril 2.5 MG tablet Commonly known as: ZESTRIL Take 2.5 mg by mouth daily.   nystatin-triamcinolone ointment Commonly known as: MYCOLOG Apply 1 application topically 2 (two) times daily.   valACYclovir 500 MG tablet Commonly known as: Valtrex Take 1 tablet (500 mg total) by mouth daily. What changed:   medication strength  how much to take  when to take this Changed by: Zay Yeargan, PA-C       Allergies: No Known Allergies  Family History: Family History  Problem Relation Age of Onset  . Hypertension Other   . Diabetes Mother   . Diabetes Father     Social History:  reports that he has been smoking cigarettes. He has been smoking about 0.50 packs per day. He has never used smokeless tobacco. He reports that he does not drink alcohol or use drugs.  ROS: UROLOGY Frequent Urination?: No  Hard to postpone urination?: No Burning/pain with urination?: No Get up at night to urinate?: No Leakage of urine?: No Urine stream starts and stops?: No Trouble starting stream?: No Do you have to strain to urinate?: No Blood in urine?: No Urinary tract infection?: No Sexually transmitted disease?: No Injury to kidneys or bladder?: No Painful intercourse?: No Weak stream?: No Erection problems?: No Penile pain?: No  Gastrointestinal Nausea?: No Vomiting?: No Indigestion/heartburn?: No Diarrhea?: No Constipation?: No  Constitutional Fever: No Night sweats?: No Weight loss?:  No Fatigue?: No  Skin Skin rash/lesions?: No Itching?: No  Eyes Blurred vision?: No Double vision?: No  Ears/Nose/Throat Sore throat?: No Sinus problems?: No  Hematologic/Lymphatic Swollen glands?: No Easy bruising?: No  Cardiovascular Leg swelling?: No Chest pain?: No  Respiratory Cough?: No Shortness of breath?: No  Endocrine Excessive thirst?: No  Musculoskeletal Back pain?: No Joint pain?: No  Neurological Headaches?: No Dizziness?: No  Psychologic Depression?: No Anxiety?: No  Physical Exam: BP (!) 140/91   Pulse 76   Ht 5\' 11"  (1.803 m)   Wt 216 lb (98 kg)   BMI 30.13 kg/m   Constitutional:  Well nourished. Alert and oriented, No acute distress. HEENT: [place] AT, mask in place.  Trachea midline, no masses. Cardiovascular: No clubbing, cyanosis, or edema. Respiratory: Normal respiratory effort, no increased work of breathing. GI: Abdomen is soft, non tender, non distended, no abdominal masses. Liver and spleen not palpable.  No hernias appreciated.  Stool sample for occult testing is not indicated.   GU: No CVA tenderness.  No bladder fullness or masses.  Patient with uncircumcised phallus.  Urethral meatus is patent.  No penile discharge. No penile lesions or rashes.  Neurologic: Grossly intact, no focal deficits, moving all 4 extremities. Psychiatric: Normal mood and affect.  Laboratory Data: Results for orders placed or performed in visit on 10/20/19  Microscopic Examination   URINE  Result Value Ref Range   WBC, UA 6-10 (A) 0 - 5 /hpf   RBC 0-2 0 - 2 /hpf   Epithelial Cells (non renal) 0-10 0 - 10 /hpf   Bacteria, UA Few None seen/Few  Urinalysis, Complete  Result Value Ref Range   Specific Gravity, UA >1.030 (H) 1.005 - 1.030   pH, UA 6.0 5.0 - 7.5   Color, UA Orange Yellow   Appearance Ur Cloudy (A) Clear   Leukocytes,UA 1+ (A) Negative   Protein,UA 2+ (A) Negative/Trace   Glucose, UA Negative Negative   Ketones, UA Negative Negative    RBC, UA Trace (A) Negative   Bilirubin, UA Negative Negative   Urobilinogen, Ur 1.0 0.2 - 1.0 mg/dL   Nitrite, UA Negative Negative   Microscopic Examination See below:   HSV(herpes simplex vrs) 1+2 ab-IgG  Result Value Ref Range   HSV 1 Glycoprotein G Ab, IgG 2.76 (H) 0.00 - 0.90 index   HSV 2 IgG, Type Spec 20.80 (H) 0.00 - 0.90 index  Bladder Scan (Post Void Residual) in office  Result Value Ref Range   Scan Result 0    Lab Results  Component Value Date   WBC 7.2 07/02/2019   HGB 11.8 (L) 07/02/2019   HCT 37.0 (L) 07/02/2019   MCV 101.9 (H) 07/02/2019   PLT 213 07/02/2019    Lab Results  Component Value Date   CREATININE 0.45 (L) 07/01/2019    No results found for: PSA  No results found for: TESTOSTERONE  Lab Results  Component Value Date   HGBA1C 6.0 (  H) 06/30/2019    Lab Results  Component Value Date   TSH 2.052 06/30/2019    No results found for: CHOL, HDL, CHOLHDL, VLDL, LDLCALC  Lab Results  Component Value Date   AST 35 06/30/2019   Lab Results  Component Value Date   ALT 42 06/30/2019   No components found for: ALKALINEPHOPHATASE No components found for: BILIRUBINTOTAL  No results found for: ESTRADIOL  I have reviewed the labs.   Pertinent Imaging: Results for YADIEL, RUSHIN (MRN [place]:[id number]) as of 10/20/2019 12:53  Ref. Range 10/20/2019 11:19  Scan Result Unknown 0    Assessment & Plan:    1. Balanoposthitis Secondary to herpes Will continue Valtrex 500 mg daily Counseled regarding the fact it is a STI and can be transmitted through any type of sexual activity  RTC in 6 months for recheck  Return in about 6 months (around 05/02/2020) for recheck .  These notes generated with voice recognition software. I apologize for typographical errors.  Zara Council, PA-C  [facility] [address]  [place], [place] [address] [phone]

## 2019-12-19 NOTE — Progress Notes (Deleted)
iPledge #: [id number]

## 2019-12-21 ENCOUNTER — Other Ambulatory Visit: Payer: Self-pay

## 2019-12-21 DIAGNOSIS — L732 Hidradenitis suppurativa: Secondary | ICD-10-CM

## 2019-12-21 DIAGNOSIS — Z79899 Other long term (current) drug therapy: Secondary | ICD-10-CM

## 2019-12-21 MED ORDER — ISOTRETINOIN 20 MG PO CAPS: ORAL_CAPSULE | ORAL | 0 refills | Status: DC

## 2020-01-04 ENCOUNTER — Ambulatory Visit: Payer: 59 | Admitting: Dermatology

## 2020-01-21 ENCOUNTER — Ambulatory Visit: Payer: 59 | Admitting: Dermatology

## 2020-01-21 ENCOUNTER — Other Ambulatory Visit: Payer: Self-pay

## 2020-01-27 ENCOUNTER — Ambulatory Visit: Payer: 59 | Admitting: Dermatology

## 2020-01-27 ENCOUNTER — Other Ambulatory Visit: Payer: Self-pay

## 2020-01-27 DIAGNOSIS — L0292 Furuncle, unspecified: Secondary | ICD-10-CM

## 2020-01-27 DIAGNOSIS — L739 Follicular disorder, unspecified: Secondary | ICD-10-CM

## 2020-01-27 DIAGNOSIS — L02811 Cutaneous abscess of head [any part, except face]: Secondary | ICD-10-CM

## 2020-01-27 DIAGNOSIS — L02214 Cutaneous abscess of groin: Secondary | ICD-10-CM

## 2020-01-27 DIAGNOSIS — L700 Acne vulgaris: Secondary | ICD-10-CM

## 2020-01-27 DIAGNOSIS — K130 Diseases of lips: Secondary | ICD-10-CM

## 2020-01-27 DIAGNOSIS — L732 Hidradenitis suppurativa: Secondary | ICD-10-CM

## 2020-01-27 DIAGNOSIS — L853 Xerosis cutis: Secondary | ICD-10-CM

## 2020-01-27 DIAGNOSIS — L0291 Cutaneous abscess, unspecified: Secondary | ICD-10-CM

## 2020-01-27 DIAGNOSIS — Z79899 Other long term (current) drug therapy: Secondary | ICD-10-CM | POA: Diagnosis not present

## 2020-01-27 MED ORDER — ISOTRETINOIN 30 MG PO CAPS: ORAL_CAPSULE | ORAL | 0 refills | Status: DC

## 2020-01-27 NOTE — Patient Instructions (Addendum)
Dry Skin Care  What causes dry skin?  Dry skin is common and results from inadequate moisture in the outer skin layers. Dry skin usually results from the excessive loss of moisture from the skin surface. This occurs due to two major factors: 1. Normally the skin's oil glands deposit a layer of oil on the skin's surface. This layer of oil prevents the loss of moisture from the skin. Exposure to soaps, cleaners, solvents, and disinfectants removes this oily film, allowing water to escape. 2. Water loss from the skin increases when the humidity is low. During winter months we spend a lot of time indoors where the air is heated. Heated air has very low humidity. This also contributes to dry skin.  A tendency for dry skin may accompany such disorders as eczema. Also, as people age, the number of functioning oil glands decreases, and the tendency toward dry skin can be a sensation of skin tightness when emerging from the shower.  How do I manage dry skin?  1. Humidify your environment. This can be accomplished by using a humidifier in your bedroom at night during winter months. 2. Bathing can actually put moisture back into your skin if done right. Take the following steps while bathing to sooth dry skin:  Avoid hot water, which only dries the skin and makes itching worse. Use warm water.  Avoid washcloths or extensive rubbing or scrubbing.  Use mild soaps like unscented Dove, Oil of Olay, Cetaphil, Basis, or CeraVe.  If you take baths rather than showers, rinse off soap residue with clean water before getting out of tub.  Once out of the shower/tub, pat dry gently with a soft towel. Leave your skin damp.  While still damp, apply any medicated ointment/cream you were prescribed to the affected areas. After you apply your medicated ointment/cream, then apply your moisturizer to your whole body.This is the most important step in dry skin care. If this is omitted, your skin will continue to be  dry.  The choice of moisturizer is also very important. In general, lotion will not provider enough moisture to severely dry skin because it is water based. You should use an ointment or cream. Moisturizers should also be unscented. Good choices include Vaseline (plain petrolatum), Aquaphor, Cetaphil, CeraVe, Vanicream, DML Forte, Aveeno moisture, or Eucerin Cream.  Bath oils can be helpful, but do not replace the application of moisturizer after the bath. In addition, they make the tub slippery causing an increased risk for falls. Therefore, we do not recommend their use.  While taking isotretinoin, do not share pills and do not donate blood. Isotretinoin is best absorbed when taken with a fatty meal. Isotretinoin can make you sensitive to the sun. Daily careful sun protection including sunscreen SPF 30+ when outdoors is recommended.

## 2020-01-27 NOTE — Progress Notes (Signed)
   Follow-Up Visit   Subjective  Curtis Peterson is a 44 y.o. male who presents for the following: Acne (Isotretinoin week 4 - patient c/o chelitis but no other symptoms of Isotretinoin. He has continued to have painful abscesses in the groin and scalp).  The following portions of the chart were reviewed this encounter and updated as appropriate: Tobacco  Allergies  Meds  Problems  Med Hx  Surg Hx  Fam Hx     Review of Systems: No other skin or systemic complaints.  Objective  Well appearing patient in no apparent distress; mood and affect are within normal limits.  A focused examination was performed including face, scalp, and groin. Relevant physical exam findings are noted in the Assessment and Plan.  Objective  Pubic, Scalp: Actively draining abscesses  Objective  Scalp: 2 actively draining abscesses of the scalp and abdomen  Objective  Face, trunk, extremities: Dry skin  Assessment & Plan  Abscess (2) Pubic; Scalp - new today These are actively draining and do not need I&D.  This is all typical part of his Hidradenitis.  Hopefully Isotretinoin will work with more time. Recommend CLN sport wash daily   Cheilitis Lips  Secondary to Isotretinoin -  Recommend lip moisturizer daily   Folliculitis and Hidradenitis and Acne and Furunculosis Scalp; axillae; groin  With furunculosis, hidradenitis suppurativa, and acne- Isotretinoin week 4 Increase Isotretinoin to 30mg  po QD. #30 0RF. Consider Cephalexin in the future if abscesses continue to emerge. While taking isotretinoin, do not share pills and do not donate blood. Isotretinoin is best absorbed when taken with a fatty meal. Isotretinoin can make you sensitive to the sun. Daily careful sun protection including sunscreen SPF 30+ when outdoors is recommended.   ISOtretinoin (ACCUTANE) 30 MG capsule - Scalp  Xerosis cutis Face, trunk, extremities  Secondary to Isotretinoin - Recommend mild soaps and moisturizers  daily.   Isotretinoin F/U - 01/27/20 1700      Isotretinoin Follow Up   Date  01/27/20    Acne breakouts since last visit?  Yes      Dosage   Current (To Date) Dosage (mg)  --   patient currently using 20 mg po QD     Side Effects   Skin  Dry Lips;Dry Skin    Gastrointestinal  WNL    Neurological  WNL    Constitutional  WNL      Return in about 1 month (around 02/26/2020).   Luther Redo, CMA, am acting as scribe for Sarina Ser, MD .  Documentation: I have reviewed the above documentation for accuracy and completeness, and I agree with the above.  Sarina Ser, MD

## 2020-01-28 ENCOUNTER — Encounter: Payer: Self-pay | Admitting: Dermatology

## 2020-01-28 ENCOUNTER — Ambulatory Visit: Payer: 59 | Admitting: Dermatology

## 2020-02-03 ENCOUNTER — Other Ambulatory Visit: Payer: Self-pay

## 2020-02-03 ENCOUNTER — Ambulatory Visit (INDEPENDENT_AMBULATORY_CARE_PROVIDER_SITE_OTHER): Payer: 59 | Admitting: Surgery

## 2020-02-03 ENCOUNTER — Encounter: Payer: Self-pay | Admitting: Surgery

## 2020-02-03 VITALS — BP 143/85 | HR 82 | Temp 97.2°F | Ht 71.0 in | Wt 228.2 lb

## 2020-02-03 DIAGNOSIS — L732 Hidradenitis suppurativa: Secondary | ICD-10-CM

## 2020-02-03 MED ORDER — SULFAMETHOXAZOLE-TRIMETHOPRIM 800-160 MG PO TABS: 1.0000 | ORAL_TABLET | Freq: Two times a day (BID) | ORAL | 0 refills | Status: AC

## 2020-02-03 NOTE — Patient Instructions (Signed)
Hidradenitis Suppurativa Hidradenitis suppurativa is a long-term (chronic) skin disease. It is similar to a severe form of acne, but it affects areas of the body where acne would be unusual, especially areas of the body where skin rubs against skin and becomes moist. These include:  Underarms.  Groin.  Genital area.  Buttocks.  Upper thighs.  Breasts. Hidradenitis suppurativa may start out as small lumps or pimples caused by blocked sweat glands or hair follicles. Pimples may develop into deep sores that break open (rupture) and drain pus. Over time, affected areas of skin may thicken and become scarred. This condition is rare and does not spread from person to person (non-contagious). What are the causes? The exact cause of this condition is not known. It may be related to:  Male and male hormones.  An overactive disease-fighting system (immune system). The immune system may over-react to blocked hair follicles or sweat glands and cause swelling and pus-filled sores. What increases the risk? You are more likely to develop this condition if you:  Are male.  Are 11-55 years old.  Have a family history of hidradenitis suppurativa.  Have a personal history of acne.  Are overweight.  Smoke.  Take the medicine lithium. What are the signs or symptoms? The first symptoms are usually painful bumps in the skin, similar to pimples. The condition may get worse over time (progress), or it may only cause mild symptoms. If the disease progresses, symptoms may include:  Skin bumps getting bigger and growing deeper into the skin.  Bumps rupturing and draining pus.  Itchy, infected skin.  Skin getting thicker and scarred.  Tunnels under the skin (fistulas) where pus drains from a bump.  Pain during daily activities, such as pain during walking if your groin area is affected.  Emotional problems, such as stress or depression. This condition may affect your appearance and your  ability or willingness to wear certain clothes or do certain activities. How is this diagnosed? This condition is diagnosed by a health care provider who specializes in skin diseases (dermatologist). You may be diagnosed based on:  Your symptoms and medical history.  A physical exam.  Testing a pus sample for infection.  Blood tests. How is this treated? Your treatment will depend on how severe your symptoms are. The same treatment will not work for everybody with this condition. You may need to try several treatments to find what works best for you. Treatment may include:  Cleaning and bandaging (dressing) your wounds as needed.  Lifestyle changes, such as new skin care routines.  Taking medicines, such as: ? Antibiotics. ? Acne medicines. ? Medicines to reduce the activity of the immune system. ? A diabetes medicine (metformin). ? Birth control pills, for women. ? Steroids to reduce swelling and pain.  Working with a mental health care provider, if you experience emotional distress due to this condition. If you have severe symptoms that do not get better with medicine, you may need surgery. Surgery may involve:  Using a laser to clear the skin and remove hair follicles.  Opening and draining deep sores.  Removing the areas of skin that are diseased and scarred. Follow these instructions at home: Medicines   Take over-the-counter and prescription medicines only as told by your health care provider.  If you were prescribed an antibiotic medicine, take it as told by your health care provider. Do not stop taking the antibiotic even if your condition improves. Skin care  If you have open wounds, cover   them with a clean dressing as told by your health care provider. Keep wounds clean by washing them gently with soap and water when you bathe.  Do not shave the areas where you get hidradenitis suppurativa.  Do not wear deodorant.  Wear loose-fitting clothes.  Try to avoid  getting overheated or sweaty. If you get sweaty or wet, change into clean, dry clothes as soon as you can.  To help relieve pain and itchiness, cover sore areas with a warm, clean washcloth (warm compress) for 5-10 minutes as often as needed.  If told by your health care provider, take a bleach bath twice a week: ? Fill your bathtub halfway with water. ? Pour in  cup of unscented household bleach. ? Soak in the tub for 5-10 minutes. ? Only soak from the neck down. Avoid water on your face and hair. ? Shower to rinse off the bleach from your skin. General instructions  Learn as much as you can about your disease so that you have an active role in your treatment. Work closely with your health care provider to find treatments that work for you.  If you are overweight, work with your health care provider to lose weight as recommended.  Do not use any products that contain nicotine or tobacco, such as cigarettes and e-cigarettes. If you need help quitting, ask your health care provider.  If you struggle with living with this condition, talk with your health care provider or work with a mental health care provider as recommended.  Keep all follow-up visits as told by your health care provider. This is important. Where to find more information  Hidradenitis Suppurativa [employer].: https://www.hs-foundation.org/ Contact a health care provider if you have:  A flare-up of hidradenitis suppurativa.  A fever or chills.  Trouble controlling your symptoms at home.  Trouble doing your daily activities because of your symptoms.  Trouble dealing with emotional problems related to your condition. Summary  Hidradenitis suppurativa is a long-term (chronic) skin disease. It is similar to a severe form of acne, but it affects areas of the body where acne would be unusual.  The first symptoms are usually painful bumps in the skin, similar to pimples. The condition may get worse over time  (progress), or it may only cause mild symptoms.  If you have open wounds, cover them with a clean dressing as told by your health care provider. Keep wounds clean by washing them gently with soap and water when you bathe.  Besides skin care, treatment may include medicines, laser treatment, and surgery. This information is not intended to replace advice given to you by your health care provider. Make sure you discuss any questions you have with your health care provider. Document Revised: 10/02/2017 Document Reviewed: 10/02/2017 Elsevier Patient Education  2020 [employer] Inc.  

## 2020-02-05 NOTE — Progress Notes (Signed)
Outpatient Surgical Follow Up  02/05/2020  Curtis Peterson is an 44 y.o. male.   Chief Complaint  Patient presents with  . Follow-up    f/u new abscess coming back buttock    HPI: Curtis Peterson is a 44 year old [place] male with a longstanding history of hidradenitis.  Resents now with a perineal pain that is intermittent sharp and moderate intensity.  There is some drainage.  No fevers no chills no shortness of breath.  No blood per rectum.  Past Medical History:  Diagnosis Date  . Diabetes mellitus without complication ([place])   . Hypertension     Past Surgical History:  Procedure Laterality Date  . head lesions removed  2015  . INCISION AND DRAINAGE PERIRECTAL ABSCESS N/A 06/30/2019   Procedure: IRRIGATION AND DEBRIDEMENT PERIRECTAL ABSCESS;  Surgeon: Jules Husbands, MD;  Location: [facility] ORS;  Service: General;  Laterality: N/A;    Family History  Problem Relation Age of Onset  . Hypertension Other   . Diabetes Mother   . Diabetes Father     Social History:  reports that he has been smoking cigarettes. He has been smoking about 0.50 packs per day. He has never used smokeless tobacco. He reports that he does not drink alcohol or use drugs.  Allergies: No Known Allergies  Medications reviewed.    ROS Full ROS performed and is otherwise negative other than what is stated in HPI   BP (!) 143/85   Pulse 82   Temp (!) 97.2 F (36.2 C) (Temporal)   Ht 5\' 11"  (1.803 m)   Wt 228 lb 3.2 oz (103.5 kg)   SpO2 97%   BMI 31.83 kg/m   Physical Exam Vitals and nursing note reviewed. Exam conducted with a chaperone present.  Constitutional:      General: He is not in acute distress.    Appearance: Normal appearance. He is normal weight.  Abdominal:     General: Abdomen is flat. There is no distension.     Palpations: Abdomen is soft. There is no mass.     Tenderness: There is no abdominal tenderness. There is no guarding or rebound.     Hernia: No hernia is present.   Comments: There is evidence of some suprapubic hidradenitis but no abscess.  Genitourinary:    Comments: Hidradenitis involving the perineum and significant induration around the right lateral aspect of the anus.  No evidence of definitive abscess. Musculoskeletal:        General: No swelling. Normal range of motion.     Cervical back: Normal range of motion and neck supple. No rigidity or tenderness.  Skin:    General: Skin is warm and dry.     Capillary Refill: Capillary refill takes less than 2 seconds.  Neurological:     General: No focal deficit present.     Mental Status: He is alert and oriented to person, place, and time.  Psychiatric:        Mood and Affect: Mood normal.        Behavior: Behavior normal.        Thought Content: Thought content normal.        Judgment: Judgment normal.         Assessment/Plan: 44 year old male with longstanding history of hidradenitis supra pubic area and currently active hidradenitis in the perineal area.  Currently there is induration however there is no evidence of a clear-cut abscess.  Discussed with patient detail and we will try antibiotic therapy first.  He may need formal excision and debridement but not at this time.  Prescription given for Bactrim and I will see him in a week or so.  No need for emergent surgical intervention at this time   Greater than 50% of the 25 minutes  visit was spent in counseling/coordination of care   Caroleen Hamman, MD [place] Surgeon

## 2020-02-08 ENCOUNTER — Encounter: Payer: Self-pay | Admitting: Surgery

## 2020-02-08 ENCOUNTER — Other Ambulatory Visit: Payer: Self-pay

## 2020-02-08 ENCOUNTER — Ambulatory Visit (INDEPENDENT_AMBULATORY_CARE_PROVIDER_SITE_OTHER): Payer: 59 | Admitting: Surgery

## 2020-02-08 VITALS — BP 131/87 | HR 73 | Temp 98.6°F | Resp 12 | Ht 71.0 in | Wt 228.0 lb

## 2020-02-08 DIAGNOSIS — L732 Hidradenitis suppurativa: Secondary | ICD-10-CM

## 2020-02-08 NOTE — Patient Instructions (Addendum)
Please complete your course of antibiotic.   We will see you back for a follow up, please see your appointment below.  Call the office if you have any questions or concerns.   Hidradenitis Suppurativa Hidradenitis suppurativa is a long-term (chronic) skin disease. It is similar to a severe form of acne, but it affects areas of the body where acne would be unusual, especially areas of the body where skin rubs against skin and becomes moist. These include:  Underarms.  Groin.  Genital area.  Buttocks.  Upper thighs.  Breasts. Hidradenitis suppurativa may start out as small lumps or pimples caused by blocked sweat glands or hair follicles. Pimples may develop into deep sores that break open (rupture) and drain pus. Over time, affected areas of skin may thicken and become scarred. This condition is rare and does not spread from person to person (non-contagious). What are the causes? The exact cause of this condition is not known. It may be related to:  Male and male hormones.  An overactive disease-fighting system (immune system). The immune system may over-react to blocked hair follicles or sweat glands and cause swelling and pus-filled sores. What increases the risk? You are more likely to develop this condition if you:  Are male.  Are 32-88 years old.  Have a family history of hidradenitis suppurativa.  Have a personal history of acne.  Are overweight.  Smoke.  Take the medicine lithium. What are the signs or symptoms? The first symptoms are usually painful bumps in the skin, similar to pimples. The condition may get worse over time (progress), or it may only cause mild symptoms. If the disease progresses, symptoms may include:  Skin bumps getting bigger and growing deeper into the skin.  Bumps rupturing and draining pus.  Itchy, infected skin.  Skin getting thicker and scarred.  Tunnels under the skin (fistulas) where pus drains from a bump.  Pain during  daily activities, such as pain during walking if your groin area is affected.  Emotional problems, such as stress or depression. This condition may affect your appearance and your ability or willingness to wear certain clothes or do certain activities. How is this diagnosed? This condition is diagnosed by a health care provider who specializes in skin diseases (dermatologist). You may be diagnosed based on:  Your symptoms and medical history.  A physical exam.  Testing a pus sample for infection.  Blood tests. How is this treated? Your treatment will depend on how severe your symptoms are. The same treatment will not work for everybody with this condition. You may need to try several treatments to find what works best for you. Treatment may include:  Cleaning and bandaging (dressing) your wounds as needed.  Lifestyle changes, such as new skin care routines.  Taking medicines, such as: ? Antibiotics. ? Acne medicines. ? Medicines to reduce the activity of the immune system. ? A diabetes medicine (metformin). ? Birth control pills, for women. ? Steroids to reduce swelling and pain.  Working with a mental health care provider, if you experience emotional distress due to this condition. If you have severe symptoms that do not get better with medicine, you may need surgery. Surgery may involve:  Using a laser to clear the skin and remove hair follicles.  Opening and draining deep sores.  Removing the areas of skin that are diseased and scarred. Follow these instructions at home: Medicines   Take over-the-counter and prescription medicines only as told by your health care provider.  If  you were prescribed an antibiotic medicine, take it as told by your health care provider. Do not stop taking the antibiotic even if your condition improves. Skin care  If you have open wounds, cover them with a clean dressing as told by your health care provider. Keep wounds clean by washing them  gently with soap and water when you bathe.  Do not shave the areas where you get hidradenitis suppurativa.  Do not wear deodorant.  Wear loose-fitting clothes.  Try to avoid getting overheated or sweaty. If you get sweaty or wet, change into clean, dry clothes as soon as you can.  To help relieve pain and itchiness, cover sore areas with a warm, clean washcloth (warm compress) for 5-10 minutes as often as needed.  If told by your health care provider, take a bleach bath twice a week: ? Fill your bathtub halfway with water. ? Pour in  cup of unscented household bleach. ? Soak in the tub for 5-10 minutes. ? Only soak from the neck down. Avoid water on your face and hair. ? Shower to rinse off the bleach from your skin. General instructions  Learn as much as you can about your disease so that you have an active role in your treatment. Work closely with your health care provider to find treatments that work for you.  If you are overweight, work with your health care provider to lose weight as recommended.  Do not use any products that contain nicotine or tobacco, such as cigarettes and e-cigarettes. If you need help quitting, ask your health care provider.  If you struggle with living with this condition, talk with your health care provider or work with a mental health care provider as recommended.  Keep all follow-up visits as told by your health care provider. This is important. Where to find more information  Hidradenitis [place].: https://www.hs-foundation.org/ Contact a health care provider if you have:  A flare-up of hidradenitis suppurativa.  A fever or chills.  Trouble controlling your symptoms at home.  Trouble doing your daily activities because of your symptoms.  Trouble dealing with emotional problems related to your condition. Summary  Hidradenitis suppurativa is a long-term (chronic) skin disease. It is similar to a severe form of acne, but  it affects areas of the body where acne would be unusual.  The first symptoms are usually painful bumps in the skin, similar to pimples. The condition may get worse over time (progress), or it may only cause mild symptoms.  If you have open wounds, cover them with a clean dressing as told by your health care provider. Keep wounds clean by washing them gently with soap and water when you bathe.  Besides skin care, treatment may include medicines, laser treatment, and surgery. This information is not intended to replace advice given to you by your health care provider. Make sure you discuss any questions you have with your health care provider. Document Revised: 10/02/2017 Document Reviewed: 10/02/2017 Elsevier Patient Education  2020 [employer].

## 2020-02-10 NOTE — Progress Notes (Signed)
Outpatient Surgical Follow Up  02/10/2020  Curtis Peterson is an 44 y.o. male.   Chief Complaint  Patient presents with  . Follow-up    Hidradenitis     HPI: Doing better but some drainage. No fevers or chills  Past Medical History:  Diagnosis Date  . Diabetes mellitus without complication ([place])   . Hypertension     Past Surgical History:  Procedure Laterality Date  . head lesions removed  2015  . INCISION AND DRAINAGE PERIRECTAL ABSCESS N/A 06/30/2019   Procedure: IRRIGATION AND DEBRIDEMENT PERIRECTAL ABSCESS;  Surgeon: Jules Husbands, MD;  Location: [facility] ORS;  Service: General;  Laterality: N/A;    Family History  Problem Relation Age of Onset  . Hypertension Other   . Diabetes Mother   . Diabetes Father     Social History:  reports that he has been smoking cigarettes. He has been smoking about 0.50 packs per day. He has never used smokeless tobacco. He reports that he does not drink alcohol or use drugs.  Allergies: No Known Allergies  Medications reviewed.    ROS Full ROS performed and is otherwise negative other than what is stated in HPI   BP 131/87   Pulse 73   Temp 98.6 F (37 C)   Resp 12   Ht 5\' 11"  (1.803 m)   Wt 228 lb (103.4 kg)   SpO2 97%   BMI 31.80 kg/m   Physical Exam NAD , alert Abd: soft, nt Skin: hidradenitis perineum w some induration but no abscess, improved as compared to a few days ago. No necrotizing infection    Assessment/Plan:  1. Hidradenitis improving after antibiotics, no abscess.  No surgical intervention RTC 2 weeks   Greater than 50% of the 15 minutes  visit was spent in counseling/coordination of care   Caroleen Hamman, MD [place] Surgeon

## 2020-02-24 ENCOUNTER — Other Ambulatory Visit: Payer: Self-pay

## 2020-02-24 ENCOUNTER — Encounter: Payer: Self-pay | Admitting: Surgery

## 2020-02-24 ENCOUNTER — Encounter: Payer: Self-pay | Admitting: Emergency Medicine

## 2020-02-24 ENCOUNTER — Ambulatory Visit (INDEPENDENT_AMBULATORY_CARE_PROVIDER_SITE_OTHER): Payer: 59 | Admitting: Surgery

## 2020-02-24 VITALS — BP 130/88 | HR 73 | Temp 97.7°F | Resp 12 | Ht 71.0 in | Wt 230.4 lb

## 2020-02-24 DIAGNOSIS — L732 Hidradenitis suppurativa: Secondary | ICD-10-CM

## 2020-02-24 MED ORDER — SULFAMETHOXAZOLE-TRIMETHOPRIM 400-80 MG PO TABS: 1.0000 | ORAL_TABLET | Freq: Two times a day (BID) | ORAL | 0 refills | Status: AC

## 2020-02-24 NOTE — Patient Instructions (Signed)
Pick up your medication at your local pharmacy.   Our surgery scheduler will contact you to schedule your surgery. Please have the blue sheet available when she calls you.   Hidradenitis Suppurativa Hidradenitis suppurativa is a long-term (chronic) skin disease. It is similar to a severe form of acne, but it affects areas of the body where acne would be unusual, especially areas of the body where skin rubs against skin and becomes moist. These include:  Underarms.  Groin.  Genital area.  Buttocks.  Upper thighs.  Breasts. Hidradenitis suppurativa may start out as small lumps or pimples caused by blocked sweat glands or hair follicles. Pimples may develop into deep sores that break open (rupture) and drain pus. Over time, affected areas of skin may thicken and become scarred. This condition is rare and does not spread from person to person (non-contagious). What are the causes? The exact cause of this condition is not known. It may be related to:  Male and male hormones.  An overactive disease-fighting system (immune system). The immune system may over-react to blocked hair follicles or sweat glands and cause swelling and pus-filled sores. What increases the risk? You are more likely to develop this condition if you:  Are male.  Are 9-46 years old.  Have a family history of hidradenitis suppurativa.  Have a personal history of acne.  Are overweight.  Smoke.  Take the medicine lithium. What are the signs or symptoms? The first symptoms are usually painful bumps in the skin, similar to pimples. The condition may get worse over time (progress), or it may only cause mild symptoms. If the disease progresses, symptoms may include:  Skin bumps getting bigger and growing deeper into the skin.  Bumps rupturing and draining pus.  Itchy, infected skin.  Skin getting thicker and scarred.  Tunnels under the skin (fistulas) where pus drains from a bump.  Pain during daily  activities, such as pain during walking if your groin area is affected.  Emotional problems, such as stress or depression. This condition may affect your appearance and your ability or willingness to wear certain clothes or do certain activities. How is this diagnosed? This condition is diagnosed by a health care provider who specializes in skin diseases (dermatologist). You may be diagnosed based on:  Your symptoms and medical history.  A physical exam.  Testing a pus sample for infection.  Blood tests. How is this treated? Your treatment will depend on how severe your symptoms are. The same treatment will not work for everybody with this condition. You may need to try several treatments to find what works best for you. Treatment may include:  Cleaning and bandaging (dressing) your wounds as needed.  Lifestyle changes, such as new skin care routines.  Taking medicines, such as: ? Antibiotics. ? Acne medicines. ? Medicines to reduce the activity of the immune system. ? A diabetes medicine (metformin). ? Birth control pills, for women. ? Steroids to reduce swelling and pain.  Working with a mental health care provider, if you experience emotional distress due to this condition. If you have severe symptoms that do not get better with medicine, you may need surgery. Surgery may involve:  Using a laser to clear the skin and remove hair follicles.  Opening and draining deep sores.  Removing the areas of skin that are diseased and scarred. Follow these instructions at home: Medicines   Take over-the-counter and prescription medicines only as told by your health care provider.  If you were prescribed  an antibiotic medicine, take it as told by your health care provider. Do not stop taking the antibiotic even if your condition improves. Skin care  If you have open wounds, cover them with a clean dressing as told by your health care provider. Keep wounds clean by washing them  gently with soap and water when you bathe.  Do not shave the areas where you get hidradenitis suppurativa.  Do not wear deodorant.  Wear loose-fitting clothes.  Try to avoid getting overheated or sweaty. If you get sweaty or wet, change into clean, dry clothes as soon as you can.  To help relieve pain and itchiness, cover sore areas with a warm, clean washcloth (warm compress) for 5-10 minutes as often as needed.  If told by your health care provider, take a bleach bath twice a week: ? Fill your bathtub halfway with water. ? Pour in  cup of unscented household bleach. ? Soak in the tub for 5-10 minutes. ? Only soak from the neck down. Avoid water on your face and hair. ? Shower to rinse off the bleach from your skin. General instructions  Learn as much as you can about your disease so that you have an active role in your treatment. Work closely with your health care provider to find treatments that work for you.  If you are overweight, work with your health care provider to lose weight as recommended.  Do not use any products that contain nicotine or tobacco, such as cigarettes and e-cigarettes. If you need help quitting, ask your health care provider.  If you struggle with living with this condition, talk with your health care provider or work with a mental health care provider as recommended.  Keep all follow-up visits as told by your health care provider. This is important. Where to find more information  Hidradenitis [place].: [email or web address]-foundation.org/ Contact a health care provider if you have:  A flare-up of hidradenitis suppurativa.  A fever or chills.  Trouble controlling your symptoms at home.  Trouble doing your daily activities because of your symptoms.  Trouble dealing with emotional problems related to your condition. Summary  Hidradenitis suppurativa is a long-term (chronic) skin disease. It is similar to a severe form of acne, but  it affects areas of the body where acne would be unusual.  The first symptoms are usually painful bumps in the skin, similar to pimples. The condition may get worse over time (progress), or it may only cause mild symptoms.  If you have open wounds, cover them with a clean dressing as told by your health care provider. Keep wounds clean by washing them gently with soap and water when you bathe.  Besides skin care, treatment may include medicines, laser treatment, and surgery. This information is not intended to replace advice given to you by your health care provider. Make sure you discuss any questions you have with your health care provider. Document Revised: 10/02/2017 Document Reviewed: 10/02/2017 Elsevier Patient Education  2020 [employer].

## 2020-02-24 NOTE — H&P (View-Only) (Signed)
Outpatient Surgical Follow Up  02/24/2020  Curtis Peterson is an 44 y.o. male.   Chief Complaint  Patient presents with  . Follow-up    Hidradenitis    HPI: Curtis Peterson is a 44 year old male well-known to me for hidradenitis.  He has completed antibiotic therapy and continues to have significant pain on the perineal area.  No fevers no chills.  There is some continuous drainage.  Pain is intermittent and worsening when she sits and moderate in intensity.  The pain is sharp.  Past Medical History:  Diagnosis Date  . Diabetes mellitus without complication ([place])   . Hypertension     Past Surgical History:  Procedure Laterality Date  . head lesions removed  2015  . INCISION AND DRAINAGE PERIRECTAL ABSCESS N/A 06/30/2019   Procedure: IRRIGATION AND DEBRIDEMENT PERIRECTAL ABSCESS;  Surgeon: Jules Husbands, MD;  Location: [facility] ORS;  Service: General;  Laterality: N/A;    Family History  Problem Relation Age of Onset  . Hypertension Other   . Diabetes Mother   . Diabetes Father     Social History:  reports that he has been smoking cigarettes. He has been smoking about 0.50 packs per day. He has never used smokeless tobacco. He reports that he does not drink alcohol or use drugs.  Allergies: No Known Allergies  Medications reviewed.    ROS Full ROS performed and is otherwise negative other than what is stated in HPI   BP 130/88   Pulse 73   Temp 97.7 F (36.5 C)   Resp 12   Ht 5\' 11"  (1.803 m)   Wt 230 lb 6.4 oz (104.5 kg)   SpO2 96%   BMI 32.13 kg/m   Physical Exam Vitals and nursing note reviewed. Exam conducted with a chaperone present.  Constitutional:      Appearance: Normal appearance. He is normal weight.  Cardiovascular:     Rate and Rhythm: Normal rate and regular rhythm.  Pulmonary:     Effort: Pulmonary effort is normal. No respiratory distress.     Breath sounds: Normal breath sounds. No stridor.  Abdominal:     General: Abdomen is flat. There is no  distension.     Palpations: Abdomen is soft. There is no mass.     Tenderness: There is no abdominal tenderness.  Genitourinary:    Comments: Significant right perineal hidradenitis, there is also an additional foci next to the gluteal clef., Induration, no abscess and no necrotizing infection Musculoskeletal:     Cervical back: Normal range of motion and neck supple. No rigidity.  Skin:    General: Skin is warm and dry.     Capillary Refill: Capillary refill takes less than 2 seconds.  Neurological:     General: No focal deficit present.     Mental Status: He is alert and oriented to person, place, and time.  Psychiatric:        Mood and Affect: Mood normal.        Behavior: Behavior normal.        Thought Content: Thought content normal.        Judgment: Judgment normal.     Assessment/Plan: 44 year old male with perineal hidradenitis that was not responsive to antibiotic therapy.  Given persistent symptoms and persistent induration I had a lengthy discussion with the patient regarding options and I do think he is better served with excision of hidradenitis and because of the area of inflammation infection this will probably need to be left to  heal by secondary intention.  The interim we will place him on broad-spectrum antibiotics again.  Procedure discussed with the patient in detail.  Risks, benefits and possible applications including but not limited to: Bleeding, infection recurrence and prolonged wound healing.  He understands and wished to proceed. There is 2 areas 1 of perineal and then in the gluteal cleft that will need to be addressed.    Greater than 50% of the 8minutes  visit was spent in counseling/coordination of care   Caroleen Hamman, MD [place] Surgeon

## 2020-02-24 NOTE — Progress Notes (Signed)
Outpatient Surgical Follow Up  02/24/2020  Curtis Peterson is an 44 y.o. male.   Chief Complaint  Patient presents with  . Follow-up    Hidradenitis    HPI: Curtis Peterson is a 44 year old male well-known to me for hidradenitis.  He has completed antibiotic therapy and continues to have significant pain on the perineal area.  No fevers no chills.  There is some continuous drainage.  Pain is intermittent and worsening when she sits and moderate in intensity.  The pain is sharp.  Past Medical History:  Diagnosis Date  . Diabetes mellitus without complication ([place])   . Hypertension     Past Surgical History:  Procedure Laterality Date  . head lesions removed  2015  . INCISION AND DRAINAGE PERIRECTAL ABSCESS N/A 06/30/2019   Procedure: IRRIGATION AND DEBRIDEMENT PERIRECTAL ABSCESS;  Surgeon: Jules Husbands, MD;  Location: [facility] ORS;  Service: General;  Laterality: N/A;    Family History  Problem Relation Age of Onset  . Hypertension Other   . Diabetes Mother   . Diabetes Father     Social History:  reports that he has been smoking cigarettes. He has been smoking about 0.50 packs per day. He has never used smokeless tobacco. He reports that he does not drink alcohol or use drugs.  Allergies: No Known Allergies  Medications reviewed.    ROS Full ROS performed and is otherwise negative other than what is stated in HPI   BP 130/88   Pulse 73   Temp 97.7 F (36.5 C)   Resp 12   Ht 5\' 11"  (1.803 m)   Wt 230 lb 6.4 oz (104.5 kg)   SpO2 96%   BMI 32.13 kg/m   Physical Exam Vitals and nursing note reviewed. Exam conducted with a chaperone present.  Constitutional:      Appearance: Normal appearance. He is normal weight.  Cardiovascular:     Rate and Rhythm: Normal rate and regular rhythm.  Pulmonary:     Effort: Pulmonary effort is normal. No respiratory distress.     Breath sounds: Normal breath sounds. No stridor.  Abdominal:     General: Abdomen is flat. There is no  distension.     Palpations: Abdomen is soft. There is no mass.     Tenderness: There is no abdominal tenderness.  Genitourinary:    Comments: Significant right perineal hidradenitis, there is also an additional foci next to the gluteal clef., Induration, no abscess and no necrotizing infection Musculoskeletal:     Cervical back: Normal range of motion and neck supple. No rigidity.  Skin:    General: Skin is warm and dry.     Capillary Refill: Capillary refill takes less than 2 seconds.  Neurological:     General: No focal deficit present.     Mental Status: He is alert and oriented to person, place, and time.  Psychiatric:        Mood and Affect: Mood normal.        Behavior: Behavior normal.        Thought Content: Thought content normal.        Judgment: Judgment normal.     Assessment/Plan: 44 year old male with perineal hidradenitis that was not responsive to antibiotic therapy.  Given persistent symptoms and persistent induration I had a lengthy discussion with the patient regarding options and I do think he is better served with excision of hidradenitis and because of the area of inflammation infection this will probably need to be left to  heal by secondary intention.  The interim we will place him on broad-spectrum antibiotics again.  Procedure discussed with the patient in detail.  Risks, benefits and possible applications including but not limited to: Bleeding, infection recurrence and prolonged wound healing.  He understands and wished to proceed. There is 2 areas 1 of perineal and then in the gluteal cleft that will need to be addressed.    Greater than 50% of the 53minutes  visit was spent in counseling/coordination of care   Caroleen Hamman, MD [place] Surgeon

## 2020-02-25 ENCOUNTER — Telehealth: Payer: Self-pay | Admitting: Surgery

## 2020-02-25 ENCOUNTER — Other Ambulatory Visit
Admission: RE | Admit: 2020-02-25 | Discharge: 2020-02-25 | Disposition: A | Discharge status: Home / Self Care | Payer: 59 | Source: Ambulatory Visit | Attending: Surgery | Admitting: Surgery

## 2020-02-25 DIAGNOSIS — Z01812 Encounter for preprocedural laboratory examination: Secondary | ICD-10-CM | POA: Insufficient documentation

## 2020-02-25 DIAGNOSIS — Z20822 Contact with and (suspected) exposure to covid-19: Secondary | ICD-10-CM | POA: Insufficient documentation

## 2020-02-25 LAB — SARS CORONAVIRUS 2 (TAT 6-24 HRS): SARS Coronavirus 2: NEGATIVE

## 2020-02-25 NOTE — Telephone Encounter (Signed)
Pt has been advised of Pre-Admission date/time, COVID Testing date and Surgery date.  Surgery Date: 02/26/20 Preadmission Testing Date: Per Caryl Pina in PreAdmit patient to arrive 7:30 to 8:00 am at same day surgery.    Covid Testing Date: 02/25/20 - patient advised to go to the [facility] [place] ([place]) before 10:30 am since add on for surgery.     Patient has been made aware to call [phone], between 1-3:00pm the day before surgery, to find out what time to arrive for surgery.

## 2020-02-26 ENCOUNTER — Ambulatory Visit: Payer: 59 | Admitting: Anesthesiology

## 2020-02-26 ENCOUNTER — Other Ambulatory Visit: Payer: Self-pay

## 2020-02-26 ENCOUNTER — Encounter: Payer: Self-pay | Admitting: Surgery

## 2020-02-26 ENCOUNTER — Encounter
Admission: RE | Disposition: A | Discharge status: Home / Self Care | Payer: Self-pay | Source: Home / Self Care | Attending: Surgery

## 2020-02-26 ENCOUNTER — Ambulatory Visit
Admission: RE | Admit: 2020-02-26 | Discharge: 2020-02-26 | Disposition: A | Discharge status: Home / Self Care | Payer: 59 | Attending: Surgery | Admitting: Surgery

## 2020-02-26 DIAGNOSIS — Z8249 Family history of ischemic heart disease and other diseases of the circulatory system: Secondary | ICD-10-CM | POA: Insufficient documentation

## 2020-02-26 DIAGNOSIS — L732 Hidradenitis suppurativa: Secondary | ICD-10-CM | POA: Diagnosis present

## 2020-02-26 DIAGNOSIS — E119 Type 2 diabetes mellitus without complications: Secondary | ICD-10-CM | POA: Insufficient documentation

## 2020-02-26 DIAGNOSIS — I10 Essential (primary) hypertension: Secondary | ICD-10-CM | POA: Insufficient documentation

## 2020-02-26 DIAGNOSIS — F17210 Nicotine dependence, cigarettes, uncomplicated: Secondary | ICD-10-CM | POA: Insufficient documentation

## 2020-02-26 DIAGNOSIS — Z833 Family history of diabetes mellitus: Secondary | ICD-10-CM | POA: Diagnosis not present

## 2020-02-26 HISTORY — PX: HYDRADENITIS EXCISION: SHX5243

## 2020-02-26 LAB — GLUCOSE, CAPILLARY
Glucose-Capillary: 138 mg/dL — ABNORMAL HIGH (ref 70–99)
Glucose-Capillary: 172 mg/dL — ABNORMAL HIGH (ref 70–99)

## 2020-02-26 SURGERY — EXAM UNDER ANESTHESIA
Anesthesia: General

## 2020-02-26 MED ORDER — FAMOTIDINE 20 MG PO TABS
ORAL_TABLET | ORAL | Status: AC
  Administered 2020-02-26: 20 mg via ORAL
  Filled 2020-02-26: qty 1

## 2020-02-26 MED ORDER — MEPERIDINE HCL 50 MG/ML IJ SOLN: 6.2500 mg | INTRAMUSCULAR | Status: DC | PRN

## 2020-02-26 MED ORDER — GABAPENTIN 300 MG PO CAPS
ORAL_CAPSULE | ORAL | Status: AC
  Administered 2020-02-26: 300 mg via ORAL
  Filled 2020-02-26: qty 1

## 2020-02-26 MED ORDER — PROPOFOL 10 MG/ML IV BOLUS
INTRAVENOUS | Status: AC
  Filled 2020-02-26: qty 20

## 2020-02-26 MED ORDER — LACTATED RINGERS IV SOLN: INTRAVENOUS | Status: DC

## 2020-02-26 MED ORDER — FENTANYL CITRATE (PF) 100 MCG/2ML IJ SOLN
INTRAMUSCULAR | Status: AC
  Filled 2020-02-26: qty 2

## 2020-02-26 MED ORDER — OXYCODONE HCL 5 MG PO TABS
ORAL_TABLET | ORAL | Status: AC
  Administered 2020-02-26: 5 mg via ORAL
  Filled 2020-02-26: qty 1

## 2020-02-26 MED ORDER — LABETALOL HCL 5 MG/ML IV SOLN
INTRAVENOUS | Status: DC | PRN
Start: 2020-02-26 — End: 2020-02-26
  Administered 2020-02-26: 10 mg via INTRAVENOUS

## 2020-02-26 MED ORDER — FENTANYL CITRATE (PF) 100 MCG/2ML IJ SOLN
INTRAMUSCULAR | Status: AC
  Administered 2020-02-26: 25 ug via INTRAVENOUS
  Filled 2020-02-26: qty 2

## 2020-02-26 MED ORDER — FENTANYL CITRATE (PF) 100 MCG/2ML IJ SOLN
25.0000 ug | INTRAMUSCULAR | Status: DC | PRN
  Administered 2020-02-26 (×3): 25 ug via INTRAVENOUS

## 2020-02-26 MED ORDER — LIDOCAINE HCL (CARDIAC) PF 100 MG/5ML IV SOSY
PREFILLED_SYRINGE | INTRAVENOUS | Status: DC | PRN
  Administered 2020-02-26: 100 mg via INTRAVENOUS

## 2020-02-26 MED ORDER — SUGAMMADEX SODIUM 200 MG/2ML IV SOLN
INTRAVENOUS | Status: DC | PRN
  Administered 2020-02-26: 400 mg via INTRAVENOUS

## 2020-02-26 MED ORDER — CELECOXIB 200 MG PO CAPS
ORAL_CAPSULE | ORAL | Status: AC
  Administered 2020-02-26: 200 mg via ORAL
  Filled 2020-02-26: qty 1

## 2020-02-26 MED ORDER — FAMOTIDINE 20 MG PO TABS: 20.0000 mg | ORAL_TABLET | Freq: Once | ORAL | Status: AC

## 2020-02-26 MED ORDER — GABAPENTIN 300 MG PO CAPS: 300.0000 mg | ORAL_CAPSULE | ORAL | Status: AC

## 2020-02-26 MED ORDER — SODIUM CHLORIDE 0.9 % IV SOLN
INTRAVENOUS | Status: AC
  Filled 2020-02-26: qty 2

## 2020-02-26 MED ORDER — SODIUM CHLORIDE 0.9 % IV SOLN
INTRAVENOUS | Status: DC | PRN
  Administered 2020-02-26: 50 mL

## 2020-02-26 MED ORDER — FENTANYL CITRATE (PF) 100 MCG/2ML IJ SOLN
INTRAMUSCULAR | Status: DC | PRN
  Administered 2020-02-26 (×4): 50 ug via INTRAVENOUS

## 2020-02-26 MED ORDER — ROCURONIUM BROMIDE 100 MG/10ML IV SOLN
INTRAVENOUS | Status: DC | PRN
  Administered 2020-02-26: 10 mg via INTRAVENOUS
  Administered 2020-02-26: 40 mg via INTRAVENOUS
  Administered 2020-02-26: 50 mg via INTRAVENOUS

## 2020-02-26 MED ORDER — BUPIVACAINE-EPINEPHRINE (PF) 0.25% -1:200000 IJ SOLN
INTRAMUSCULAR | Status: AC
  Filled 2020-02-26: qty 10

## 2020-02-26 MED ORDER — BUPIVACAINE LIPOSOME 1.3 % IJ SUSP
INTRAMUSCULAR | Status: AC
  Filled 2020-02-26: qty 20

## 2020-02-26 MED ORDER — OXYCODONE HCL 5 MG/5ML PO SOLN: 5.0000 mg | Freq: Once | ORAL | Status: AC | PRN

## 2020-02-26 MED ORDER — MIDAZOLAM HCL 2 MG/2ML IJ SOLN
INTRAMUSCULAR | Status: AC
  Filled 2020-02-26: qty 2

## 2020-02-26 MED ORDER — OXYCODONE HCL 5 MG PO TABS: 5.0000 mg | ORAL_TABLET | Freq: Once | ORAL | Status: AC | PRN

## 2020-02-26 MED ORDER — ACETAMINOPHEN 500 MG PO TABS: 1000.0000 mg | ORAL_TABLET | ORAL | Status: AC

## 2020-02-26 MED ORDER — ORAL CARE MOUTH RINSE: 15.0000 mL | Freq: Once | OROMUCOSAL | Status: DC

## 2020-02-26 MED ORDER — ALBUTEROL SULFATE HFA 108 (90 BASE) MCG/ACT IN AERS
INHALATION_SPRAY | RESPIRATORY_TRACT | Status: AC
  Filled 2020-02-26: qty 6.7

## 2020-02-26 MED ORDER — CHLORHEXIDINE GLUCONATE CLOTH 2 % EX PADS: 6.0000 | MEDICATED_PAD | Freq: Once | CUTANEOUS | Status: DC

## 2020-02-26 MED ORDER — MIDAZOLAM HCL 2 MG/2ML IJ SOLN
INTRAMUSCULAR | Status: DC | PRN
  Administered 2020-02-26: 2 mg via INTRAVENOUS

## 2020-02-26 MED ORDER — CELECOXIB 200 MG PO CAPS: 200.0000 mg | ORAL_CAPSULE | ORAL | Status: AC

## 2020-02-26 MED ORDER — ACETAMINOPHEN 500 MG PO TABS
ORAL_TABLET | ORAL | Status: AC
  Administered 2020-02-26: 1000 mg via ORAL
  Filled 2020-02-26: qty 2

## 2020-02-26 MED ORDER — PROMETHAZINE HCL 25 MG/ML IJ SOLN: 6.2500 mg | INTRAMUSCULAR | Status: DC | PRN

## 2020-02-26 MED ORDER — SODIUM CHLORIDE 0.9 % IV SOLN
2.0000 g | INTRAVENOUS | Status: AC
  Administered 2020-02-26: 2 g via INTRAVENOUS

## 2020-02-26 MED ORDER — PROPOFOL 10 MG/ML IV BOLUS
INTRAVENOUS | Status: DC | PRN
  Administered 2020-02-26: 200 mg via INTRAVENOUS

## 2020-02-26 MED ORDER — CHLORHEXIDINE GLUCONATE 0.12 % MT SOLN: 15.0000 mL | Freq: Once | OROMUCOSAL | Status: DC

## 2020-02-26 MED ORDER — HYDROCODONE-ACETAMINOPHEN 5-325 MG PO TABS
1.0000 | ORAL_TABLET | ORAL | 0 refills | Status: DC | PRN
Start: 2020-02-26 — End: 2020-03-14

## 2020-02-26 SURGICAL SUPPLY — 35 items
BLADE SURG 15 STRL LF DISP TIS (BLADE) ×1 IMPLANT
BLADE SURG 15 STRL SS (BLADE) ×1
BNDG GAUZE 1X2.1 STRL (MISCELLANEOUS) ×2 IMPLANT
CANISTER SUCT 1200ML W/VALVE (MISCELLANEOUS) ×2 IMPLANT
CANISTER SUCT 3000ML PPV (MISCELLANEOUS) ×2 IMPLANT
CHLORAPREP W/TINT 26 (MISCELLANEOUS) ×2 IMPLANT
COVER WAND RF STERILE (DRAPES) ×2 IMPLANT
DERMABOND ADVANCED (GAUZE/BANDAGES/DRESSINGS) ×1
DERMABOND ADVANCED .7 DNX12 (GAUZE/BANDAGES/DRESSINGS) ×1 IMPLANT
DRAPE 3/4 80X56 (DRAPES) ×2 IMPLANT
DRAPE LAPAROTOMY TRNSV 106X77 (MISCELLANEOUS) ×2 IMPLANT
DRAPE LEGGINS SURG 28X43 STRL (DRAPES) ×2 IMPLANT
DRAPE UNDER BUTTOCK W/FLU (DRAPES) ×2 IMPLANT
ELECT REM PT RETURN 9FT ADLT (ELECTROSURGICAL) ×2
ELECTRODE REM PT RTRN 9FT ADLT (ELECTROSURGICAL) ×1 IMPLANT
GAUZE PACKING 1/2X5YD (GAUZE/BANDAGES/DRESSINGS) ×2 IMPLANT
GAUZE PACKING IODOFORM 1/2 (PACKING) IMPLANT
GAUZE SPONGE 4X4 12PLY STRL (GAUZE/BANDAGES/DRESSINGS) ×2 IMPLANT
GLOVE BIO SURGEON STRL SZ7 (GLOVE) ×2 IMPLANT
GOWN STRL REUS W/ TWL LRG LVL3 (GOWN DISPOSABLE) ×2 IMPLANT
GOWN STRL REUS W/TWL LRG LVL3 (GOWN DISPOSABLE) ×2
LABEL OR SOLS (LABEL) ×2 IMPLANT
NEEDLE HYPO 22GX1.5 SAFETY (NEEDLE) ×2 IMPLANT
NS IRRIG 1000ML POUR BTL (IV SOLUTION) ×2 IMPLANT
NS IRRIG 500ML POUR BTL (IV SOLUTION) ×2 IMPLANT
PACK BASIN MINOR (MISCELLANEOUS) ×2 IMPLANT
SOL PREP PVP 2OZ (MISCELLANEOUS) ×2
SOLUTION PREP PVP 2OZ (MISCELLANEOUS) ×1 IMPLANT
SPONGE LAP 18X18 RF (DISPOSABLE) ×2 IMPLANT
SURGILUBE 2OZ TUBE FLIPTOP (MISCELLANEOUS) ×2 IMPLANT
SUT MNCRL 4-0 (SUTURE)
SUT MNCRL 4-0 27XMFL (SUTURE)
SUT VIC AB 2-0 CT2 27 (SUTURE) IMPLANT
SUTURE MNCRL 4-0 27XMF (SUTURE) IMPLANT
SYR 10ML LL (SYRINGE) ×4 IMPLANT

## 2020-02-26 NOTE — Op Note (Signed)
  02/26/2020  10:32 AM  PATIENT:  Daron Hegyi  44 y.o. male  PRE-OPERATIVE DIAGNOSIS: complex pilonidal disease perineum and sacrum  POST-OPERATIVE DIAGNOSIS:  Same  PROCEDURE: 1. Excision of hidradenitis Right Perineum and sacral area. Perial area measures 4.5x 4 cms, Sacral area measures 2.5 x 1.5 cms 2. Excisional debridement of skin subcutaneous tissue measuring  21.75  square centimeters    SURGEON:  Surgeon(s) and Role:    * Breezy Hertenstein F, MD - Primary   ANESTHESIA: GETA  INDICATIONS FOR PROCEDURE complex and extensive hidradenitis perineum and sacrum   DICTATION:  Patient was explained about the procedure in detail. Risks, benefits and possible complications and a consent was obtained. The patient was taken to the operating room, GETA was administered and patient was placed in the prone [facility] position with all pressure points padded. I identified an area on the right perineum and using a 15 blade knife perform the excision.  Cautery was used to remove all the involved plants within the subcutaneous tissue.  We then turned our attention to the sacral area where elliptical incision was done incorporated the hidradenitis.  Again cautery was used to obtain good hemostasis.  With a curette we debride the subcutaneous tissue on both areas and the total area measured 21.75 cm. Good hemostasis was obtained with cautery and we irrigated the wound with saline.  Liposomal Marcaine was infiltrated for postoperative analgesia 1 inch conform dressing was placed on both wounds. Needle and laparotomy counts were correct and there were no immediate complications  Jules Husbands, MD

## 2020-02-26 NOTE — Transfer of Care (Signed)
Immediate Anesthesia Transfer of Care Note  Patient: Curtis Peterson  Procedure(s) Performed: EXAM UNDER ANESTHESIA (N/A ) EXCISION HIDRADENITIS, Perineum (N/A )  Patient Location: PACU  Anesthesia Type:General  Level of Consciousness: awake, alert  and oriented  Airway & Oxygen Therapy: Patient Spontanous Breathing and Patient connected to face mask oxygen  Post-op Assessment: Report given to RN and Post -op Vital signs reviewed and stable  Post vital signs: Reviewed and stable  Last Vitals:  Vitals Value Taken Time  BP 130/93 02/26/20 1045  Temp 35.9 C 02/26/20 1045  Pulse 84 02/26/20 1052  Resp 16 02/26/20 1052  SpO2 96 % 02/26/20 1052  Vitals shown include unvalidated device data.  Last Pain:  Vitals:   02/26/20 1045  TempSrc:   PainSc: 6          Complications: No apparent anesthesia complications

## 2020-02-26 NOTE — Anesthesia Preprocedure Evaluation (Addendum)
Anesthesia Evaluation  Patient identified by MRN, date of birth, ID band Patient awake    Reviewed: Allergy & Precautions, NPO status , Patient's Chart, lab work & pertinent test results  History of Anesthesia Complications Negative for: history of anesthetic complications  Airway Mallampati: II  TM Distance: >3 FB Neck ROM: Full    Dental  (+) Poor Dentition   Pulmonary neg sleep apnea, neg COPD, Current Smoker and Patient abstained from smoking.,    breath sounds clear to auscultation- rhonchi (-) wheezing      Cardiovascular hypertension, Pt. on medications (-) CAD, (-) Past MI, (-) Cardiac Stents and (-) CABG  Rhythm:Regular Rate:Normal - Systolic murmurs and - Diastolic murmurs    Neuro/Psych neg Seizures negative neurological ROS  negative psych ROS   GI/Hepatic negative GI ROS, Neg liver ROS,   Endo/Other  diabetes  Renal/GU negative Renal ROS     Musculoskeletal negative musculoskeletal ROS (+)   Abdominal (+) + obese,   Peds  Hematology negative hematology ROS (+)   Anesthesia Other Findings Past Medical History: No date: Diabetes mellitus without complication (HCC) No date: Hypertension   Reproductive/Obstetrics                            Anesthesia Physical Anesthesia Plan  ASA: II  Anesthesia Plan: General   Post-op Pain Management:    Induction: Intravenous  PONV Risk Score and Plan: 0 and Ondansetron  Airway Management Planned: Oral ETT  Additional Equipment:   Intra-op Plan:   Post-operative Plan: Extubation in OR  Informed Consent: I have reviewed the patients History and Physical, chart, labs and discussed the procedure including the risks, benefits and alternatives for the proposed anesthesia with the patient or authorized representative who has indicated his/her understanding and acceptance.     Dental advisory given  Plan Discussed with: CRNA and  Anesthesiologist  Anesthesia Plan Comments:         Anesthesia Quick Evaluation

## 2020-02-26 NOTE — Interval H&P Note (Signed)
History and Physical Interval Note:  02/26/2020 8:41 AM  Curtis Peterson  has presented today for surgery, with the diagnosis of hidradenitis.  The various methods of treatment have been discussed with the patient and family. After consideration of risks, benefits and other options for treatment, the patient has consented to  Procedure(s): EXAM UNDER ANESTHESIA (N/A) EXCISION HIDRADENITIS, Perineum (N/A) as a surgical intervention.  The patient's history has been reviewed, patient examined, no change in status, stable for surgery.  I have reviewed the patient's chart and labs.  Questions were answered to the patient's satisfaction.     [place]

## 2020-02-26 NOTE — Anesthesia Procedure Notes (Signed)
Procedure Name: Intubation Date/Time: 02/26/2020 10:17 AM Performed by: Allean Found, CRNA Pre-anesthesia Checklist: Patient identified, Patient being monitored, Timeout performed, Emergency Drugs available and Suction available Patient Re-evaluated:Patient Re-evaluated prior to induction Oxygen Delivery Method: Circle system utilized Preoxygenation: Pre-oxygenation with 100% oxygen Induction Type: IV induction Ventilation: Mask ventilation without difficulty Laryngoscope Size: McGraph and 4 Grade View: Grade I Tube type: Oral Tube size: 7.5 mm Number of attempts: 1 Airway Equipment and Method: Stylet Placement Confirmation: ETT inserted through vocal cords under direct vision,  positive ETCO2 and breath sounds checked- equal and bilateral Secured at: 23 cm Tube secured with: Tape Dental Injury: Teeth and Oropharynx as per pre-operative assessment

## 2020-02-26 NOTE — Anesthesia Postprocedure Evaluation (Signed)
Anesthesia Post Note  Patient: Curtis Peterson  Procedure(s) Performed: EXAM UNDER ANESTHESIA (N/A ) EXCISION HIDRADENITIS, Perineum (N/A )  Patient location during evaluation: PACU Anesthesia Type: General Level of consciousness: awake and alert and oriented Pain management: pain level controlled Vital Signs Assessment: post-procedure vital signs reviewed and stable Respiratory status: spontaneous breathing, nonlabored ventilation and respiratory function stable Cardiovascular status: blood pressure returned to baseline and stable Postop Assessment: no signs of nausea or vomiting Anesthetic complications: no     Last Vitals:  Vitals:   02/26/20 1115 02/26/20 1130  BP: 119/82 108/87  Pulse: 79 78  Resp: 15 20  Temp:  (!) 36.1 C  SpO2: 94% 94%    Last Pain:  Vitals:   02/26/20 1130  TempSrc:   PainSc: 3                  Takuya Lariccia

## 2020-02-26 NOTE — Discharge Instructions (Signed)
AMBULATORY SURGERY  °DISCHARGE INSTRUCTIONS ° ° °1) The drugs that you were given will stay in your system until tomorrow so for the next 24 hours you should not: ° °A) Drive an automobile °B) Make any legal decisions °C) Drink any alcoholic beverage ° ° °2) You may resume regular meals tomorrow.  Today it is better to start with liquids and gradually work up to solid foods. ° °You may eat anything you prefer, but it is better to start with liquids, then soup and crackers, and gradually work up to solid foods. ° ° °3) Please notify your doctor immediately if you have any unusual bleeding, trouble breathing, redness and pain at the surgery site, drainage, fever, or pain not relieved by medication. ° ° ° °4) Additional Instructions: ° ° ° ° ° ° ° °Please contact your physician with any problems or Same Day Surgery at [phone], Monday through Friday 6 am to 4 pm, or [place] [facility] [place] Main number at [phone].AMBULATORY SURGERY  °DISCHARGE INSTRUCTIONS ° ° °5) The drugs that you were given will stay in your system until tomorrow so for the next 24 hours you should not: ° °D) Drive an automobile °E) Make any legal decisions °F) Drink any alcoholic beverage ° ° °6) You may resume regular meals tomorrow.  Today it is better to start with liquids and gradually work up to solid foods. ° °You may eat anything you prefer, but it is better to start with liquids, then soup and crackers, and gradually work up to solid foods. ° ° °7) Please notify your doctor immediately if you have any unusual bleeding, trouble breathing, redness and pain at the surgery site, drainage, fever, or pain not relieved by medication. ° ° ° °8) Additional Instructions: ° ° ° ° ° ° ° °Please contact your physician with any problems or Same Day Surgery at [phone], Monday through Friday 6 am to 4 pm, or [place] [facility] [place] Main number at [phone]. °

## 2020-02-29 ENCOUNTER — Ambulatory Visit: Payer: 59 | Admitting: Dermatology

## 2020-02-29 LAB — SURGICAL PATHOLOGY

## 2020-03-14 ENCOUNTER — Other Ambulatory Visit: Payer: Self-pay

## 2020-03-14 ENCOUNTER — Ambulatory Visit (INDEPENDENT_AMBULATORY_CARE_PROVIDER_SITE_OTHER): Payer: Self-pay | Admitting: Surgery

## 2020-03-14 ENCOUNTER — Encounter: Payer: Self-pay | Admitting: Surgery

## 2020-03-14 VITALS — BP 121/85 | HR 91 | Temp 97.9°F | Resp 14 | Ht 71.0 in | Wt 229.0 lb

## 2020-03-14 DIAGNOSIS — Z09 Encounter for follow-up examination after completed treatment for conditions other than malignant neoplasm: Secondary | ICD-10-CM

## 2020-03-14 MED ORDER — HYDROCODONE-ACETAMINOPHEN 5-325 MG PO TABS: 1.0000 | ORAL_TABLET | ORAL | 0 refills | Status: DC | PRN

## 2020-03-14 NOTE — Patient Instructions (Signed)
May use a large Band-Aid for the top wound. Continue with the gauze for the bottom wound. Continue daily dressing changes. May use Vaseline for itching.  Follow up here in 3 weeks.  May return to work on Monday.

## 2020-03-15 NOTE — Progress Notes (Signed)
S/p excision hidradenitis right perineum on gluteal cleft.  He is doing well. He apparently misplaced the narcotics prescription.  I have personally called the pharmacy and in fact they has not been any narcotics filled but the patient. Patient reported that after his surgery he was so groggy that he slept for a while and could not find the papers. He expresses some discharge.  Some pain but is slowly getting better.  No fevers no chills. PE : No acute distress.  Awake and alert.  Wounds healing well without evidence of infection.  No necrotizing infection.  A/P we will only do one-time refill of Norco.  Turn to clinic in 3 weeks.  No complications.

## 2020-03-17 ENCOUNTER — Telehealth: Payer: Self-pay | Admitting: *Deleted

## 2020-03-17 NOTE — Telephone Encounter (Signed)
Faxed FMLA to [place] at [phone]

## 2020-03-31 IMAGING — CT CT PELVIS W/ CM
2 of 3 series · 16 of 46 positions shown, 18 images · IV contrast (omnipaque)
Comparison: None.

CLINICAL DATA: Gluteal abscess

EXAM:
CT PELVIS WITH CONTRAST
TECHNIQUE: Multidetector CT imaging of the pelvis was performed using the
standard protocol following the bolus administration of intravenous
contrast.
CONTRAST:  100mL OMNIPAQUE IOHEXOL 300 MG/ML  SOLN

[Series 3: axial st · axial · 0.98mm/px · z∈[-1035,-755]mm · 13 of 162 slices shown, 15 images]
[im 11/162  soft-tissue]
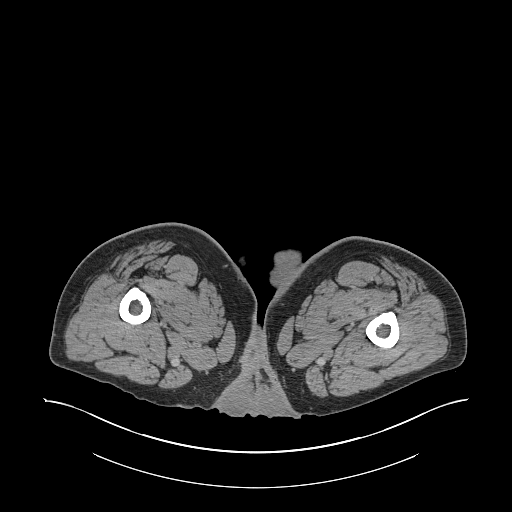
[im 11/162  bone]
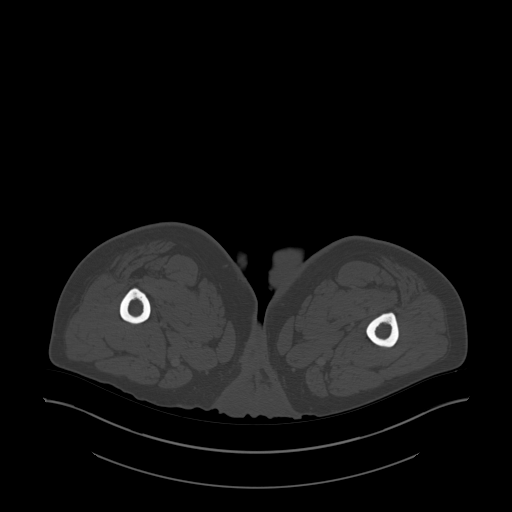
[im 21/162  soft-tissue]
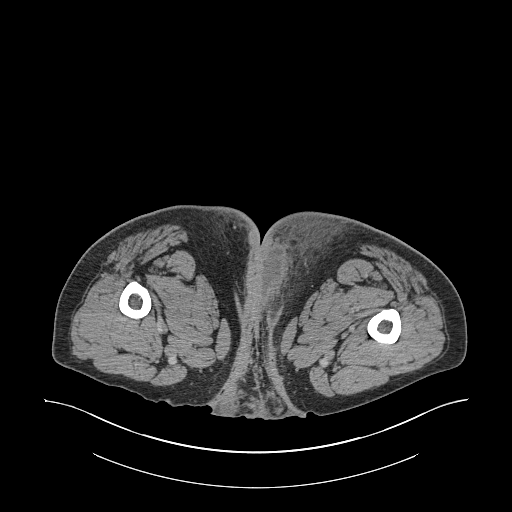
[im 32/162  soft-tissue]
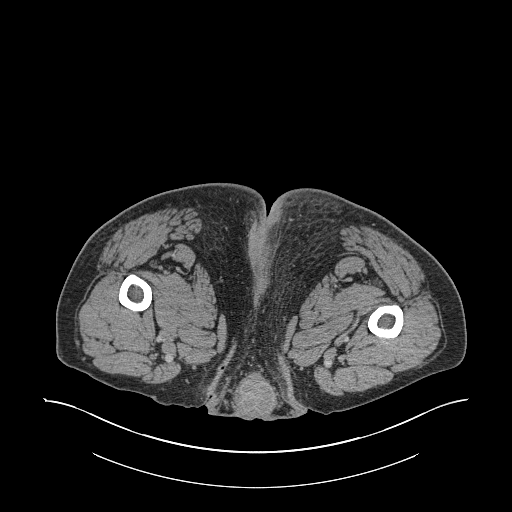
[im 47/162  soft-tissue]
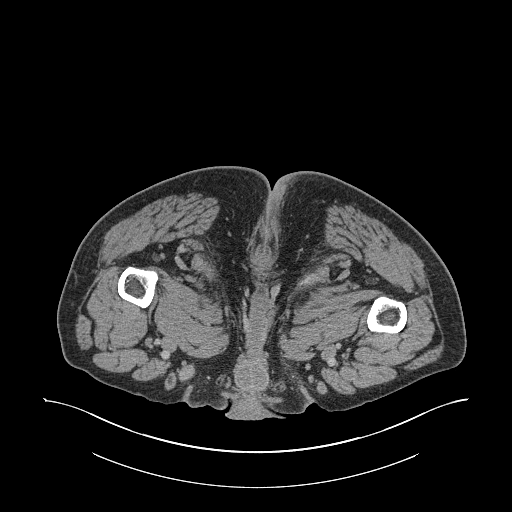
[im 58/162  soft-tissue]
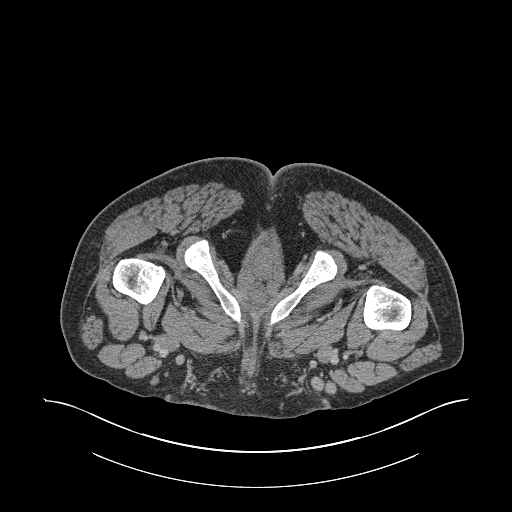
[im 68/162  soft-tissue]
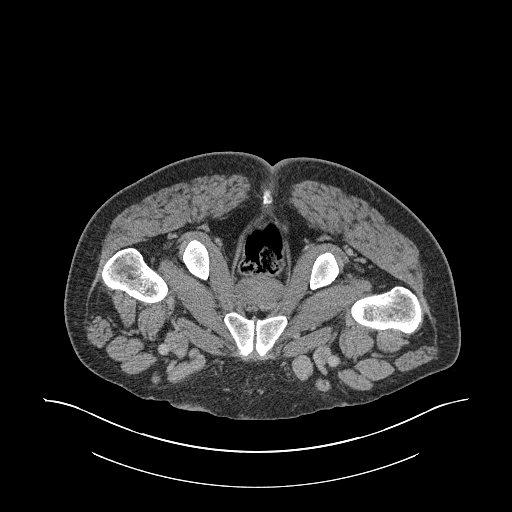
[im 84/162  soft-tissue]
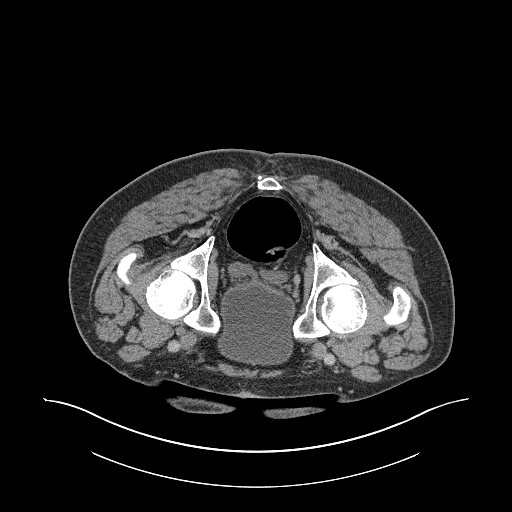
[im 94/162  soft-tissue]
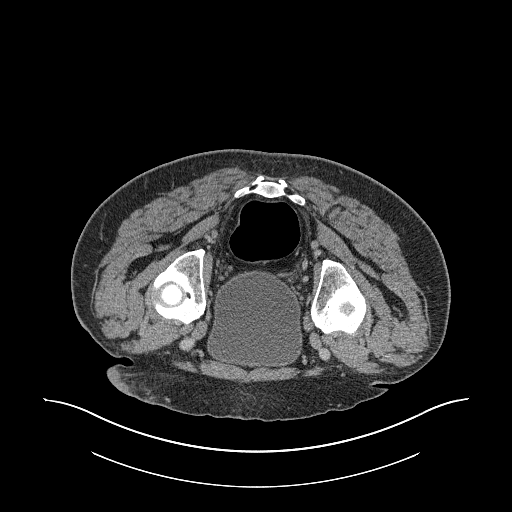
[im 104/162  soft-tissue]
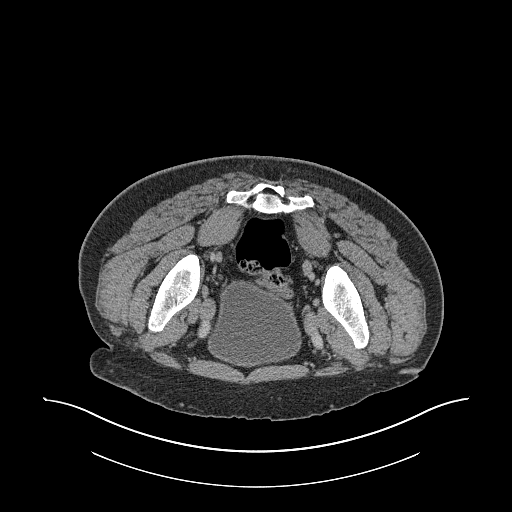
[im 104/162  bone]
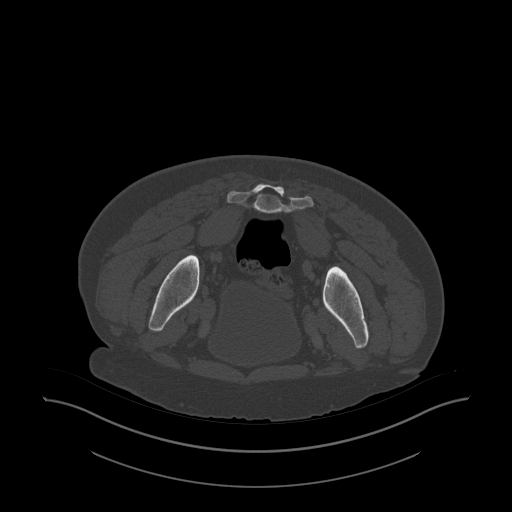
[im 115/162  soft-tissue]
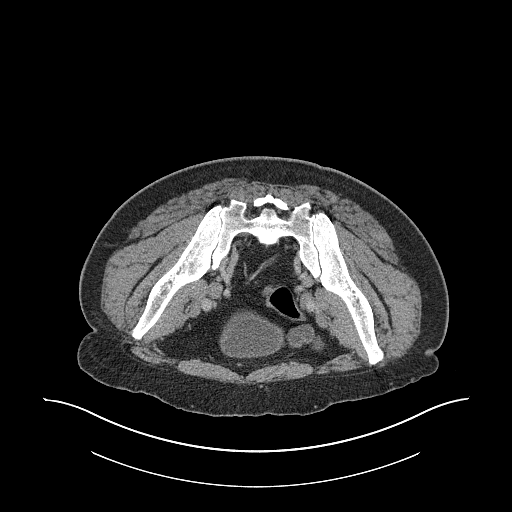
[im 130/162  soft-tissue]
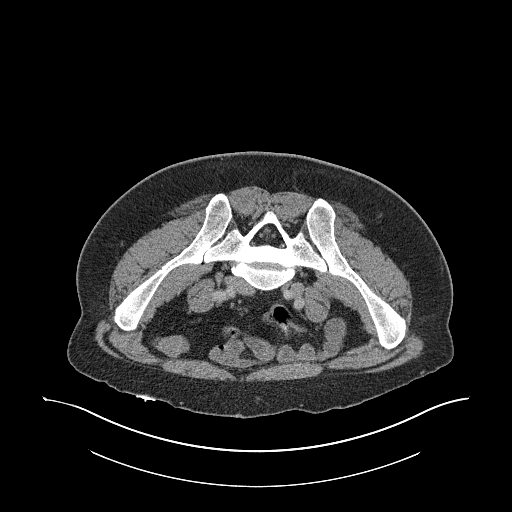
[im 141/162  soft-tissue]
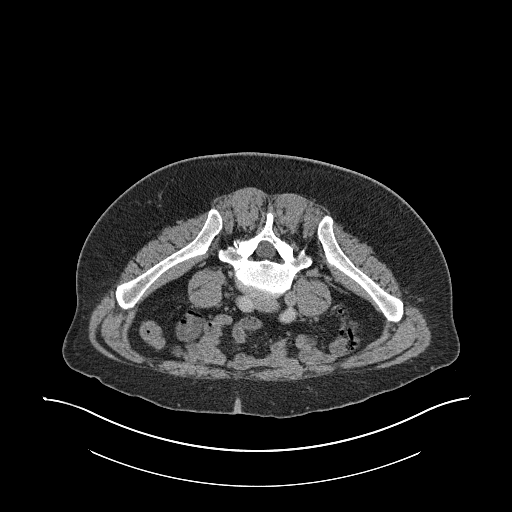
[im 151/162  soft-tissue]
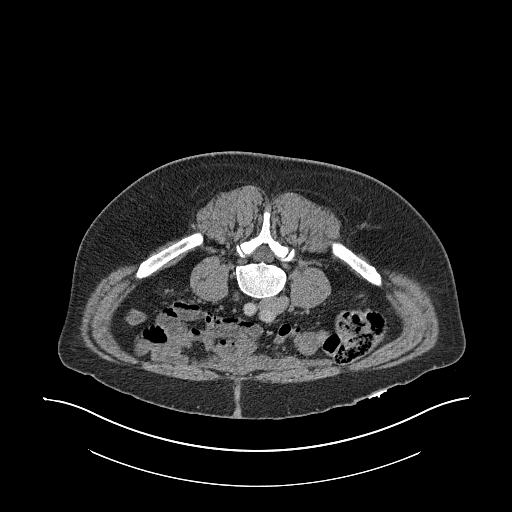

[Series 6: coronal st · coronal · 0.66mm/px · 3 of 132 slices shown]
[im 44/132  soft-tissue]
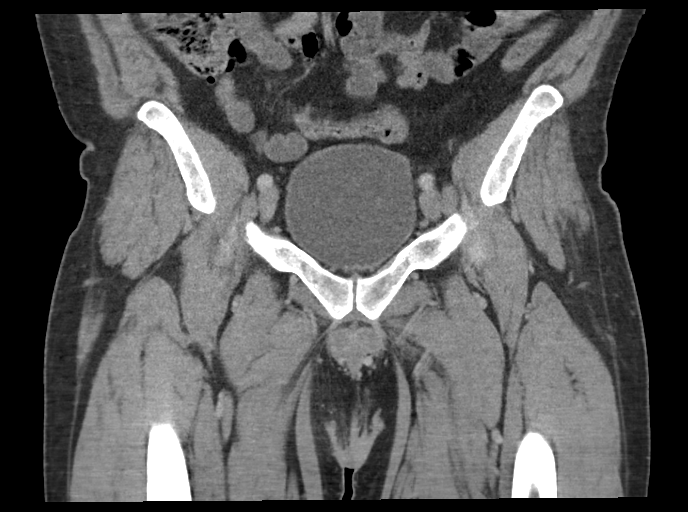
[im 59/132  soft-tissue]
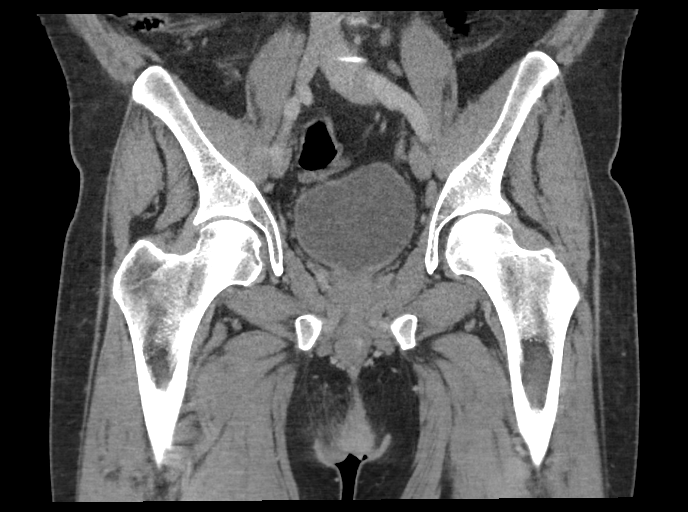
[im 73/132  soft-tissue]
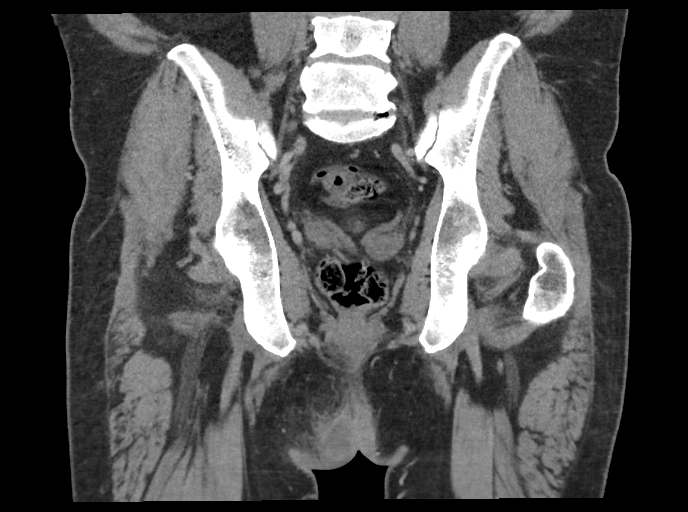

[16 of 46 positions shown; findings below may reference images not displayed]

FINDINGS: Urinary Tract:  No abnormality visualized.

Bowel:  Unremarkable visualized pelvic bowel loops.

Vascular/Lymphatic: Enlarged, reactive bilateral inguinal lymph
nodes without cavitation

Reproductive: Minimal subcutaneous low-density along the posterior
left scrotum without adjacent inflammatory changes, marked on axial
images.

Other: Gluteal and perianal cellulitic changes with right gluteal
abscess measuring 44 x 23 x 15 mm. A small component is seen
wrapping anteriorly around to the left aspect of the anus.

Musculoskeletal: No acute finding
IMPRESSION: 1. Gluteal and perianal cellulitis with 4.4 x 2.3 x 1.5 cm right
gluteal subcutaneous abscess. The collection wraps around the
anterior to left aspect of the anus. No supralevator inflammation.
2. Minimal subcutaneous low-density at the level of the posterior
left scrotum is of indeterminate relationship to #1 as there is no
adjacent cutaneous inflammatory changes.

## 2020-04-04 ENCOUNTER — Encounter: Payer: Self-pay | Admitting: Surgery

## 2020-04-04 ENCOUNTER — Other Ambulatory Visit: Payer: Self-pay

## 2020-04-04 ENCOUNTER — Ambulatory Visit (INDEPENDENT_AMBULATORY_CARE_PROVIDER_SITE_OTHER): Payer: 59 | Admitting: Surgery

## 2020-04-04 VITALS — BP 131/84 | HR 93 | Temp 98.1°F | Ht 71.0 in | Wt 233.0 lb

## 2020-04-04 DIAGNOSIS — Z09 Encounter for follow-up examination after completed treatment for conditions other than malignant neoplasm: Secondary | ICD-10-CM

## 2020-04-04 NOTE — Patient Instructions (Addendum)
Follow-up with our office as needed.  Please call and ask to speak with a nurse if you develop questions or concerns.   GENERAL POST-OPERATIVE PATIENT INSTRUCTIONS   FOLLOW-UP:  Please make an appointment with your physician in.  Call your physician immediately if you have any fevers greater than 102.5, drainage from you wound that is not clear or looks infected, persistent bleeding, increasing abdominal pain, problems urinating, or persistent nausea/vomiting.    WOUND CARE INSTRUCTIONS:  Keep a dry clean dressing on the wound if there is drainage. The initial bandage may be removed after 24 hours.  Once the wound has quit draining you may leave it open to air.  If clothing rubs against the wound or causes irritation and the wound is not draining you may cover it with a dry dressing during the daytime.  Try to keep the wound dry and avoid ointments on the wound unless directed to do so.  If the wound becomes bright red and painful or starts to drain infected material that is not clear, please contact your physician immediately.  If the wound is mildly pink and has a thick firm ridge underneath it, this is normal, and is referred to as a healing ridge.  This will resolve over the next 4-6 weeks.  DIET:  You may eat any foods that you can tolerate.  It is a good idea to eat a high fiber diet and take in plenty of fluids to prevent constipation.  If you do become constipated you may want to take a mild laxative or take ducolax tablets on a daily basis until your bowel habits are regular.  Constipation can be very uncomfortable, along with straining, after recent surgery.  ACTIVITY:  You are encouraged to cough and deep breath or use your incentive spirometer if you were given one, every 15-30 minutes when awake.  This will help prevent respiratory complications and low grade fevers post-operatively if you had a general anesthetic.  You may want to hug a pillow when coughing and sneezing to add additional  support to the surgical area, if you had abdominal or chest surgery, which will decrease pain during these times.  You are encouraged to walk and engage in light activity for the next two weeks.  You should not lift more than 20 pounds during this time frame as it could put you at increased risk for complications.  Twenty pounds is roughly equivalent to a plastic bag of groceries.    MEDICATIONS:  Try to take narcotic medications and anti-inflammatory medications, such as tylenol, ibuprofen, naprosyn, etc., with food.  This will minimize stomach upset from the medication.  Should you develop nausea and vomiting from the pain medication, or develop a rash, please discontinue the medication and contact your physician.  You should not drive, make important decisions, or operate machinery when taking narcotic pain medication.  QUESTIONS:  Please feel free to call your physician or the hospital operator if you have any questions, and they will be glad to assist you.   

## 2020-04-05 ENCOUNTER — Encounter: Payer: Self-pay | Admitting: Surgery

## 2020-04-05 NOTE — Progress Notes (Signed)
S/p excision hidradenitis right perineum on gluteal cleft.  He is doing well. Minimal drainage, no fevers or chills  PE NAD Wounds healing well and almost completely healed. No infection  A/P Doing well continue dry dressing daily RTC prn

## 2020-05-03 NOTE — Progress Notes (Deleted)
05/04/2020 4:15 PM   Curtis Peterson January 07, 1976 [id number]  Referring provider: Tracie Peterson, [facility] [place],  [place] [address]  No chief complaint on file.   HPI: Curtis Peterson is a 44 year old male with a history of hidradenitis and several I & D of perineal abscess is referred by Dr. Hampton Peterson for a swollen foreskin.  He was seen by Dr. Hampton Peterson yesterday for symptoms of two weeks of swelling of his foreskin limiting the ability to retract the foreskin past the glans.  He was started on Bactrim DS and referred to [place].    He stated he started to have penile tenderness that started 2 weeks ago.  He states that he has increasingly been unable to pull back the foreskin to aid in urination and cleaning the head of his penis.  He has noted some blood on his underwear, but he denies any purulent discharge.  He is sexually active, but he states he is a monogamous relationship with his girlfriend.  Patient denies any gross hematuria, dysuria or suprapubic/flank pain.  Patient denies any fevers, chills, nausea or vomiting.  He is a diet-controlled diabetic.  His UA is orange cloudy, trace blood, 2+ protein, 1+ leukocytes and 6-10 WBCs, 0-2 RBCs, 0-10 epithelial cells, and few bacteria.  His PVR is 0 mL.    PMH: Past Medical History:  Diagnosis Date  . Diabetes mellitus without complication ([place])   . Hypertension     Surgical History: Past Surgical History:  Procedure Laterality Date  . head lesions removed  2015  . HYDRADENITIS EXCISION N/A 02/26/2020   Procedure: EXCISION HIDRADENITIS, Perineum;  Surgeon: Curtis Husbands, MD;  Location: [facility] ORS;  Service: General;  Laterality: N/A;  . INCISION AND DRAINAGE PERIRECTAL ABSCESS N/A 06/30/2019   Procedure: IRRIGATION AND DEBRIDEMENT PERIRECTAL ABSCESS;  Surgeon: Curtis Husbands, MD;  Location: [facility] ORS;  Service: General;  Laterality: N/A;    Home Medications:  Allergies as of 05/04/2020   No Known  Allergies     Medication List       Accurate as of May 03, 2020  4:15 PM. If you have any questions, ask your nurse or doctor.        HYDROcodone-acetaminophen 5-325 MG tablet Commonly known as: NORCO/VICODIN Take 1-2 tablets by mouth every 4 (four) hours as needed for moderate pain.   ISOtretinoin 20 MG capsule Commonly known as: ACCUTANE Take one 20 mg tab po QD with a fatty meal. D/C Doxycycline. IPledge #[id number]   ISOtretinoin 30 MG capsule Commonly known as: ACCUTANE Take one cap po QD with food   lisinopril 2.5 MG tablet Commonly known as: ZESTRIL Take 2.5 mg by mouth daily.       Allergies: No Known Allergies  Family History: Family History  Problem Relation Age of Onset  . Hypertension Other   . Diabetes Mother   . Diabetes Father     Social History:  reports that he has been smoking cigarettes. He has been smoking about 0.50 packs per day. He has never used smokeless tobacco. He reports that he does not drink alcohol and does not use drugs.  ROS: For pertinent review of systems please refer to history of present illness  Physical Exam: There were no vitals taken for this visit.  Constitutional:  Well nourished. Alert and oriented, No acute distress. HEENT: Curtis Peterson AT, moist mucus membranes.  Trachea midline Cardiovascular: No clubbing, cyanosis, or edema. Respiratory: Normal respiratory effort,  no increased work of breathing. GI: Abdomen is soft, non tender, non distended, no abdominal masses. Liver and spleen not palpable.  No hernias appreciated.  Stool sample for occult testing is not indicated.   GU: No CVA tenderness.  No bladder fullness or masses.  Patient with circumcised/uncircumcised phallus. ***Foreskin easily retracted***  Urethral meatus is patent.  No penile discharge. No penile lesions or rashes. Scrotum without lesions, cysts, rashes and/or edema.  Testicles are located scrotally bilaterally. No masses are appreciated in the testicles. Left  and right epididymis are normal. Rectal: Patient with  normal sphincter tone. Anus and perineum without scarring or rashes. No rectal masses are appreciated. Prostate is approximately *** grams, *** nodules are appreciated. Seminal vesicles are normal. Skin: No rashes, bruises or suspicious lesions. Lymph: No inguinal adenopathy. Neurologic: Grossly intact, no focal deficits, moving all 4 extremities. Psychiatric: Normal mood and affect.  Laboratory Data: Results for orders placed or performed during the hospital encounter of 02/26/20  Glucose, capillary  Result Value Ref Range   Glucose-Capillary 138 (H) 70 - 99 mg/dL  Glucose, capillary  Result Value Ref Range   Glucose-Capillary 172 (H) 70 - 99 mg/dL  Surgical pathology  Result Value Ref Range   SURGICAL PATHOLOGY      SURGICAL PATHOLOGY CASE: [id number] PATIENT: Curtis Peterson Surgical Pathology Report     Specimen Submitted: A. Hydradenitis, sacral B. Hydradenitis, perineum  Clinical History: Hidradenitis    DIAGNOSIS: A. SKIN AND SUBCUTANEOUS TISSUE, SACRAL; EXCISION: - BENIGN SKIN AND SUBCUTANEOUS TISSUE WITH ABSCESS FORMATION, COMPATIBLE WITH HIDRADENITIS SUPPURATIVA. - ACUTE INFLAMMATION EXTENDS TO DEEP ASPECT OF EXCISION. - NEGATIVE FOR MALIGNANCY.  B. SKIN AND SUBCUTANEOUS TISSUE, PERINEAL; EXCISION: - BENIGN SKIN AND SUBCUTANEOUS TISSUE WITH ABSCESS FORMATION, COMPATIBLE WITH HIDRADENITIS SUPPURATIVA. - ACUTE INFLAMMATION EXTENDS TO DEEP ASPECT OF EXCISION. - NEGATIVE FOR MALIGNANCY.  GROSS DESCRIPTION: A. Labeled: Sacral hidradenitis Received: Formalin Tissue fragment(s): 1 Size: 2.5 x 1.1 cm excised to a depth of 1.2 cm Description: Received is an unoriented, elliptical excision of skin with underlying subcutaneous tissue.  The surgical resection margi n is inked blue.  The skin surface is tan-brown and grossly unremarkable. Sectioning displays a 0.6 x 0.6 x 0.3 cm focal area of  Peterson-red discoloration and softening.  This area abuts the blue inked surgical resection margin.  The remaining cut surface displays a diffuse, pale-tan fibrotic appearance.  No additional abnormalities are grossly notified.  Representative sections are submitted in cassette 1.  B. Labeled: Hidradenitis, perineum Received: Formalin Tissue fragment(s): 1 Size: 3.2 x 3.0 cm excised to a depth of 1.3 cm Description: Received is an irregular, unoriented excision of skin with underlying subcutaneous tissue.  The surgical resection margin is inked blue.  The skin surface displays multifocal areas of slight pale-tan discoloration and irregularity ranging from 0.2 to 0.4 cm in greatest dimension.  These areas come within 0.1 cm of the closest skin surgical resection margin.  The remaining skin surface is tan-brown and otherwise grossly unremarkabl e.  Sectioning displays multifocal areas of Peterson-red discoloration and softening ranging from 0.2 to 1.0 cm in greatest dimension.  These areas abut the blue inked surgical resection margin. The remaining cut surface displays a diffuse pale-tan fibrotic appearance.  No additional abnormalities are grossly identified. Representative sections are submitted in cassettes 1-2.    Final Diagnosis performed by Allena Napoleon, MD.   Electronically signed 02/29/2020 8:52:13AM The electronic signature indicates that the named Attending Pathologist has evaluated the specimen Technical component performed  at [facility], [address] [place], [place] [address] Lab: [phone] Dir: Rush Farmer, MD, MMM  Professional component performed at [facility], [facility], [place], [place], [place] [address] Lab: [phone] Dir: Dellia Nims. Rubinas, MD    Lab Results  Component Value Date   WBC 7.2 07/02/2019   HGB 11.8 (L) 07/02/2019   HCT 37.0 (L) 07/02/2019   MCV 101.9 (H) 07/02/2019   PLT 213 07/02/2019    Lab Results  Component  Value Date   CREATININE 0.45 (L) 07/01/2019    Lab Results  Component Value Date   HGBA1C 6.0 (H) 06/30/2019    Lab Results  Component Value Date   TSH 2.052 06/30/2019    Lab Results  Component Value Date   AST 35 06/30/2019   Lab Results  Component Value Date   ALT 42 06/30/2019   I have reviewed the labs.   Pertinent Imaging: Results for EJ, PINSON (MRN [id number]) as of 10/20/2019 12:53  Ref. Range 10/20/2019 11:19  Scan Result Unknown 0    Assessment & Plan:    1. 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  These notes generated with voice recognition software. I apologize for typographical errors.  Zara Council, PA-C  [facility] [address]  [place], [place] [address] [phone]

## 2020-05-04 ENCOUNTER — Ambulatory Visit: Payer: Self-pay | Admitting: Urology

## 2020-09-02 ENCOUNTER — Inpatient Hospital Stay
Admission: EM | Admit: 2020-09-02 | Discharge: 2020-09-07 | DRG: 571 | Disposition: A | Discharge status: Home Health | Payer: 59 | Attending: Internal Medicine | Admitting: Internal Medicine

## 2020-09-02 ENCOUNTER — Emergency Department: Payer: 59

## 2020-09-02 ENCOUNTER — Other Ambulatory Visit: Payer: Self-pay

## 2020-09-02 DIAGNOSIS — Z833 Family history of diabetes mellitus: Secondary | ICD-10-CM

## 2020-09-02 DIAGNOSIS — D7589 Other specified diseases of blood and blood-forming organs: Secondary | ICD-10-CM | POA: Diagnosis present

## 2020-09-02 DIAGNOSIS — L7622 Postprocedural hemorrhage and hematoma of skin and subcutaneous tissue following other procedure: Secondary | ICD-10-CM | POA: Diagnosis not present

## 2020-09-02 DIAGNOSIS — E669 Obesity, unspecified: Secondary | ICD-10-CM | POA: Diagnosis present

## 2020-09-02 DIAGNOSIS — L732 Hidradenitis suppurativa: Secondary | ICD-10-CM | POA: Diagnosis present

## 2020-09-02 DIAGNOSIS — L03311 Cellulitis of abdominal wall: Secondary | ICD-10-CM | POA: Diagnosis present

## 2020-09-02 DIAGNOSIS — Z8249 Family history of ischemic heart disease and other diseases of the circulatory system: Secondary | ICD-10-CM

## 2020-09-02 DIAGNOSIS — D1779 Benign lipomatous neoplasm of other sites: Secondary | ICD-10-CM | POA: Diagnosis present

## 2020-09-02 DIAGNOSIS — Z6832 Body mass index (BMI) 32.0-32.9, adult: Secondary | ICD-10-CM

## 2020-09-02 DIAGNOSIS — I10 Essential (primary) hypertension: Secondary | ICD-10-CM | POA: Diagnosis present

## 2020-09-02 DIAGNOSIS — Z20822 Contact with and (suspected) exposure to covid-19: Secondary | ICD-10-CM | POA: Diagnosis present

## 2020-09-02 DIAGNOSIS — E119 Type 2 diabetes mellitus without complications: Secondary | ICD-10-CM | POA: Diagnosis present

## 2020-09-02 DIAGNOSIS — F17210 Nicotine dependence, cigarettes, uncomplicated: Secondary | ICD-10-CM | POA: Diagnosis present

## 2020-09-02 DIAGNOSIS — Y838 Other surgical procedures as the cause of abnormal reaction of the patient, or of later complication, without mention of misadventure at the time of the procedure: Secondary | ICD-10-CM | POA: Diagnosis not present

## 2020-09-02 DIAGNOSIS — B954 Other streptococcus as the cause of diseases classified elsewhere: Secondary | ICD-10-CM | POA: Diagnosis present

## 2020-09-02 DIAGNOSIS — Z23 Encounter for immunization: Secondary | ICD-10-CM

## 2020-09-02 DIAGNOSIS — L0231 Cutaneous abscess of buttock: Secondary | ICD-10-CM

## 2020-09-02 DIAGNOSIS — L03314 Cellulitis of groin: Secondary | ICD-10-CM | POA: Diagnosis present

## 2020-09-02 DIAGNOSIS — L03317 Cellulitis of buttock: Secondary | ICD-10-CM

## 2020-09-02 DIAGNOSIS — Z79899 Other long term (current) drug therapy: Secondary | ICD-10-CM

## 2020-09-02 LAB — CBC WITH DIFFERENTIAL/PLATELET
Abs Immature Granulocytes: 0.02 10*3/uL (ref 0.00–0.07)
Basophils Absolute: 0.1 10*3/uL (ref 0.0–0.1)
Basophils Relative: 1 %
Eosinophils Absolute: 0.2 10*3/uL (ref 0.0–0.5)
Eosinophils Relative: 2 %
HCT: 50.7 % (ref 39.0–52.0)
Hemoglobin: 16.7 g/dL (ref 13.0–17.0)
Immature Granulocytes: 0 %
Lymphocytes Relative: 32 %
Lymphs Abs: 2.9 10*3/uL (ref 0.7–4.0)
MCH: 33.1 pg (ref 26.0–34.0)
MCHC: 32.9 g/dL (ref 30.0–36.0)
MCV: 100.6 fL — ABNORMAL HIGH (ref 80.0–100.0)
Monocytes Absolute: 0.5 10*3/uL (ref 0.1–1.0)
Monocytes Relative: 6 %
Neutro Abs: 5.4 10*3/uL (ref 1.7–7.7)
Neutrophils Relative %: 59 %
Platelets: 296 10*3/uL (ref 150–400)
RBC: 5.04 MIL/uL (ref 4.22–5.81)
RDW: 13.2 % (ref 11.5–15.5)
WBC: 9.2 10*3/uL (ref 4.0–10.5)
nRBC: 0 % (ref 0.0–0.2)

## 2020-09-02 LAB — COMPREHENSIVE METABOLIC PANEL
ALT: 42 U/L (ref 0–44)
AST: 37 U/L (ref 15–41)
Albumin: 3.3 g/dL — ABNORMAL LOW (ref 3.5–5.0)
Alkaline Phosphatase: 83 U/L (ref 38–126)
Anion gap: 10 (ref 5–15)
BUN: 11 mg/dL (ref 6–20)
CO2: 23 mmol/L (ref 22–32)
Calcium: 9.5 mg/dL (ref 8.9–10.3)
Chloride: 103 mmol/L (ref 98–111)
Creatinine, Ser: 0.66 mg/dL (ref 0.61–1.24)
GFR, Estimated: >60 mL/min (ref >60)
Glucose, Bld: 125 mg/dL — ABNORMAL HIGH (ref 70–99)
Potassium: 4 mmol/L (ref 3.5–5.1)
Sodium: 136 mmol/L (ref 135–145)
Total Bilirubin: 0.5 mg/dL (ref 0.3–1.2)
Total Protein: 8.8 g/dL — ABNORMAL HIGH (ref 6.5–8.1)

## 2020-09-02 LAB — LACTIC ACID, PLASMA: Lactic Acid, Venous: 1.7 mmol/L (ref 0.5–1.9)

## 2020-09-02 MED ORDER — PIPERACILLIN-TAZOBACTAM 3.375 G IVPB 30 MIN
3.3750 g | Freq: Once | INTRAVENOUS | Status: AC
  Administered 2020-09-03: 3.375 g via INTRAVENOUS
  Filled 2020-09-02: qty 50

## 2020-09-02 MED ORDER — SODIUM CHLORIDE 0.9 % IV BOLUS
1000.0000 mL | Freq: Once | INTRAVENOUS | Status: AC
  Administered 2020-09-03: 1000 mL via INTRAVENOUS

## 2020-09-02 MED ORDER — VANCOMYCIN HCL IN DEXTROSE 1-5 GM/200ML-% IV SOLN
1000.0000 mg | Freq: Once | INTRAVENOUS | Status: AC
  Administered 2020-09-03: 1000 mg via INTRAVENOUS
  Filled 2020-09-02: qty 200

## 2020-09-02 MED ORDER — MORPHINE SULFATE (PF) 4 MG/ML IV SOLN
4.0000 mg | Freq: Once | INTRAVENOUS | Status: AC
  Administered 2020-09-03: 4 mg via INTRAVENOUS
  Filled 2020-09-02: qty 1

## 2020-09-02 MED ORDER — IOHEXOL 300 MG/ML  SOLN
100.0000 mL | Freq: Once | INTRAMUSCULAR | Status: AC | PRN
  Administered 2020-09-02: 100 mL via INTRAVENOUS

## 2020-09-02 MED ORDER — IOHEXOL 9 MG/ML PO SOLN: 500.0000 mL | ORAL | Status: AC

## 2020-09-02 MED ORDER — ONDANSETRON HCL 4 MG/2ML IJ SOLN
4.0000 mg | Freq: Once | INTRAMUSCULAR | Status: AC
  Administered 2020-09-03: 4 mg via INTRAVENOUS
  Filled 2020-09-02: qty 2

## 2020-09-02 NOTE — ED Notes (Signed)
Pt to ct 

## 2020-09-02 NOTE — ED Triage Notes (Signed)
Pt to ED via EMS for groin pain that started 2 months ago and has been worsening. States he suffers from severe acne and started having acne to groin area that became too painful and unable to dermatologist until Thursday. Reports area is purple and black.   Area above penis noted to be swollen with discoloration and pus, with skin irritation going to upper thigh. Reports no difficulty urinating or defecating  Pt appears very uncomfortable  First nurse Apolonio Schneiders aware

## 2020-09-02 NOTE — ED Provider Notes (Signed)
[facility] Emergency Department Provider Note   ____________________________________________   First MD Initiated Contact with Patient 09/02/20 2317     (approximate)  I have reviewed the triage vital signs and the nursing notes.   HISTORY  Chief Complaint Groin Pain    HPI Curtis Peterson is a 44 y.o. male brought to the ED via EMS from home with a chief complaint of groin pain.  Patient with a history of hidradenitis suppurativa status post excision in May 2021.  Reports a 70-month history of right groin pain which has been worsening.  States he has not been to see the doctor because he had no insurance.  Recently obtained medical insurance and wanted evaluation.  Denies fever, chills, chest pain, shortness of breath, abdominal pain, nausea or vomiting.  Denies dysuria, difficulty having bowel movements.  Denies testicular pain or swelling.  States he is no longer diabetic as his hemoglobin A1c was less than 5 several months ago.     Past Medical History:  Diagnosis Date  . Diabetes mellitus without complication ([place])   . Hypertension     Patient Active Problem List   Diagnosis Date Noted  . Abdominal wall cellulitis 09/03/2020  . Abscess and cellulitis of gluteal region 06/30/2019  . Cellulitis and abscess of buttock 06/30/2019  . Abnormal LFTs (liver function tests) 06/26/2019  . Benign fasciculations 06/26/2019  . Benign essential hypertension 10/29/2018  . Hepatic steatosis 10/29/2018  . Type 2 diabetes mellitus with hyperglycemia, without long-term current use of insulin ([place]) 10/29/2018    Past Surgical History:  Procedure Laterality Date  . head lesions removed  2015  . HYDRADENITIS EXCISION N/A 02/26/2020   Procedure: EXCISION HIDRADENITIS, Perineum;  Surgeon: Jules Husbands, MD;  Location: [facility] ORS;  Service: General;  Laterality: N/A;  . INCISION AND DRAINAGE PERIRECTAL ABSCESS N/A 06/30/2019   Procedure: IRRIGATION AND DEBRIDEMENT  PERIRECTAL ABSCESS;  Surgeon: Jules Husbands, MD;  Location: [facility] ORS;  Service: General;  Laterality: N/A;    Prior to Admission medications   Medication Sig Start Date End Date Taking? Authorizing Provider  acetaminophen (TYLENOL) 325 MG tablet Take 650 mg by mouth every 6 (six) hours as needed for moderate pain.   Yes [provider]  lisinopril (ZESTRIL) 2.5 MG tablet Take 2.5 mg by mouth daily.    Yes [provider]  valACYclovir (VALTREX) 500 MG tablet Take 500 mg by mouth daily. 08/27/20  Yes [provider]    Allergies Patient has no known allergies.  Family History  Problem Relation Age of Onset  . Hypertension Other   . Diabetes Mother   . Diabetes Father     Social History Social History   Tobacco Use  . Smoking status: Current Every Day Smoker    Packs/day: 0.50    Types: Cigarettes  . Smokeless tobacco: Never Used  Substance Use Topics  . Alcohol use: No  . Drug use: No    Review of Systems  Constitutional: No fever/chills Eyes: No visual changes. ENT: No sore throat. Cardiovascular: Denies chest pain. Respiratory: Denies shortness of breath. Gastrointestinal: No abdominal pain.  No nausea, no vomiting.  No diarrhea.  No constipation. Genitourinary: Negative for dysuria. Musculoskeletal: Negative for back pain. Skin: Positive for "acne to groin area".  Negative for rash. Neurological: Negative for headaches, focal weakness or numbness.   ____________________________________________   PHYSICAL EXAM:  VITAL SIGNS: ED Triage Vitals  Enc Vitals Group     BP 09/02/20  2249 130/85     Pulse Rate 09/02/20 2249 98     Resp 09/02/20 2249 18     Temp 09/02/20 2249 98.4 F (36.9 C)     Temp Source 09/02/20 2249 Oral     SpO2 09/02/20 2249 97 %     Weight 09/02/20 2254 232 lb (105.2 kg)     Height 09/02/20 2254 5\' 11"  (1.803 m)     Head Circumference --      Peak Flow --      Pain Score 09/02/20 2254 10     Pain Loc --        Pain Edu? --      Excl. in [place]? --     Constitutional: Alert and oriented. Well appearing and in mild acute distress. Eyes: Conjunctivae are normal. PERRL. EOMI. Head: Atraumatic. Nose: No congestion/rhinnorhea. Mouth/Throat: Mucous membranes are moist.   Neck: No stridor.   Cardiovascular: Normal rate, regular rhythm. Grossly normal heart sounds.  Good peripheral circulation. Respiratory: Normal respiratory effort.  No retractions. Lungs CTAB. Gastrointestinal: Soft and nontender to D palpation. No distention. No abdominal bruits. No CVA tenderness. Genitourinary: Circumcised male.  Bilaterally descended testicles which are nontender nonswollen.  Strong bilateral cremasteric reflexes. Patient's homemade dressing of toilet paper removed: Skin changes of hidradenitis to mons pubis and groin, multiple areas of scar tissue, swelling, purplish discoloration, induration.  Skin changes do not extend to perineum or testicles.     Musculoskeletal: No lower extremity tenderness nor edema.  No joint effusions. Neurologic:  Normal speech and language. No gross focal neurologic deficits are appreciated. No gait instability. Skin:  Skin is warm, dry and intact. No rash noted. Psychiatric: Mood and affect are normal. Speech and behavior are normal.  ____________________________________________   LABS (all labs ordered are listed, but only abnormal results are displayed)  Labs Reviewed  COMPREHENSIVE METABOLIC PANEL - Abnormal; Notable for the following components:      Result Value   Glucose, Bld 125 (*)    Total Protein 8.8 (*)    Albumin 3.3 (*)    All other components within normal limits  CBC WITH DIFFERENTIAL/PLATELET - Abnormal; Notable for the following components:   MCV 100.6 (*)    All other components within normal limits  RESP PANEL BY RT-PCR (FLU A&B, COVID) ARPGX2  CULTURE, BLOOD (ROUTINE X 2)  CULTURE, BLOOD (ROUTINE X 2)  URINE CULTURE  LACTIC ACID, PLASMA  URINALYSIS,  COMPLETE (UACMP) WITH MICROSCOPIC  HIV ANTIBODY (ROUTINE TESTING W REFLEX)  BASIC METABOLIC PANEL  CBC   ____________________________________________  EKG  None ____________________________________________  RADIOLOGY I, Nija Koopman J, personally viewed and evaluated these images (plain radiographs) as part of my medical decision making, as well as reviewing the written report by the radiologist.  ED MD interpretation: CT consistent with cellulitis lower pelvic wall and groin  Official radiology report(s): CT Abdomen Pelvis W Contrast  Result Date: 09/03/2020 CLINICAL DATA:  Groin pain. EXAM: CT ABDOMEN AND PELVIS WITH CONTRAST TECHNIQUE: Multidetector CT imaging of the abdomen and pelvis was performed using the standard protocol following bolus administration of intravenous contrast. CONTRAST:  11mL OMNIPAQUE IOHEXOL 300 MG/ML  SOLN COMPARISON:  None. FINDINGS: Lower chest: No acute abnormality. Hepatobiliary: There is mild diffuse fatty infiltration of the liver parenchyma. No focal liver abnormality is seen. No gallstones, gallbladder wall thickening, or biliary dilatation. Pancreas: Unremarkable. No pancreatic ductal dilatation or surrounding inflammatory changes. Spleen: Normal in size without focal abnormality. Adrenals/Urinary Tract: The left adrenal  gland is normal in appearance. A 1.4 cm fat density right adrenal mass is present. Kidneys are normal, without renal calculi, focal lesion, or hydronephrosis. Bladder is unremarkable. Stomach/Bowel: Stomach is within normal limits. Appendix appears normal. No evidence of bowel wall thickening, distention, or inflammatory changes. Vascular/Lymphatic: No significant vascular findings are present. No enlarged abdominal or pelvic lymph nodes. Reproductive: Prostate is unremarkable. Other: A moderate amount of subcutaneous inflammatory fat stranding is seen along the anterior aspect of the lower pelvic wall, to the left of midline. This extends  inferiorly to the level of the groin. Diffuse anterior pelvic wall cutaneous thickening is also seen within this region with 1.4 cm x 1.6 cm, 2.3 cm x 0.9 cm and 2.6 cm x 0.8 cm subcutaneous nodular appearing areas of increased attenuation (axial CT images 81 through 100, CT series number 2). No abdominopelvic ascites. Musculoskeletal: No acute or significant osseous findings. IMPRESSION: 1. Moderate severity cellulitis along the anterior aspect of the lower pelvic wall and groin. 2. Associated subcutaneous nodular appearing areas which may represent small abscesses. 3. Small right adrenal myelolipoma. Electronically Signed   By: Virgina Norfolk M.D.   On: 09/03/2020 00:03    ____________________________________________   PROCEDURES  Procedure(s) performed (including Critical Care):  Procedures   ____________________________________________   INITIAL IMPRESSION / ASSESSMENT AND PLAN / ED COURSE  As part of my medical decision making, I reviewed the following data within the [place] notes reviewed and incorporated, Labs reviewed, Old chart reviewed (02/26/2020 surgery, 03/2020 office visit), Radiograph reviewed, Notes from prior ED visits and [place] Controlled Substance Database     44 year old male with history of hidradenitis status post excision of right perineum in May/2021 presenting with a 81-month history of groin pain and skin changes.  Finally able to have medical evaluation because he got insurance.  Differential diagnosis includes but is not limited to cellulitis, abscess, Fournier's gangrene, hidradenitis suppurativa, etc.  Laboratory results unremarkable including normal lactic acid.  Will obtain CT scanning to evaluate for Fournier's gangrene.  Obtain blood cultures and initiate broad-spectrum IV antibiotics.  Will reassess.   Clinical Course as of Sep 04 355  Sat Sep 03, 2020  0015 Updated patient on CT imaging results.  Will discuss with hospital  services for admission for broad-spectrum IV antibiotics.   [JS]    Clinical Course User Index [JS] Paulette Blanch, MD     ____________________________________________   FINAL CLINICAL IMPRESSION(S) / ED DIAGNOSES  Final diagnoses:  Cellulitis of groin  Hidradenitis     ED Discharge Orders    None      *Please note:  Curtis Peterson was evaluated in Emergency Department on 09/03/2020 for the symptoms described in the history of present illness. He was evaluated in the context of the global COVID-19 pandemic, which necessitated consideration that the patient might be at risk for infection with the SARS-CoV-2 virus that causes COVID-19. Institutional protocols and algorithms that pertain to the evaluation of patients at risk for COVID-19 are in a state of rapid change based on information released by regulatory bodies including the CDC and federal and state organizations. These policies and algorithms were followed during the patient's care in the ED.  Some ED evaluations and interventions may be delayed as a result of limited staffing during and the pandemic.*   Note:  This document was prepared using Dragon voice recognition software and may include unintentional dictation errors.   Paulette Blanch, MD 09/03/20 [phone]

## 2020-09-02 NOTE — ED Notes (Signed)
1 set of cultures sent to lab

## 2020-09-03 ENCOUNTER — Encounter: Payer: Self-pay | Admitting: Family Medicine

## 2020-09-03 DIAGNOSIS — Z23 Encounter for immunization: Secondary | ICD-10-CM | POA: Diagnosis not present

## 2020-09-03 DIAGNOSIS — L7631 Postprocedural hematoma of skin and subcutaneous tissue following a dermatologic procedure: Secondary | ICD-10-CM | POA: Diagnosis not present

## 2020-09-03 DIAGNOSIS — I10 Essential (primary) hypertension: Secondary | ICD-10-CM | POA: Diagnosis present

## 2020-09-03 DIAGNOSIS — L7682 Other postprocedural complications of skin and subcutaneous tissue: Secondary | ICD-10-CM | POA: Diagnosis not present

## 2020-09-03 DIAGNOSIS — L03314 Cellulitis of groin: Secondary | ICD-10-CM | POA: Diagnosis present

## 2020-09-03 DIAGNOSIS — Z6832 Body mass index (BMI) 32.0-32.9, adult: Secondary | ICD-10-CM | POA: Diagnosis not present

## 2020-09-03 DIAGNOSIS — E119 Type 2 diabetes mellitus without complications: Secondary | ICD-10-CM | POA: Diagnosis present

## 2020-09-03 DIAGNOSIS — L7621 Postprocedural hemorrhage and hematoma of skin and subcutaneous tissue following a dermatologic procedure: Secondary | ICD-10-CM | POA: Diagnosis not present

## 2020-09-03 DIAGNOSIS — Y838 Other surgical procedures as the cause of abnormal reaction of the patient, or of later complication, without mention of misadventure at the time of the procedure: Secondary | ICD-10-CM | POA: Diagnosis not present

## 2020-09-03 DIAGNOSIS — E669 Obesity, unspecified: Secondary | ICD-10-CM | POA: Diagnosis present

## 2020-09-03 DIAGNOSIS — L732 Hidradenitis suppurativa: Secondary | ICD-10-CM | POA: Diagnosis present

## 2020-09-03 DIAGNOSIS — Z79899 Other long term (current) drug therapy: Secondary | ICD-10-CM | POA: Diagnosis not present

## 2020-09-03 DIAGNOSIS — D7589 Other specified diseases of blood and blood-forming organs: Secondary | ICD-10-CM | POA: Diagnosis present

## 2020-09-03 DIAGNOSIS — Z8249 Family history of ischemic heart disease and other diseases of the circulatory system: Secondary | ICD-10-CM | POA: Diagnosis not present

## 2020-09-03 DIAGNOSIS — K6819 Other retroperitoneal abscess: Secondary | ICD-10-CM | POA: Diagnosis not present

## 2020-09-03 DIAGNOSIS — Z833 Family history of diabetes mellitus: Secondary | ICD-10-CM | POA: Diagnosis not present

## 2020-09-03 DIAGNOSIS — F17210 Nicotine dependence, cigarettes, uncomplicated: Secondary | ICD-10-CM | POA: Diagnosis present

## 2020-09-03 DIAGNOSIS — A4901 Methicillin susceptible Staphylococcus aureus infection, unspecified site: Secondary | ICD-10-CM | POA: Diagnosis not present

## 2020-09-03 DIAGNOSIS — D1779 Benign lipomatous neoplasm of other sites: Secondary | ICD-10-CM | POA: Diagnosis present

## 2020-09-03 DIAGNOSIS — Z20822 Contact with and (suspected) exposure to covid-19: Secondary | ICD-10-CM | POA: Diagnosis present

## 2020-09-03 DIAGNOSIS — L7622 Postprocedural hemorrhage and hematoma of skin and subcutaneous tissue following other procedure: Secondary | ICD-10-CM | POA: Diagnosis not present

## 2020-09-03 DIAGNOSIS — T8141XA Infection following a procedure, superficial incisional surgical site, initial encounter: Secondary | ICD-10-CM | POA: Diagnosis not present

## 2020-09-03 DIAGNOSIS — B954 Other streptococcus as the cause of diseases classified elsewhere: Secondary | ICD-10-CM | POA: Diagnosis present

## 2020-09-03 DIAGNOSIS — L03311 Cellulitis of abdominal wall: Secondary | ICD-10-CM | POA: Diagnosis present

## 2020-09-03 LAB — CBC
HCT: 46.5 % (ref 39.0–52.0)
Hemoglobin: 15.3 g/dL (ref 13.0–17.0)
MCH: 33.1 pg (ref 26.0–34.0)
MCHC: 32.9 g/dL (ref 30.0–36.0)
MCV: 100.6 fL — ABNORMAL HIGH (ref 80.0–100.0)
Platelets: 255 10*3/uL (ref 150–400)
RBC: 4.62 MIL/uL (ref 4.22–5.81)
RDW: 13.3 % (ref 11.5–15.5)
WBC: 8.4 10*3/uL (ref 4.0–10.5)
nRBC: 0.4 % — ABNORMAL HIGH (ref 0.0–0.2)

## 2020-09-03 LAB — BASIC METABOLIC PANEL
Anion gap: 7 (ref 5–15)
BUN: 9 mg/dL (ref 6–20)
CO2: 23 mmol/L (ref 22–32)
Calcium: 8.9 mg/dL (ref 8.9–10.3)
Chloride: 106 mmol/L (ref 98–111)
Creatinine, Ser: 0.64 mg/dL (ref 0.61–1.24)
GFR, Estimated: >60 mL/min (ref >60)
Glucose, Bld: 110 mg/dL — ABNORMAL HIGH (ref 70–99)
Potassium: 4 mmol/L (ref 3.5–5.1)
Sodium: 136 mmol/L (ref 135–145)

## 2020-09-03 LAB — RESP PANEL BY RT-PCR (FLU A&B, COVID) ARPGX2
Influenza A by PCR: NEGATIVE
Influenza B by PCR: NEGATIVE
SARS Coronavirus 2 by RT PCR: NEGATIVE

## 2020-09-03 LAB — URINALYSIS, COMPLETE (UACMP) WITH MICROSCOPIC
Bacteria, UA: NONE SEEN
Bilirubin Urine: NEGATIVE
Glucose, UA: NEGATIVE mg/dL
Ketones, ur: NEGATIVE mg/dL
Leukocytes,Ua: NEGATIVE
Nitrite: NEGATIVE
Protein, ur: 30 mg/dL — AB
Specific Gravity, Urine: 1.031 — ABNORMAL HIGH (ref 1.005–1.030)
Squamous Epithelial / HPF: NONE SEEN (ref 0–5)
WBC, UA: NONE SEEN WBC/hpf (ref 0–5)
pH: 5 (ref 5.0–8.0)

## 2020-09-03 LAB — GLUCOSE, CAPILLARY: Glucose-Capillary: 86 mg/dL (ref 70–99)

## 2020-09-03 LAB — CBG MONITORING, ED
Glucose-Capillary: 86 mg/dL (ref 70–99)
Glucose-Capillary: 135 mg/dL — ABNORMAL HIGH (ref 70–99)

## 2020-09-03 LAB — HIV ANTIBODY (ROUTINE TESTING W REFLEX): HIV Screen 4th Generation wRfx: NONREACTIVE

## 2020-09-03 MED ORDER — ONDANSETRON HCL 4 MG PO TABS: 4.0000 mg | ORAL_TABLET | Freq: Four times a day (QID) | ORAL | Status: DC | PRN

## 2020-09-03 MED ORDER — INSULIN ASPART 100 UNIT/ML ~~LOC~~ SOLN: 0.0000 [IU] | Freq: Every day | SUBCUTANEOUS | Status: DC

## 2020-09-03 MED ORDER — INSULIN ASPART 100 UNIT/ML ~~LOC~~ SOLN
0.0000 [IU] | Freq: Three times a day (TID) | SUBCUTANEOUS | Status: DC
  Administered 2020-09-03: 2 [IU] via SUBCUTANEOUS
  Administered 2020-09-04: 3 [IU] via SUBCUTANEOUS
  Administered 2020-09-04: 2 [IU] via SUBCUTANEOUS
  Filled 2020-09-03 (×3): qty 1

## 2020-09-03 MED ORDER — LISINOPRIL 2.5 MG PO TABS
2.5000 mg | ORAL_TABLET | Freq: Every day | ORAL | Status: DC
  Administered 2020-09-04 – 2020-09-07 (×4): 2.5 mg via ORAL
  Filled 2020-09-03 (×5): qty 1

## 2020-09-03 MED ORDER — SODIUM CHLORIDE 0.9 % IV SOLN
1.0000 g | Freq: Three times a day (TID) | INTRAVENOUS | Status: DC
  Administered 2020-09-03 – 2020-09-05 (×7): 1 g via INTRAVENOUS
  Filled 2020-09-03 (×9): qty 1

## 2020-09-03 MED ORDER — MORPHINE SULFATE (PF) 2 MG/ML IV SOLN
2.0000 mg | INTRAVENOUS | Status: DC | PRN
  Administered 2020-09-03 – 2020-09-04 (×2): 2 mg via INTRAVENOUS
  Filled 2020-09-03 (×2): qty 1

## 2020-09-03 MED ORDER — VANCOMYCIN HCL IN DEXTROSE 1-5 GM/200ML-% IV SOLN
1000.0000 mg | Freq: Three times a day (TID) | INTRAVENOUS | Status: DC
  Administered 2020-09-03 – 2020-09-05 (×7): 1000 mg via INTRAVENOUS
  Filled 2020-09-03 (×9): qty 200

## 2020-09-03 MED ORDER — SODIUM CHLORIDE 0.9 % IV SOLN: INTRAVENOUS | Status: DC

## 2020-09-03 MED ORDER — VANCOMYCIN HCL IN DEXTROSE 1-5 GM/200ML-% IV SOLN: 1000.0000 mg | Freq: Once | INTRAVENOUS | Status: DC

## 2020-09-03 MED ORDER — ACETAMINOPHEN 325 MG PO TABS: 650.0000 mg | ORAL_TABLET | Freq: Four times a day (QID) | ORAL | Status: DC | PRN

## 2020-09-03 MED ORDER — ACETAMINOPHEN 650 MG RE SUPP: 650.0000 mg | Freq: Four times a day (QID) | RECTAL | Status: DC | PRN

## 2020-09-03 MED ORDER — TRAZODONE HCL 50 MG PO TABS
25.0000 mg | ORAL_TABLET | Freq: Every evening | ORAL | Status: DC | PRN
  Administered 2020-09-03 – 2020-09-05 (×3): 25 mg via ORAL
  Filled 2020-09-03 (×3): qty 1

## 2020-09-03 MED ORDER — ENOXAPARIN SODIUM 40 MG/0.4ML ~~LOC~~ SOLN
40.0000 mg | SUBCUTANEOUS | Status: DC
  Administered 2020-09-03 – 2020-09-04 (×2): 40 mg via SUBCUTANEOUS
  Filled 2020-09-03 (×2): qty 0.4

## 2020-09-03 MED ORDER — ONDANSETRON HCL 4 MG/2ML IJ SOLN: 4.0000 mg | Freq: Four times a day (QID) | INTRAMUSCULAR | Status: DC | PRN

## 2020-09-03 MED ORDER — SODIUM CHLORIDE 0.9 % IV SOLN
1.0000 g | Freq: Once | INTRAVENOUS | Status: AC
  Administered 2020-09-03: 1 g via INTRAVENOUS
  Filled 2020-09-03: qty 1

## 2020-09-03 MED ORDER — MAGNESIUM HYDROXIDE 400 MG/5ML PO SUSP: 30.0000 mL | Freq: Every day | ORAL | Status: DC | PRN

## 2020-09-03 MED ORDER — HYDROCODONE-ACETAMINOPHEN 5-325 MG PO TABS
1.0000 | ORAL_TABLET | ORAL | Status: DC | PRN
  Administered 2020-09-03 (×2): 2 via ORAL
  Administered 2020-09-03: 1 via ORAL
  Administered 2020-09-04: 2 via ORAL
  Filled 2020-09-03: qty 1
  Filled 2020-09-03 (×3): qty 2

## 2020-09-03 NOTE — Consult Note (Signed)
Date of Consultation:  09/03/2020  Requesting Physician:  Eugenie Norrie, MD  Reason for Consultation:  Hidradenitis abscesses  History of Present Illness: Curtis Peterson is a 44 y.o. male presenting with a 1 to 3-month history of progressing skin abscesses in the suprapubic region.  Patient has a history of hidradenitis and has had prior incision and drainage procedures as well as excisions with Dr. Dahlia Byes in the past.  Patient reports that he didn't have insurance and that is why he didn't seek any help earlier.  However he recently got health insurance through a new job and started trying to get appointments to be seen.  However he reports that he feels that he was a little too late.  The area in the suprapubic region has been having worsening pain with drainage.  But denies any fevers or chills, chest pain, shortness of breath, nausea, vomiting.  His main complaint is of pain and drainage.  He presented to the emergency room overnight and initial laboratory work-up shows a white blood cell count of 9.2, with a hemoglobin of 16.7.  His creatinine is normal 0.66 and glucose is 125.  He had a CT scan of the abdomen pelvis which shows multiple areas of cellulitis and possible abscesses in the suprapubic region.  I have personally reviewed the images and agree with the findings.  Past Medical History: Past Medical History:  Diagnosis Date  . Diabetes mellitus without complication ([place])   . Hypertension      Past Surgical History: Past Surgical History:  Procedure Laterality Date  . head lesions removed  2015  . HYDRADENITIS EXCISION N/A 02/26/2020   Procedure: EXCISION HIDRADENITIS, Perineum;  Surgeon: Jules Husbands, MD;  Location: [facility] ORS;  Service: General;  Laterality: N/A;  . INCISION AND DRAINAGE PERIRECTAL ABSCESS N/A 06/30/2019   Procedure: IRRIGATION AND DEBRIDEMENT PERIRECTAL ABSCESS;  Surgeon: Jules Husbands, MD;  Location: [facility] ORS;  Service: General;  Laterality: N/A;    Home  Medications: Prior to Admission medications   Medication Sig Start Date End Date Taking? Authorizing Provider  acetaminophen (TYLENOL) 325 MG tablet Take 650 mg by mouth every 6 (six) hours as needed for moderate pain.   Yes [provider]  lisinopril (ZESTRIL) 2.5 MG tablet Take 2.5 mg by mouth daily.    Yes [provider]  valACYclovir (VALTREX) 500 MG tablet Take 500 mg by mouth daily. 08/27/20  Yes [provider]    Allergies: No Known Allergies  Social History:  reports that he has been smoking cigarettes. He has been smoking about 0.50 packs per day. He has never used smokeless tobacco. He reports that he does not drink alcohol and does not use drugs.   Family History: Family History  Problem Relation Age of Onset  . Hypertension Other   . Diabetes Mother   . Diabetes Father     Review of Systems: Review of Systems  Constitutional: Negative for chills and fever.  HENT: Negative for hearing loss.   Respiratory: Negative for shortness of breath.   Cardiovascular: Negative for chest pain.  Gastrointestinal: Negative for abdominal pain, nausea and vomiting.  Genitourinary: Negative for dysuria.  Musculoskeletal: Negative for myalgias.  Skin:       Suprapubic wounds with drainage  Neurological: Negative for dizziness.  Psychiatric/Behavioral: Negative for depression.    Physical Exam BP 102/75   Pulse 70   Temp 98.4 F (36.9 C) (Oral)   Resp 19   Ht 5\' 11"  (1.803 m)  Wt 105.2 kg   SpO2 96%   BMI 32.36 kg/m  CONSTITUTIONAL: No acute distress HEENT:  Normocephalic, atraumatic, extraocular motion intact. NECK: Trachea is midline, and there is no jugular venous distension. RESPIRATORY:  Normal respiratory effort without pathologic use of accessory muscles. CARDIOVASCULAR: Regular rhythm and rate. GI: The abdomen is soft, obese, nondistended, with tenderness to palpation in the suprapubic region but no intra-abdominal pain.   MUSCULOSKELETAL:  Normal muscle strength and tone in all four extremities.  No peripheral edema or cyanosis. SKIN: The skin over the suprapubic region contains multiple areas of fluctuance consistent with abscesses likely from hidradenitis.  There are 2 areas that are draining purulent fluid with manual pressure.  These areas are tender to palpation.  There is surrounding erythema or induration. NEUROLOGIC:  Motor and sensation is grossly normal.  Cranial nerves are grossly intact. PSYCH:  Alert and oriented to person, place and time. Affect is normal.  Laboratory Analysis: Results for orders placed or performed during the hospital encounter of 09/02/20 (from the past 24 hour(s))  Lactic acid, plasma     Status: None   Collection Time: 09/02/20 11:00 PM  Result Value Ref Range   Lactic Acid, Venous 1.7 0.5 - 1.9 mmol/L  Comprehensive metabolic panel     Status: Abnormal   Collection Time: 09/02/20 11:00 PM  Result Value Ref Range   Sodium 136 135 - 145 mmol/L   Potassium 4.0 3.5 - 5.1 mmol/L   Chloride 103 98 - 111 mmol/L   CO2 23 22 - 32 mmol/L   Glucose, Bld 125 (H) 70 - 99 mg/dL   BUN 11 6 - 20 mg/dL   Creatinine, Ser 0.66 0.61 - 1.24 mg/dL   Calcium 9.5 8.9 - 10.3 mg/dL   Total Protein 8.8 (H) 6.5 - 8.1 g/dL   Albumin 3.3 (L) 3.5 - 5.0 g/dL   AST 37 15 - 41 U/L   ALT 42 0 - 44 U/L   Alkaline Phosphatase 83 38 - 126 U/L   Total Bilirubin 0.5 0.3 - 1.2 mg/dL   GFR, Estimated >60 >60 mL/min   Anion gap 10 5 - 15  CBC with Differential     Status: Abnormal   Collection Time: 09/02/20 11:00 PM  Result Value Ref Range   WBC 9.2 4.0 - 10.5 K/uL   RBC 5.04 4.22 - 5.81 MIL/uL   Hemoglobin 16.7 13.0 - 17.0 g/dL   HCT 50.7 39 - 52 %   MCV 100.6 (H) 80.0 - 100.0 fL   MCH 33.1 26.0 - 34.0 pg   MCHC 32.9 30.0 - 36.0 g/dL   RDW 13.2 11.5 - 15.5 %   Platelets 296 150 - 400 K/uL   nRBC 0.0 0.0 - 0.2 %   Neutrophils Relative % 59 %   Neutro Abs 5.4 1.7 - 7.7 K/uL   Lymphocytes  Relative 32 %   Lymphs Abs 2.9 0.7 - 4.0 K/uL   Monocytes Relative 6 %   Monocytes Absolute 0.5 0.1 - 1.0 K/uL   Eosinophils Relative 2 %   Eosinophils Absolute 0.2 0.0 - 0.5 K/uL   Basophils Relative 1 %   Basophils Absolute 0.1 0.0 - 0.1 K/uL   Immature Granulocytes 0 %   Abs Immature Granulocytes 0.02 0.00 - 0.07 K/uL  Culture, blood (routine x 2)     Status: None (Preliminary result)   Collection Time: 09/02/20 11:00 PM   Specimen: BLOOD  Result Value Ref Range   Specimen Description  BLOOD BLOOD RIGHT FOREARM    Special Requests      BOTTLES DRAWN AEROBIC AND ANAEROBIC Blood Culture adequate volume   Culture      NO GROWTH < 12 HOURS Performed at [facility], [address] [place], [place] [address]    Report Status PENDING   Urinalysis, Complete w Microscopic     Status: Abnormal   Collection Time: 09/03/20 12:05 AM  Result Value Ref Range   Color, Urine YELLOW (A) YELLOW   APPearance CLEAR (A) CLEAR   Specific Gravity, Urine 1.031 (H) 1.005 - 1.030   pH 5.0 5.0 - 8.0   Glucose, UA NEGATIVE NEGATIVE mg/dL   Hgb urine dipstick SMALL (A) NEGATIVE   Bilirubin Urine NEGATIVE NEGATIVE   Ketones, ur NEGATIVE NEGATIVE mg/dL   Protein, ur 30 (A) NEGATIVE mg/dL   Nitrite NEGATIVE NEGATIVE   Leukocytes,Ua NEGATIVE NEGATIVE   WBC, UA NONE SEEN 0 - 5 WBC/hpf   Bacteria, UA NONE SEEN NONE SEEN   Squamous Epithelial / LPF NONE SEEN 0 - 5   Mucus PRESENT   Resp Panel by RT-PCR (Flu A&B, Covid) Nasopharyngeal Swab     Status: None   Collection Time: 09/03/20 12:05 AM   Specimen: Nasopharyngeal Swab; Nasopharyngeal(NP) swabs in vial transport medium  Result Value Ref Range   SARS Coronavirus 2 by RT PCR NEGATIVE NEGATIVE   Influenza A by PCR NEGATIVE NEGATIVE   Influenza B by PCR NEGATIVE NEGATIVE  Culture, blood (routine x 2)     Status: None (Preliminary result)   Collection Time: 09/03/20 12:05 AM   Specimen: BLOOD  Result Value Ref Range   Specimen Description  BLOOD LEFT ANTECUBITAL    Special Requests      BOTTLES DRAWN AEROBIC AND ANAEROBIC Blood Culture adequate volume   Culture      NO GROWTH < 12 HOURS Performed at [facility], [address] [place],  [address]    Report Status PENDING   Basic metabolic panel     Status: Abnormal   Collection Time: 09/03/20  5:33 AM  Result Value Ref Range   Sodium 136 135 - 145 mmol/L   Potassium 4.0 3.5 - 5.1 mmol/L   Chloride 106 98 - 111 mmol/L   CO2 23 22 - 32 mmol/L   Glucose, Bld 110 (H) 70 - 99 mg/dL   BUN 9 6 - 20 mg/dL   Creatinine, Ser 0.64 0.61 - 1.24 mg/dL   Calcium 8.9 8.9 - 10.3 mg/dL   GFR, Estimated >60 >60 mL/min   Anion gap 7 5 - 15  CBC     Status: Abnormal   Collection Time: 09/03/20  5:33 AM  Result Value Ref Range   WBC 8.4 4.0 - 10.5 K/uL   RBC 4.62 4.22 - 5.81 MIL/uL   Hemoglobin 15.3 13.0 - 17.0 g/dL   HCT 46.5 39 - 52 %   MCV 100.6 (H) 80.0 - 100.0 fL   MCH 33.1 26.0 - 34.0 pg   MCHC 32.9 30.0 - 36.0 g/dL   RDW 13.3 11.5 - 15.5 %   Platelets 255 150 - 400 K/uL   nRBC 0.4 (H) 0.0 - 0.2 %    Imaging: CT Abdomen Pelvis W Contrast  Result Date: 09/03/2020 CLINICAL DATA:  Groin pain. EXAM: CT ABDOMEN AND PELVIS WITH CONTRAST TECHNIQUE: Multidetector CT imaging of the abdomen and pelvis was performed using the standard protocol following bolus administration of intravenous contrast. CONTRAST:  115mL OMNIPAQUE  IOHEXOL 300 MG/ML  SOLN COMPARISON:  None. FINDINGS: Lower chest: No acute abnormality. Hepatobiliary: There is mild diffuse fatty infiltration of the liver parenchyma. No focal liver abnormality is seen. No gallstones, gallbladder wall thickening, or biliary dilatation. Pancreas: Unremarkable. No pancreatic ductal dilatation or surrounding inflammatory changes. Spleen: Normal in size without focal abnormality. Adrenals/Urinary Tract: The left adrenal gland is normal in appearance. A 1.4 cm fat density right adrenal mass is present. Kidneys are  normal, without renal calculi, focal lesion, or hydronephrosis. Bladder is unremarkable. Stomach/Bowel: Stomach is within normal limits. Appendix appears normal. No evidence of bowel wall thickening, distention, or inflammatory changes. Vascular/Lymphatic: No significant vascular findings are present. No enlarged abdominal or pelvic lymph nodes. Reproductive: Prostate is unremarkable. Other: A moderate amount of subcutaneous inflammatory fat stranding is seen along the anterior aspect of the lower pelvic wall, to the left of midline. This extends inferiorly to the level of the groin. Diffuse anterior pelvic wall cutaneous thickening is also seen within this region with 1.4 cm x 1.6 cm, 2.3 cm x 0.9 cm and 2.6 cm x 0.8 cm subcutaneous nodular appearing areas of increased attenuation (axial CT images 81 through 100, CT series number 2). No abdominopelvic ascites. Musculoskeletal: No acute or significant osseous findings. IMPRESSION: 1. Moderate severity cellulitis along the anterior aspect of the lower pelvic wall and groin. 2. Associated subcutaneous nodular appearing areas which may represent small abscesses. 3. Small right adrenal myelolipoma. Electronically Signed   By: Virgina Norfolk M.D.   On: 09/03/2020 00:03    Assessment and Plan: This is a 44 y.o. male with suprapubic abscesses consistent with hidradenitis.  -Discussed with the patient that given the multiple abscesses it be best to take him to the operating room for incision and drainage procedures of all these areas.  Discussed with patient that we will do this tomorrow so he can be n.p.o. after midnight.  In the meantime continue IV fluids and IV antibiotics and diet for today but n.p.o. after midnight.  Discussed with him the risks and benefits of the procedure including risk of bleeding, infection, injury to surrounding structures, need for further procedures, and he is willing to proceed.  He'll be scheduled for tomorrow  morning.  Face-to-face time spent with the patient and care providers was 55 minutes, with more than 50% of the time spent counseling, educating, and coordinating care of the patient.     Melvyn Neth, MD [facility]:  [phone]

## 2020-09-03 NOTE — H&P (Signed)
[place]   PATIENT NAME: Farah Benish    MR#:  [id number]  DATE OF BIRTH:  1976-08-26  DATE OF ADMISSION:  09/02/2020  PRIMARY CARE PHYSICIAN: Tracie Harrier, MD   REQUESTING/REFERRING PHYSICIAN: Lurline Hare, MD  CHIEF COMPLAINT:   Chief Complaint  Patient presents with  . Groin Pain    HISTORY OF PRESENT ILLNESS:  Oluwadamilare Tobler  is a 44 y.o. male with a known history of diabetes mellitus, hypertension hidradenitis suppurativa, who presented to the emergency room with acute onset of worsening lower abdominal wall and groin swelling with associated erythema, warmth and tenderness and some groin drainage.  He denies any fever or chills.  No nausea or vomiting.  He denied dysuria, oliguria or hematuria or flank pain.  No chest pain or dyspnea or cough.  He has not taken any outpatient antibiotics.  He has a history of groin and perineal hidradenitis suppurativa status post surgical excision.  Upon presentation to the emergency room, vital signs were within normal.  Labs revealed unremarkable CMP and CBC showed macrocytosis.  Respiratory panel is currently pending.  Blood cultures were drawn.  Abdominal pelvic CT scan revealed the following: 1. Moderate severity cellulitis along the anterior aspect of the lower pelvic wall and groin. 2. Associated subcutaneous nodular appearing areas which may represent small abscesses. 3. Small right adrenal myelolipoma.  The patient was ordered IV vancomycin and Zosyn, 4 mg of IV morphine sulfate and 4 mg of IV Zofran as well as 1 L bolus of IV normal saline.  He will be admitted to medical bed for further evaluation and management.  PAST MEDICAL HISTORY:   Past Medical History:  Diagnosis Date  . Diabetes mellitus without complication ([place])   . Hypertension   Hidradenitis suppurativa.  PAST SURGICAL HISTORY:   Past Surgical History:  Procedure Laterality Date  . head lesions removed  2015  . HYDRADENITIS EXCISION N/A  02/26/2020   Procedure: EXCISION HIDRADENITIS, Perineum;  Surgeon: Jules Husbands, MD;  Location: [facility] ORS;  Service: General;  Laterality: N/A;  . INCISION AND DRAINAGE PERIRECTAL ABSCESS N/A 06/30/2019   Procedure: IRRIGATION AND DEBRIDEMENT PERIRECTAL ABSCESS;  Surgeon: Jules Husbands, MD;  Location: [facility] ORS;  Service: General;  Laterality: N/A;    SOCIAL HISTORY:   Social History   Tobacco Use  . Smoking status: Current Every Day Smoker    Packs/day: 0.50    Types: Cigarettes  . Smokeless tobacco: Never Used  Substance Use Topics  . Alcohol use: No    FAMILY HISTORY:   Family History  Problem Relation Age of Onset  . Hypertension Other   . Diabetes Mother   . Diabetes Father     DRUG ALLERGIES:  No Known Allergies  REVIEW OF SYSTEMS:   ROS As per history of present illness. All pertinent systems were reviewed above. Constitutional, HEENT, cardiovascular, respiratory, GI, GU, musculoskeletal, neuro, psychiatric, endocrine, integumentary and hematologic systems were reviewed and are otherwise negative/unremarkable except for positive findings mentioned above in the HPI.   MEDICATIONS AT HOME:   Prior to Admission medications   Medication Sig Start Date End Date Taking? Authorizing Provider  HYDROcodone-acetaminophen (NORCO/VICODIN) 5-325 MG tablet Take 1-2 tablets by mouth every 4 (four) hours as needed for moderate pain. Patient not taking: Reported on 04/04/2020 03/14/20   Jules Husbands, MD  ISOtretinoin (ACCUTANE) 20 MG capsule Take one 20 mg tab po QD with a fatty meal. D/C Doxycycline. Drema Halon #[phone] Patient not  taking: Reported on 04/04/2020 12/21/19   Ralene Bathe, MD  ISOtretinoin (ACCUTANE) 30 MG capsule Take one cap po QD with food 01/27/20   Ralene Bathe, MD  lisinopril (ZESTRIL) 2.5 MG tablet Take 2.5 mg by mouth daily.     [provider]      VITAL SIGNS:  Blood pressure 107/78, pulse 95, temperature 98.4 F (36.9 C), temperature  source Oral, resp. rate (!) 25, height 5\' 11"  (1.803 m), weight 105.2 kg, SpO2 97 %.  PHYSICAL EXAMINATION:  Physical Exam  GENERAL:  44 y.o.-year-old patient lying in the bed with no acute distress.  EYES: Pupils equal, round, reactive to light and accommodation. No scleral icterus. Extraocular muscles intact.  HEENT: Head atraumatic, normocephalic. Oropharynx and nasopharynx clear.  NECK:  Supple, no jugular venous distention. No thyroid enlargement, no tenderness.  LUNGS: Normal breath sounds bilaterally, no wheezing, rales,rhonchi or crepitation. No use of accessory muscles of respiration.  CARDIOVASCULAR: Regular rate and rhythm, S1, S2 normal. No murmurs, rubs, or gallops.  ABDOMEN/skin: Soft, nondistended, with lower abdominal and groin tenderness with associated erythema, warmth induration without clear fluctuation.  Bowel sounds present. No organomegaly or mass.  EXTREMITIES: No pedal edema, cyanosis, or clubbing.  NEUROLOGIC: Cranial nerves II through XII are intact. Muscle strength 5/5 in all extremities. Sensation intact. Gait not checked.  PSYCHIATRIC: The patient is alert and oriented x 3.  Normal affect and good eye contact. SKIN: As above.  He has scattered nodular lower abdominal and groin erythematous eruption with groin honey crusted drainage.    LABORATORY PANEL:   CBC Recent Labs  Lab 09/02/20 2300  WBC 9.2  HGB 16.7  HCT 50.7  PLT 296   ------------------------------------------------------------------------------------------------------------------  Chemistries  Recent Labs  Lab 09/02/20 2300  NA 136  K 4.0  CL 103  CO2 23  GLUCOSE 125*  BUN 11  CREATININE 0.66  CALCIUM 9.5  AST 37  ALT 42  ALKPHOS 83  BILITOT 0.5   ------------------------------------------------------------------------------------------------------------------  Cardiac Enzymes No results for input(s): TROPONINI in the last 168  hours. ------------------------------------------------------------------------------------------------------------------  RADIOLOGY:  CT Abdomen Pelvis W Contrast  Result Date: 09/03/2020 CLINICAL DATA:  Groin pain. EXAM: CT ABDOMEN AND PELVIS WITH CONTRAST TECHNIQUE: Multidetector CT imaging of the abdomen and pelvis was performed using the standard protocol following bolus administration of intravenous contrast. CONTRAST:  135mL OMNIPAQUE IOHEXOL 300 MG/ML  SOLN COMPARISON:  None. FINDINGS: Lower chest: No acute abnormality. Hepatobiliary: There is mild diffuse fatty infiltration of the liver parenchyma. No focal liver abnormality is seen. No gallstones, gallbladder wall thickening, or biliary dilatation. Pancreas: Unremarkable. No pancreatic ductal dilatation or surrounding inflammatory changes. Spleen: Normal in size without focal abnormality. Adrenals/Urinary Tract: The left adrenal gland is normal in appearance. A 1.4 cm fat density right adrenal mass is present. Kidneys are normal, without renal calculi, focal lesion, or hydronephrosis. Bladder is unremarkable. Stomach/Bowel: Stomach is within normal limits. Appendix appears normal. No evidence of bowel wall thickening, distention, or inflammatory changes. Vascular/Lymphatic: No significant vascular findings are present. No enlarged abdominal or pelvic lymph nodes. Reproductive: Prostate is unremarkable. Other: A moderate amount of subcutaneous inflammatory fat stranding is seen along the anterior aspect of the lower pelvic wall, to the left of midline. This extends inferiorly to the level of the groin. Diffuse anterior pelvic wall cutaneous thickening is also seen within this region with 1.4 cm x 1.6 cm, 2.3 cm x 0.9 cm and 2.6 cm x 0.8 cm subcutaneous nodular appearing  areas of increased attenuation (axial CT images 81 through 100, CT series number 2). No abdominopelvic ascites. Musculoskeletal: No acute or significant osseous findings. IMPRESSION:  1. Moderate severity cellulitis along the anterior aspect of the lower pelvic wall and groin. 2. Associated subcutaneous nodular appearing areas which may represent small abscesses. 3. Small right adrenal myelolipoma. Electronically Signed   By: Virgina Norfolk M.D.   On: 09/03/2020 00:03      IMPRESSION AND PLAN:   1.  Abdominal wall and groin severe nonpurulent cellulitis in the setting of immunosuppression with type 2 diabetes mellitus and hidradenitis with possible small abscesses. -The patient will be admitted to a medical bed. -We will continue antibiotic therapy with IV vancomycin and meropenem. -She will be hydrated with IV normal saline. -Pain management will be provided. -Warm compresses will be applied. -General surgery consult will be obtained. -I notified Dr. Hampton Abbot about the patient.  2.  Essential hypertension. -We will continue Zestril.  3.  Type 2 diabetes mellitus, apparently well controlled and diet managed. -The patient will be placed on supplement coverage with NovoLog.  4.  DVT prophylaxis. -Subcutaneous Lovenox.   All the records are reviewed and case discussed with ED provider. The plan of care was discussed in details with the patient (and family). I answered all questions. The patient agreed to proceed with the above mentioned plan. Further management will depend upon hospital course.   CODE STATUS: Full code  Status is: Inpatient  Remains inpatient appropriate because:Ongoing active pain requiring inpatient pain management, Ongoing diagnostic testing needed not appropriate for outpatient work up, Unsafe d/c plan, IV treatments appropriate due to intensity of illness or inability to take PO and Inpatient level of care appropriate due to severity of illness   Dispo: The patient is from: Home              Anticipated d/c is to: Home              Anticipated d/c date is: 2 days              Patient currently is not medically stable to d/c.   TOTAL  TIME TAKING CARE OF THIS PATIENT: 50 minutes.    Christel Mormon M.D on 09/03/2020 at 12:25 AM  [facility]   From 7 PM-7 AM, contact night-coverage [email or web address]  CC: Primary care physician; Tracie Harrier, MD

## 2020-09-03 NOTE — Progress Notes (Signed)
Pharmacy Antibiotic Note  Curtis Peterson is a 44 y.o. male admitted on 09/02/2020 with cellulitis.  Pharmacy has been consulted for Vancomycin and Meropenem dosing.  Plan: Vancomycin 1gm IV q8hrs (per nomogram) Meropenem 1gm IV q8hrs  Height: 5\' 11"  (180.3 cm) Weight: 105.2 kg (232 lb) IBW/kg (Calculated) : 75.3  Temp (24hrs), Avg:98.4 F (36.9 C), Min:98.4 F (36.9 C), Max:98.4 F (36.9 C)  Recent Labs  Lab 09/02/20 2300  WBC 9.2  CREATININE 0.66  LATICACIDVEN 1.7    Estimated Creatinine Clearance: 147 mL/min (by C-G formula based on SCr of 0.66 mg/dL).    No Known Allergies  Antimicrobials this admission:   >>    >>   Dose adjustments this admission:   Microbiology results:  BCx:   UCx:    Sputum:    MRSA PCR:   Thank you for allowing pharmacy to be a part of this patient's care.  Hart Robinsons A 09/03/2020 1:48 AM

## 2020-09-03 NOTE — Progress Notes (Signed)
PROGRESS NOTE    Curtis Peterson  TFT:[id number] DOB: July 01, 1976 DOA: 09/02/2020 PCP: Tracie Harrier, MD   Brief Narrative:  Curtis Peterson  is a 44 y.o. male with a known history of diabetes mellitus, hypertension hidradenitis suppurativa, who presented to the emergency room with acute onset of worsening lower abdominal wall and groin swelling with associated erythema, warmth and tenderness and some groin drainage.  He denies any fever or chills.  No nausea or vomiting.  He denied dysuria, oliguria or hematuria or flank pain.  No chest pain or dyspnea or cough.  He has not taken any outpatient antibiotics.  He has a history of groin and perineal hidradenitis suppurativa status post surgical excision.  Upon presentation to the emergency room, vital signs were within normal.  Labs revealed unremarkable CMP and CBC showed macrocytosis.  Respiratory panel is currently pending.  Blood cultures were drawn.  Abdominal pelvic CT scan revealed the following: 1. Moderate severity cellulitis along the anterior aspect of the lower pelvic wall and groin. 2. Associated subcutaneous nodular appearing areas which may represent small abscesses. 3. Small right adrenal myelolipoma.  The patient was ordered IV vancomycin and Zosyn, 4 mg of IV morphine sulfate and 4 mg of IV Zofran as well as 1 L bolus of IV normal saline.  He will be admitted to medical bed for further evaluation and management.   Assessment & Plan:   Active Problems:   Abdominal wall cellulitis   1.  Abdominal wall and groin severe purulent cellulitis in the setting of immunosuppression with type 2 diabetes mellitus and hidradenitis with possible small abscesses: General surgery on board.  To be seen yet.  Continue broad-spectrum antibiotics for now.  Awaiting general surgery's further plan.  2.  Essential hypertension.  Controlled.  Continue Zestril.  3.  Type 2 diabetes mellitus: Diet controlled at home.  Check hemoglobin A1c.   Initiate on SSI.     DVT prophylaxis: enoxaparin (LOVENOX) injection 40 mg Start: 09/03/20 0045   Code Status: Full Code  Family Communication:  None present at bedside.  Plan of care discussed with patient in length and he verbalized understanding and agreed with it.  Status is: Inpatient  Remains inpatient appropriate because:Inpatient level of care appropriate due to severity of illness   Dispo: The patient is from: Home              Anticipated d/c is to: Home              Anticipated d/c date is: 2 days              Patient currently is not medically stable to d/c.        Estimated body mass index is 32.36 kg/m as calculated from the following:   Height as of this encounter: 5\' 11"  (1.803 m).   Weight as of this encounter: 105.2 kg.      Nutritional status:               Consultants:   General surgery  Procedures:   None  Antimicrobials:  Anti-infectives (From admission, onward)   Start     Dose/Rate Route Frequency Ordered Stop   09/03/20 0900  meropenem (MERREM) 1 g in sodium chloride 0.9 % 100 mL IVPB        1 g 200 mL/hr over 30 Minutes Intravenous Every 8 hours 09/03/20 0147     09/03/20 0800  vancomycin (VANCOCIN) IVPB 1000 mg/200 mL premix  1,000 mg 200 mL/hr over 60 Minutes Intravenous Every 8 hours 09/03/20 0147     09/03/20 0200  meropenem (MERREM) 1 g in sodium chloride 0.9 % 100 mL IVPB        1 g 200 mL/hr over 30 Minutes Intravenous  Once 09/03/20 0038 09/03/20 0246   09/03/20 0045  vancomycin (VANCOCIN) IVPB 1000 mg/200 mL premix  Status:  Discontinued        1,000 mg 200 mL/hr over 60 Minutes Intravenous  Once 09/03/20 0038 09/03/20 0103   09/02/20 2345  vancomycin (VANCOCIN) IVPB 1000 mg/200 mL premix        1,000 mg 200 mL/hr over 60 Minutes Intravenous  Once 09/02/20 2331 09/03/20 0126   09/02/20 2345  piperacillin-tazobactam (ZOSYN) IVPB 3.375 g        3.375 g 100 mL/hr over 30 Minutes Intravenous  Once 09/02/20  2331 09/03/20 0041         Subjective: Seen and examined.  Still complains of severe pain.  No other complaint.  Objective: Vitals:   09/03/20 0630 09/03/20 0800 09/03/20 0900 09/03/20 1000  BP: 108/74 102/75 97/75 114/79  Pulse: 84 70 64 67  Resp: 19 19 18 20   Temp:      TempSrc:      SpO2: 97% 96% 92% 94%  Weight:      Height:        Intake/Output Summary (Last 24 hours) at 09/03/2020 1015 Last data filed at 09/03/2020 1011 Gross per 24 hour  Intake 549.81 ml  Output --  Net 549.81 ml   Filed Weights   09/02/20 2254  Weight: 105.2 kg    Examination:  General exam: Appears calm and comfortable, obese Respiratory system: Clear to auscultation. Respiratory effort normal. Cardiovascular system: S1 & S2 heard, RRR. No JVD, murmurs, rubs, gallops or clicks. No pedal edema. Gastrointestinal system: Abdomen is nondistended, soft and nontender. No organomegaly or masses felt. Normal bowel sounds heard.  Purulent erythema involving large part of the lower abdomen and bilateral groin and suprapubic area. Central nervous system: Alert and oriented. No focal neurological deficits. Extremities: Symmetric 5 x 5 power. Skin: No rashes, lesions or ulcers Psychiatry: Judgement and insight appear normal. Mood & affect appropriate.    Data Reviewed: I have personally reviewed following labs and imaging studies  CBC: Recent Labs  Lab 09/02/20 2300 09/03/20 0533  WBC 9.2 8.4  NEUTROABS 5.4  --   HGB 16.7 15.3  HCT 50.7 46.5  MCV 100.6* 100.6*  PLT 296 096   Basic Metabolic Panel: Recent Labs  Lab 09/02/20 2300 09/03/20 0533  NA 136 136  K 4.0 4.0  CL 103 106  CO2 23 23  GLUCOSE 125* 110*  BUN 11 9  CREATININE 0.66 0.64  CALCIUM 9.5 8.9   GFR: Estimated Creatinine Clearance: 147 mL/min (by C-G formula based on SCr of 0.64 mg/dL). Liver Function Tests: Recent Labs  Lab 09/02/20 2300  AST 37  ALT 42  ALKPHOS 83  BILITOT 0.5  PROT 8.8*  ALBUMIN 3.3*   No  results for input(s): LIPASE, AMYLASE in the last 168 hours. No results for input(s): AMMONIA in the last 168 hours. Coagulation Profile: No results for input(s): INR, PROTIME in the last 168 hours. Cardiac Enzymes: No results for input(s): CKTOTAL, CKMB, CKMBINDEX, TROPONINI in the last 168 hours. BNP (last 3 results) No results for input(s): PROBNP in the last 8760 hours. HbA1C: No results for input(s): HGBA1C in the last 72 hours.  CBG: No results for input(s): GLUCAP in the last 168 hours. Lipid Profile: No results for input(s): CHOL, HDL, LDLCALC, TRIG, CHOLHDL, LDLDIRECT in the last 72 hours. Thyroid Function Tests: No results for input(s): TSH, T4TOTAL, FREET4, T3FREE, THYROIDAB in the last 72 hours. Anemia Panel: No results for input(s): VITAMINB12, FOLATE, FERRITIN, TIBC, IRON, RETICCTPCT in the last 72 hours. Sepsis Labs: Recent Labs  Lab 09/02/20 2300  LATICACIDVEN 1.7    Recent Results (from the past 240 hour(s))  Culture, blood (routine x 2)     Status: None (Preliminary result)   Collection Time: 09/02/20 11:00 PM   Specimen: BLOOD  Result Value Ref Range Status   Specimen Description BLOOD BLOOD RIGHT FOREARM  Final   Special Requests   Final    BOTTLES DRAWN AEROBIC AND ANAEROBIC Blood Culture adequate volume   Culture   Final    NO GROWTH < 12 HOURS Performed at [facility], [address], [place], [place] [address]    Report Status PENDING  Incomplete  Resp Panel by RT-PCR (Flu A&B, Covid) Nasopharyngeal Swab     Status: None   Collection Time: 09/03/20 12:05 AM   Specimen: Nasopharyngeal Swab; Nasopharyngeal(NP) swabs in vial transport medium  Result Value Ref Range Status   SARS Coronavirus 2 by RT PCR NEGATIVE NEGATIVE Final    Comment: (NOTE) SARS-CoV-2 target nucleic acids are NOT DETECTED.  The SARS-CoV-2 RNA is generally detectable in upper respiratory specimens during the acute phase of infection. The lowest concentration of  SARS-CoV-2 viral copies this assay can detect is 138 copies/mL. A negative result does not preclude SARS-Cov-2 infection and should not be used as the sole basis for treatment or other patient management decisions. A negative result may occur with  improper specimen collection/handling, submission of specimen other than nasopharyngeal swab, presence of viral mutation(s) within the areas targeted by this assay, and inadequate number of viral copies(<138 copies/mL). A negative result must be combined with clinical observations, patient history, and epidemiological information. The expected result is Negative.  Fact Sheet for Patients:  EntrepreneurPulse.com.au  Fact Sheet for Healthcare Providers:  IncredibleEmployment.be  This test is no t yet approved or cleared by the [place] FDA and  has been authorized for detection and/or diagnosis of SARS-CoV-2 by FDA under an Emergency Use Authorization (EUA). This EUA will remain  in effect (meaning this test can be used) for the duration of the COVID-19 declaration under Section 564(b)(1) of the Act, 21 U.S.C.section 360bbb-3(b)(1), unless the authorization is terminated  or revoked sooner.       Influenza A by PCR NEGATIVE NEGATIVE Final   Influenza B by PCR NEGATIVE NEGATIVE Final    Comment: (NOTE) The Xpert Xpress SARS-CoV-2/FLU/RSV plus assay is intended as an aid in the diagnosis of influenza from Nasopharyngeal swab specimens and should not be used as a sole basis for treatment. Nasal washings and aspirates are unacceptable for Xpert Xpress SARS-CoV-2/FLU/RSV testing.  Fact Sheet for Patients: EntrepreneurPulse.com.au  Fact Sheet for Healthcare Providers: IncredibleEmployment.be  This test is not yet approved or cleared by the [place] FDA and has been authorized for detection and/or diagnosis of SARS-CoV-2 by FDA under an Emergency Use  Authorization (EUA). This EUA will remain in effect (meaning this test can be used) for the duration of the COVID-19 declaration under Section 564(b)(1) of the Act, 21 U.S.C. section 360bbb-3(b)(1), unless the authorization is terminated or revoked.  Performed at [facility], [place], [place]  [address]   Culture, blood (routine x 2)     Status: None (Preliminary result)   Collection Time: 09/03/20 12:05 AM   Specimen: BLOOD  Result Value Ref Range Status   Specimen Description BLOOD LEFT ANTECUBITAL  Final   Special Requests   Final    BOTTLES DRAWN AEROBIC AND ANAEROBIC Blood Culture adequate volume   Culture   Final    NO GROWTH < 12 HOURS Performed at [facility], [address] [place], [place] [address]    Report Status PENDING  Incomplete      Radiology Studies: CT Abdomen Pelvis W Contrast  Result Date: 09/03/2020 CLINICAL DATA:  Groin pain. EXAM: CT ABDOMEN AND PELVIS WITH CONTRAST TECHNIQUE: Multidetector CT imaging of the abdomen and pelvis was performed using the standard protocol following bolus administration of intravenous contrast. CONTRAST:  129mL OMNIPAQUE IOHEXOL 300 MG/ML  SOLN COMPARISON:  None. FINDINGS: Lower chest: No acute abnormality. Hepatobiliary: There is mild diffuse fatty infiltration of the liver parenchyma. No focal liver abnormality is seen. No gallstones, gallbladder wall thickening, or biliary dilatation. Pancreas: Unremarkable. No pancreatic ductal dilatation or surrounding inflammatory changes. Spleen: Normal in size without focal abnormality. Adrenals/Urinary Tract: The left adrenal gland is normal in appearance. A 1.4 cm fat density right adrenal mass is present. Kidneys are normal, without renal calculi, focal lesion, or hydronephrosis. Bladder is unremarkable. Stomach/Bowel: Stomach is within normal limits. Appendix appears normal. No evidence of bowel wall thickening, distention, or inflammatory changes.  Vascular/Lymphatic: No significant vascular findings are present. No enlarged abdominal or pelvic lymph nodes. Reproductive: Prostate is unremarkable. Other: A moderate amount of subcutaneous inflammatory fat stranding is seen along the anterior aspect of the lower pelvic wall, to the left of midline. This extends inferiorly to the level of the groin. Diffuse anterior pelvic wall cutaneous thickening is also seen within this region with 1.4 cm x 1.6 cm, 2.3 cm x 0.9 cm and 2.6 cm x 0.8 cm subcutaneous nodular appearing areas of increased attenuation (axial CT images 81 through 100, CT series number 2). No abdominopelvic ascites. Musculoskeletal: No acute or significant osseous findings. IMPRESSION: 1. Moderate severity cellulitis along the anterior aspect of the lower pelvic wall and groin. 2. Associated subcutaneous nodular appearing areas which may represent small abscesses. 3. Small right adrenal myelolipoma. Electronically Signed   By: Virgina Norfolk M.D.   On: 09/03/2020 00:03    Scheduled Meds: . enoxaparin (LOVENOX) injection  40 mg Subcutaneous Q24H  . lisinopril  2.5 mg Oral Daily   Continuous Infusions: . sodium chloride 100 mL/hr at 09/03/20 0212  . meropenem (MERREM) IV Stopped (09/03/20 0951)  . vancomycin Stopped (09/03/20 0915)     LOS: 0 days   Time spent: 32-minute   Darliss Cheney, MD [facility]  09/03/2020, 10:15 AM   To contact the attending provider between 7A-7P or the covering provider during after hours 7P-7A, please log into the web site [email or web address].

## 2020-09-04 ENCOUNTER — Inpatient Hospital Stay: Payer: 59 | Admitting: Anesthesiology

## 2020-09-04 ENCOUNTER — Encounter
Admission: EM | Disposition: A | Discharge status: Home Health | Payer: 59 | Source: Home / Self Care | Attending: Family Medicine

## 2020-09-04 DIAGNOSIS — A4901 Methicillin susceptible Staphylococcus aureus infection, unspecified site: Secondary | ICD-10-CM

## 2020-09-04 HISTORY — PX: INCISION AND DRAINAGE ABSCESS: SHX5864

## 2020-09-04 LAB — CBC
HCT: 45.4 % (ref 39.0–52.0)
Hemoglobin: 14.9 g/dL (ref 13.0–17.0)
MCH: 33.2 pg (ref 26.0–34.0)
MCHC: 32.8 g/dL (ref 30.0–36.0)
MCV: 101.1 fL — ABNORMAL HIGH (ref 80.0–100.0)
Platelets: 217 10*3/uL (ref 150–400)
RBC: 4.49 MIL/uL (ref 4.22–5.81)
RDW: 13.1 % (ref 11.5–15.5)
WBC: 6.2 10*3/uL (ref 4.0–10.5)
nRBC: 0 % (ref 0.0–0.2)

## 2020-09-04 LAB — COMPREHENSIVE METABOLIC PANEL
ALT: 31 U/L (ref 0–44)
AST: 25 U/L (ref 15–41)
Albumin: 2.8 g/dL — ABNORMAL LOW (ref 3.5–5.0)
Alkaline Phosphatase: 64 U/L (ref 38–126)
Anion gap: 7 (ref 5–15)
BUN: 8 mg/dL (ref 6–20)
CO2: 25 mmol/L (ref 22–32)
Calcium: 9.1 mg/dL (ref 8.9–10.3)
Chloride: 105 mmol/L (ref 98–111)
Creatinine, Ser: 0.52 mg/dL — ABNORMAL LOW (ref 0.61–1.24)
GFR, Estimated: >60 mL/min (ref >60)
Glucose, Bld: 106 mg/dL — ABNORMAL HIGH (ref 70–99)
Potassium: 3.9 mmol/L (ref 3.5–5.1)
Sodium: 137 mmol/L (ref 135–145)
Total Bilirubin: 0.7 mg/dL (ref 0.3–1.2)
Total Protein: 7.3 g/dL (ref 6.5–8.1)

## 2020-09-04 LAB — URINE CULTURE: Culture: NO GROWTH

## 2020-09-04 LAB — GLUCOSE, CAPILLARY
Glucose-Capillary: 102 mg/dL — ABNORMAL HIGH (ref 70–99)
Glucose-Capillary: 157 mg/dL — ABNORMAL HIGH (ref 70–99)
Glucose-Capillary: 141 mg/dL — ABNORMAL HIGH (ref 70–99)
Glucose-Capillary: 200 mg/dL — ABNORMAL HIGH (ref 70–99)
Glucose-Capillary: 182 mg/dL — ABNORMAL HIGH (ref 70–99)

## 2020-09-04 LAB — HEMOGLOBIN A1C
Hgb A1c MFr Bld: 6 % — ABNORMAL HIGH (ref 4.8–5.6)
Mean Plasma Glucose: 125.5 mg/dL

## 2020-09-04 SURGERY — INCISION AND DRAINAGE, ABSCESS
Anesthesia: General

## 2020-09-04 MED ORDER — ONDANSETRON HCL 4 MG/2ML IJ SOLN
INTRAMUSCULAR | Status: DC | PRN
  Administered 2020-09-04: 4 mg via INTRAVENOUS

## 2020-09-04 MED ORDER — ACETAMINOPHEN 10 MG/ML IV SOLN
INTRAVENOUS | Status: AC
  Filled 2020-09-04: qty 100

## 2020-09-04 MED ORDER — DEXAMETHASONE SODIUM PHOSPHATE 10 MG/ML IJ SOLN
INTRAMUSCULAR | Status: DC | PRN
  Administered 2020-09-04: 10 mg via INTRAVENOUS

## 2020-09-04 MED ORDER — GLYCOPYRROLATE 0.2 MG/ML IJ SOLN
INTRAMUSCULAR | Status: DC | PRN
  Administered 2020-09-04: .1 mg via INTRAVENOUS

## 2020-09-04 MED ORDER — FENTANYL CITRATE (PF) 100 MCG/2ML IJ SOLN
INTRAMUSCULAR | Status: AC
  Filled 2020-09-04: qty 2

## 2020-09-04 MED ORDER — LABETALOL HCL 5 MG/ML IV SOLN
INTRAVENOUS | Status: DC | PRN
  Administered 2020-09-04 (×2): 5 mg via INTRAVENOUS

## 2020-09-04 MED ORDER — BUPIVACAINE-EPINEPHRINE (PF) 0.25% -1:200000 IJ SOLN
INTRAMUSCULAR | Status: AC
  Filled 2020-09-04: qty 30

## 2020-09-04 MED ORDER — LIDOCAINE HCL (PF) 2 % IJ SOLN
INTRAMUSCULAR | Status: AC
  Filled 2020-09-04: qty 5

## 2020-09-04 MED ORDER — KETOROLAC TROMETHAMINE 30 MG/ML IJ SOLN
30.0000 mg | Freq: Four times a day (QID) | INTRAMUSCULAR | Status: DC
  Administered 2020-09-04 – 2020-09-07 (×9): 30 mg via INTRAVENOUS
  Filled 2020-09-04 (×10): qty 1

## 2020-09-04 MED ORDER — FENTANYL CITRATE (PF) 100 MCG/2ML IJ SOLN
25.0000 ug | INTRAMUSCULAR | Status: DC | PRN
  Administered 2020-09-04 (×2): 50 ug via INTRAVENOUS

## 2020-09-04 MED ORDER — DEXMEDETOMIDINE (PRECEDEX) IN NS 20 MCG/5ML (4 MCG/ML) IV SYRINGE
PREFILLED_SYRINGE | INTRAVENOUS | Status: DC | PRN
  Administered 2020-09-04: 8 ug via INTRAVENOUS
  Administered 2020-09-04: 12 ug via INTRAVENOUS

## 2020-09-04 MED ORDER — ONDANSETRON HCL 4 MG/2ML IJ SOLN: 4.0000 mg | Freq: Once | INTRAMUSCULAR | Status: DC | PRN

## 2020-09-04 MED ORDER — BUPIVACAINE LIPOSOME 1.3 % IJ SUSP
INTRAMUSCULAR | Status: AC
  Filled 2020-09-04: qty 20

## 2020-09-04 MED ORDER — LACTATED RINGERS IV SOLN: INTRAVENOUS | Status: DC | PRN

## 2020-09-04 MED ORDER — BUPIVACAINE-EPINEPHRINE 0.5% -1:200000 IJ SOLN
INTRAMUSCULAR | Status: DC | PRN
  Administered 2020-09-04: 30 mL

## 2020-09-04 MED ORDER — GLYCOPYRROLATE 0.2 MG/ML IJ SOLN
INTRAMUSCULAR | Status: AC
  Filled 2020-09-04: qty 1

## 2020-09-04 MED ORDER — BUPIVACAINE-EPINEPHRINE (PF) 0.5% -1:200000 IJ SOLN
INTRAMUSCULAR | Status: AC
  Filled 2020-09-04: qty 30

## 2020-09-04 MED ORDER — PROPOFOL 500 MG/50ML IV EMUL
INTRAVENOUS | Status: AC
  Filled 2020-09-04: qty 50

## 2020-09-04 MED ORDER — OXYCODONE HCL 5 MG PO TABS: 5.0000 mg | ORAL_TABLET | Freq: Once | ORAL | Status: DC | PRN

## 2020-09-04 MED ORDER — DEXAMETHASONE SODIUM PHOSPHATE 10 MG/ML IJ SOLN
INTRAMUSCULAR | Status: AC
  Filled 2020-09-04: qty 1

## 2020-09-04 MED ORDER — LIDOCAINE HCL (CARDIAC) PF 100 MG/5ML IV SOSY
PREFILLED_SYRINGE | INTRAVENOUS | Status: DC | PRN
  Administered 2020-09-04: 100 mg via INTRAVENOUS

## 2020-09-04 MED ORDER — DEXMEDETOMIDINE (PRECEDEX) IN NS 20 MCG/5ML (4 MCG/ML) IV SYRINGE
PREFILLED_SYRINGE | INTRAVENOUS | Status: AC
  Filled 2020-09-04: qty 5

## 2020-09-04 MED ORDER — BUPIVACAINE LIPOSOME 1.3 % IJ SUSP
INTRAMUSCULAR | Status: DC | PRN
  Administered 2020-09-04: 20 mL

## 2020-09-04 MED ORDER — MIDAZOLAM HCL 2 MG/2ML IJ SOLN
INTRAMUSCULAR | Status: AC
  Filled 2020-09-04: qty 2

## 2020-09-04 MED ORDER — KETOROLAC TROMETHAMINE 30 MG/ML IJ SOLN
INTRAMUSCULAR | Status: DC | PRN
  Administered 2020-09-04: 30 mg via INTRAVENOUS

## 2020-09-04 MED ORDER — FENTANYL CITRATE (PF) 100 MCG/2ML IJ SOLN
INTRAMUSCULAR | Status: DC | PRN
  Administered 2020-09-04: 50 ug via INTRAVENOUS
  Administered 2020-09-04: 25 ug via INTRAVENOUS
  Administered 2020-09-04 (×2): 50 ug via INTRAVENOUS
  Administered 2020-09-04: 25 ug via INTRAVENOUS
  Administered 2020-09-04 (×2): 50 ug via INTRAVENOUS

## 2020-09-04 MED ORDER — MIDAZOLAM HCL 2 MG/2ML IJ SOLN
INTRAMUSCULAR | Status: DC | PRN
  Administered 2020-09-04: 2 mg via INTRAVENOUS

## 2020-09-04 MED ORDER — ACETAMINOPHEN 500 MG PO TABS: 1000.0000 mg | ORAL_TABLET | Freq: Four times a day (QID) | ORAL | Status: DC | PRN

## 2020-09-04 MED ORDER — PROPOFOL 10 MG/ML IV BOLUS
INTRAVENOUS | Status: DC | PRN
  Administered 2020-09-04: 200 mg via INTRAVENOUS
  Administered 2020-09-04: 50 mg via INTRAVENOUS

## 2020-09-04 MED ORDER — OXYCODONE HCL 5 MG/5ML PO SOLN: 5.0000 mg | Freq: Once | ORAL | Status: DC | PRN

## 2020-09-04 MED ORDER — OXYCODONE HCL 5 MG PO TABS
5.0000 mg | ORAL_TABLET | ORAL | Status: DC | PRN
  Administered 2020-09-04 – 2020-09-06 (×8): 10 mg via ORAL
  Filled 2020-09-04 (×8): qty 2

## 2020-09-04 MED ORDER — ACETAMINOPHEN 10 MG/ML IV SOLN
INTRAVENOUS | Status: DC | PRN
  Administered 2020-09-04: 1000 mg via INTRAVENOUS

## 2020-09-04 MED ORDER — ONDANSETRON HCL 4 MG/2ML IJ SOLN
INTRAMUSCULAR | Status: AC
  Filled 2020-09-04: qty 2

## 2020-09-04 MED ORDER — HYDROMORPHONE HCL 1 MG/ML IJ SOLN
0.5000 mg | INTRAMUSCULAR | Status: DC | PRN
  Administered 2020-09-04 – 2020-09-07 (×3): 0.5 mg via INTRAVENOUS
  Filled 2020-09-04 (×3): qty 0.5

## 2020-09-04 SURGICAL SUPPLY — 25 items
BLADE SURG 15 STRL LF DISP TIS (BLADE) ×2 IMPLANT
BLADE SURG 15 STRL SS (BLADE) ×2
DRAIN PENROSE 1/4X12 LTX STRL (WOUND CARE) ×2 IMPLANT
DRAPE LAPAROTOMY 100X77 ABD (DRAPES) ×2 IMPLANT
ELECT CAUTERY BLADE 6.4 (BLADE) ×4 IMPLANT
ELECT REM PT RETURN 9FT ADLT (ELECTROSURGICAL) ×2
ELECTRODE REM PT RTRN 9FT ADLT (ELECTROSURGICAL) ×1 IMPLANT
GAUZE PACKING IODOFORM 1/2 (PACKING) ×2 IMPLANT
GAUZE SPONGE 4X4 12PLY STRL (GAUZE/BANDAGES/DRESSINGS) ×6 IMPLANT
GLOVE SURG SYN 7.0 (GLOVE) ×2 IMPLANT
GLOVE SURG SYN 7.5  E (GLOVE) ×1
GLOVE SURG SYN 7.5 E (GLOVE) ×1 IMPLANT
GOWN STRL REUS W/ TWL LRG LVL3 (GOWN DISPOSABLE) ×2 IMPLANT
GOWN STRL REUS W/TWL LRG LVL3 (GOWN DISPOSABLE) ×2
NDL SAFETY ECLIPSE 18X1.5 (NEEDLE) ×1 IMPLANT
NEEDLE HYPO 18GX1.5 SHARP (NEEDLE) ×1
NEEDLE HYPO 22GX1.5 SAFETY (NEEDLE) ×2 IMPLANT
NS IRRIG 500ML POUR BTL (IV SOLUTION) ×4 IMPLANT
PACK BASIN MINOR ARMC (MISCELLANEOUS) ×2 IMPLANT
PAD ABD DERMACEA PRESS 5X9 (GAUZE/BANDAGES/DRESSINGS) ×6 IMPLANT
SPONGE LAP 18X18 RF (DISPOSABLE) ×2 IMPLANT
SUT SILK 0 (SUTURE) ×1
SUT SILK 0 30XBRD TIE 6 (SUTURE) ×1 IMPLANT
SWAB CULTURE AMIES ANAERIB BLU (MISCELLANEOUS) ×2 IMPLANT
SYR 20ML LL LF (SYRINGE) ×2 IMPLANT

## 2020-09-04 NOTE — Anesthesia Postprocedure Evaluation (Signed)
Anesthesia Post Note  Patient: Curtis Peterson  Procedure(s) Performed: INCISION AND DRAINAGE SKIN ABSCESS (N/A )  Patient location during evaluation: PACU Anesthesia Type: General Level of consciousness: awake and alert Pain management: pain level controlled Vital Signs Assessment: post-procedure vital signs reviewed and stable Respiratory status: spontaneous breathing, nonlabored ventilation and respiratory function stable Cardiovascular status: blood pressure returned to baseline and stable Postop Assessment: no apparent nausea or vomiting Anesthetic complications: no   No complications documented.   Last Vitals:  Vitals:   09/04/20 1134 09/04/20 1204  BP: (!) 139/102 (!) 135/93  Pulse: 60 (!) 56  Resp: 14 16  Temp: 36.4 C 36.5 C  SpO2: 93% 94%    Last Pain:  Vitals:   09/04/20 1639  TempSrc:   PainSc: [place]

## 2020-09-04 NOTE — Op Note (Signed)
  Procedure Date:  09/04/2020  Pre-operative Diagnosis: Suprapubic hidradenitis abscesses  Post-operative Diagnosis: Suprapubic hidradenitis abscesses x6  Procedure:  Incision and Drainage of suprapubic hidradenitis abscess x6  Surgeon:  Melvyn Neth, MD  Anesthesia:  General endotracheal  Estimated Blood Loss: 10 ml  Specimens: Culture swab  Complications: None  Indications for Procedure:  This is a 44 y.o. male with diagnosis of suprapubic hidradenitis abscesses, requiring drainage procedure.  The risks of bleeding, abscess or infection, injury to surrounding structures, and need for further procedures were all discussed with the patient and was willing to proceed.  Description of Procedure: The patient was correctly identified in the preoperative area and brought into the operating room.  The patient was placed supine with VTE prophylaxis in place.  Appropriate time-outs were performed.  Anesthesia was induced and the patient was intubated.  Appropriate antibiotics were infused.  The patient's suprapubic area was prepped and draped in usual sterile fashion.  Identified 6 areas overall of induration with purulent drainage from hidradenitis abscesses.  These were ranging in size between 2 cm to 6 cm in size.  We will start with the biggest 1 and using cautery, we created an incision in the area of fluctuance and drainage.  Using Kelly forceps, this cavity tracked superiorly to another area of drainage in the suprapubic region superior to it.  Counterincision was made in the 2 cavities connected inside.  Similarly, incisions were made over each of the remaining areas.  Cautery was used to excise portion of the cysts down to subcutaneous tissue for better drainage and healing.  Each of these areas ranging in size between 2 and 4 cm in size.  Once all the areas were debrided and drained, we thoroughly irrigated each of the cavities.  Following that hemostasis was assured with cautery.  50  mL of Exparel solution combined with 0.5% bupivacaine with epi was infiltrated through all incisions for pain control.  In the larger cavity with the 2 incisions, 1/4 inch Penrose drain was looped around secured with 0 silk ties.  For the other cavities, these were all packed with half-inch iodoform gauze.  The area was fully cleaned and then dressed with dry gauze and ABD pads over all the incision sites.  The patient was then emerged from anesthesia, extubated, and brought back to the operating room for further management.  The patient tolerated the procedure well and all counts were correct at the end of the case.   Melvyn Neth, MD

## 2020-09-04 NOTE — Progress Notes (Signed)
09/04/2020  Subjective: No acute events.  WBC remains normal at 6.2 improved from yesterday.  Patient still having pain but is improved.  On board for the OR today for debridement.  Vital signs: Temp:  [97.1 F (36.2 C)-98.3 F (36.8 C)] 97.7 F (36.5 C) (11/28 1204) Pulse Rate:  [56-80] 56 (11/28 1204) Resp:  [11-23] 16 (11/28 1204) BP: (125-149)/(76-102) 135/93 (11/28 1204) SpO2:  [93 %-100 %] 94 % (11/28 1204)   Intake/Output: 11/27 0701 - 11/28 0700 In: 4747.2 [P.O.:480; I.V.:2679; IV Piggyback:1588.1] Out: 1450 [Urine:1450] Last BM Date:  (pta)  Physical Exam: Constitutional: No acute distress Skin: Patient has multiple areas of induration and fluctuance in the suprapubic region at the skin level consistent with hidradenitis with infection.  Labs:  Recent Labs    09/03/20 0533 09/04/20 0408  WBC 8.4 6.2  HGB 15.3 14.9  HCT 46.5 45.4  PLT 255 217   Recent Labs    09/03/20 0533 09/04/20 0408  NA 136 137  K 4.0 3.9  CL 106 105  CO2 23 25  GLUCOSE 110* 106*  BUN 9 8  CREATININE 0.64 0.52*  CALCIUM 8.9 9.1   No results for input(s): LABPROT, INR in the last 72 hours.  Imaging: No results found.  Assessment/Plan: This is a 44 y.o. male with infected hidradenitis of the suprapubic region.  -We will take the patient to the OR today for incision and drainage and possible excision of some of the hidradenitis cysts.  Patient will have a combination of packing versus drains in these areas depending on the size of the abscesses.   Melvyn Neth, [facility]

## 2020-09-04 NOTE — Brief Op Note (Signed)
09/04/2020  10:47 AM  PATIENT:  Curtis Peterson  44 y.o. male  PRE-OPERATIVE DIAGNOSIS:   Suprapubic skin abscesses from hydradenitis  POST-OPERATIVE DIAGNOSIS:   Suprapubic skin abscesses from hydradenitis  PROCEDURE:  Procedure(s) with comments: INCISION AND DRAINAGE SKIN ABSCESS (N/A) - Suprapubic area  Debridement of infected hydradenitis abscesses  SURGEON:  Surgeon(s) and Role:    * Reika Callanan, MD - Primary  ANESTHESIA:   general  EBL:  10 ml  BLOOD ADMINISTERED:none  DRAINS: Penrose drain in the suprapubic area x 1   LOCAL MEDICATIONS USED:  BUPIVICAINE   SPECIMEN:  Culture swabs  DISPOSITION OF SPECIMEN:  Micro  COUNTS:  YES  DICTATION: .Dragon Dictation  PLAN OF CARE: continue inpatient admission  PATIENT DISPOSITION:  PACU - hemodynamically stable.   Delay start of Pharmacological VTE agent (>24hrs) due to surgical blood loss or risk of bleeding: yes

## 2020-09-04 NOTE — Transfer of Care (Signed)
Immediate Anesthesia Transfer of Care Note  Patient: Curtis Peterson  Procedure(s) Performed: INCISION AND DRAINAGE SKIN ABSCESS (N/A )  Patient Location: PACU  Anesthesia Type:General  Level of Consciousness: awake, alert  and oriented  Airway & Oxygen Therapy: Patient connected to face mask oxygen  Post-op Assessment: Post -op Vital signs reviewed and stable  Post vital signs: stable  Last Vitals:  Vitals Value Taken Time  BP 146/100   Temp    Pulse 76 09/04/20 1048  Resp 15   SpO2 99 % 09/04/20 1048  Vitals shown include unvalidated device data.  Last Pain:  Vitals:   09/04/20 0753  TempSrc: Oral  PainSc:       Patients Stated Pain Goal: 1 (24/93/24 1991)  Complications: No complications documented.

## 2020-09-04 NOTE — Progress Notes (Signed)
PROGRESS NOTE    Curtis Peterson  OMV:[id number] DOB: 11-Nov-1975 DOA: 09/02/2020 PCP: Tracie Harrier, MD   Brief Narrative:  Curtis Peterson  is a 44 y.o. male with a known history of diabetes mellitus, hypertension hidradenitis suppurativa, who presented to the emergency room with acute onset of worsening lower abdominal wall and groin swelling with associated erythema, warmth and tenderness and some groin drainage.  He denies any fever or chills.  No nausea or vomiting.  He denied dysuria, oliguria or hematuria or flank pain.  No chest pain or dyspnea or cough.  He has not taken any outpatient antibiotics.  He has a history of groin and perineal hidradenitis suppurativa status post surgical excision.  Upon presentation to the emergency room, vital signs were within normal.  Labs revealed unremarkable CMP and CBC showed macrocytosis.  Respiratory panel is currently pending.  Blood cultures were drawn.  Abdominal pelvic CT scan revealed the following: 1. Moderate severity cellulitis along the anterior aspect of the lower pelvic wall and groin. 2. Associated subcutaneous nodular appearing areas which may represent small abscesses. 3. Small right adrenal myelolipoma.  The patient was ordered IV vancomycin and Zosyn, 4 mg of IV morphine sulfate and 4 mg of IV Zofran as well as 1 L bolus of IV normal saline.  He will be admitted to medical bed for further evaluation and management.   Assessment & Plan:   Active Problems:   Abdominal wall cellulitis   1.  Abdominal wall and groin severe purulent cellulitis in the setting of immunosuppression with type 2 diabetes mellitus and hidradenitis with possible small abscesses: General surgery on board.  Scheduled for debridement of infected hidradenitis abscesses.  Continue antibiotics.  He feels better.  2.  Essential hypertension.  Controlled this morning but then elevated after that.  Continue Zestril.  3.  Type 2 diabetes mellitus: Diet  controlled at home.  Hemoglobin A1c pending.  Continue SSI.  Blood sugar controlled.   DVT prophylaxis: enoxaparin (LOVENOX) injection 40 mg Start: 09/03/20 0045   Code Status: Full Code  Family Communication:  None present at bedside.  Plan of care discussed with patient in length and he verbalized understanding and agreed with it.  Status is: Inpatient  Remains inpatient appropriate because:Inpatient level of care appropriate due to severity of illness   Dispo: The patient is from: Home              Anticipated d/c is to: Home              Anticipated d/c date is: 2 days              Patient currently is not medically stable to d/c.        Estimated body mass index is 32.36 kg/m as calculated from the following:   Height as of this encounter: 5\' 11"  (1.803 m).   Weight as of this encounter: 105.2 kg.      Nutritional status:               Consultants:   General surgery  Procedures:   As above  Antimicrobials:  Anti-infectives (From admission, onward)   Start     Dose/Rate Route Frequency Ordered Stop   09/03/20 0900  meropenem (MERREM) 1 g in sodium chloride 0.9 % 100 mL IVPB        1 g 200 mL/hr over 30 Minutes Intravenous Every 8 hours 09/03/20 0147     09/03/20 0800  vancomycin (VANCOCIN) IVPB 1000  mg/200 mL premix        1,000 mg 200 mL/hr over 60 Minutes Intravenous Every 8 hours 09/03/20 0147     09/03/20 0200  meropenem (MERREM) 1 g in sodium chloride 0.9 % 100 mL IVPB        1 g 200 mL/hr over 30 Minutes Intravenous  Once 09/03/20 0038 09/03/20 0246   09/03/20 0045  vancomycin (VANCOCIN) IVPB 1000 mg/200 mL premix  Status:  Discontinued        1,000 mg 200 mL/hr over 60 Minutes Intravenous  Once 09/03/20 0038 09/03/20 0103   09/02/20 2345  vancomycin (VANCOCIN) IVPB 1000 mg/200 mL premix        1,000 mg 200 mL/hr over 60 Minutes Intravenous  Once 09/02/20 2331 09/03/20 0126   09/02/20 2345  piperacillin-tazobactam (ZOSYN) IVPB 3.375 g         3.375 g 100 mL/hr over 30 Minutes Intravenous  Once 09/02/20 2331 09/03/20 0041         Subjective: Seen and examined.  He states that he feels better.  Pain is improved.  No other complaint.  Objective: Vitals:   09/04/20 1116 09/04/20 1119 09/04/20 1134 09/04/20 1204  BP:  (!) 149/96 (!) 139/102 (!) 135/93  Pulse: 62 61 60 (!) 56  Resp: 11 11 14 16   Temp:  (!) 97.1 F (36.2 C) 97.6 F (36.4 C) 97.7 F (36.5 C)  TempSrc:   Oral Oral  SpO2: 95% 93% 93% 94%  Weight:      Height:        Intake/Output Summary (Last 24 hours) at 09/04/2020 1341 Last data filed at 09/04/2020 1020 Gross per 24 hour  Intake 3992.34 ml  Output 1450 ml  Net 2542.34 ml   Filed Weights   09/02/20 2254  Weight: 105.2 kg    Examination:  General exam: Appears calm and comfortable, obese Respiratory system: Clear to auscultation. Respiratory effort normal. Cardiovascular system: S1 & S2 heard, RRR. No JVD, murmurs, rubs, gallops or clicks. No pedal edema. Gastrointestinal system: Abdomen is nondistended, soft and nontender. No organomegaly or masses felt. Normal bowel sounds heard.  Purulent erythema involving large area of the lower abdomen and bilateral groin and suprapubic area. Central nervous system: Alert and oriented. No focal neurological deficits. Extremities: Symmetric 5 x 5 power. Skin: No rashes, lesions or ulcers.  Psychiatry: Judgement and insight appear normal. Mood & affect appropriate.    Data Reviewed: I have personally reviewed following labs and imaging studies  CBC: Recent Labs  Lab 09/02/20 2300 09/03/20 0533 09/04/20 0408  WBC 9.2 8.4 6.2  NEUTROABS 5.4  --   --   HGB 16.7 15.3 14.9  HCT 50.7 46.5 45.4  MCV 100.6* 100.6* 101.1*  PLT 296 255 818   Basic Metabolic Panel: Recent Labs  Lab 09/02/20 2300 09/03/20 0533 09/04/20 0408  NA 136 136 137  K 4.0 4.0 3.9  CL 103 106 105  CO2 23 23 25   GLUCOSE 125* 110* 106*  BUN 11 9 8   CREATININE 0.66 0.64  0.52*  CALCIUM 9.5 8.9 9.1   GFR: Estimated Creatinine Clearance: 147 mL/min (A) (by C-G formula based on SCr of 0.52 mg/dL (L)). Liver Function Tests: Recent Labs  Lab 09/02/20 2300 09/04/20 0408  AST 37 25  ALT 42 31  ALKPHOS 83 64  BILITOT 0.5 0.7  PROT 8.8* 7.3  ALBUMIN 3.3* 2.8*   No results for input(s): LIPASE, AMYLASE in the last 168 hours. No results  for input(s): AMMONIA in the last 168 hours. Coagulation Profile: No results for input(s): INR, PROTIME in the last 168 hours. Cardiac Enzymes: No results for input(s): CKTOTAL, CKMB, CKMBINDEX, TROPONINI in the last 168 hours. BNP (last 3 results) No results for input(s): PROBNP in the last 8760 hours. HbA1C: No results for input(s): HGBA1C in the last 72 hours. CBG: Recent Labs  Lab 09/03/20 1720 09/03/20 1953 09/04/20 0751 09/04/20 1051 09/04/20 1157  GLUCAP 135* 86 102* 157* 141*   Lipid Profile: No results for input(s): CHOL, HDL, LDLCALC, TRIG, CHOLHDL, LDLDIRECT in the last 72 hours. Thyroid Function Tests: No results for input(s): TSH, T4TOTAL, FREET4, T3FREE, THYROIDAB in the last 72 hours. Anemia Panel: No results for input(s): VITAMINB12, FOLATE, FERRITIN, TIBC, IRON, RETICCTPCT in the last 72 hours. Sepsis Labs: Recent Labs  Lab 09/02/20 2300  LATICACIDVEN 1.7    Recent Results (from the past 240 hour(s))  Culture, blood (routine x 2)     Status: None (Preliminary result)   Collection Time: 09/02/20 11:00 PM   Specimen: BLOOD  Result Value Ref Range Status   Specimen Description BLOOD BLOOD RIGHT FOREARM  Final   Special Requests   Final    BOTTLES DRAWN AEROBIC AND ANAEROBIC Blood Culture adequate volume   Culture   Final    NO GROWTH 1 DAY Performed at [facility], [address], [place], [place] [address]    Report Status PENDING  Incomplete  Resp Panel by RT-PCR (Flu A&B, Covid) Nasopharyngeal Swab     Status: None   Collection Time: 09/03/20 12:05 AM   Specimen:  Nasopharyngeal Swab; Nasopharyngeal(NP) swabs in vial transport medium  Result Value Ref Range Status   SARS Coronavirus 2 by RT PCR NEGATIVE NEGATIVE Final    Comment: (NOTE) SARS-CoV-2 target nucleic acids are NOT DETECTED.  The SARS-CoV-2 RNA is generally detectable in upper respiratory specimens during the acute phase of infection. The lowest concentration of SARS-CoV-2 viral copies this assay can detect is 138 copies/mL. A negative result does not preclude SARS-Cov-2 infection and should not be used as the sole basis for treatment or other patient management decisions. A negative result may occur with  improper specimen collection/handling, submission of specimen other than nasopharyngeal swab, presence of viral mutation(s) within the areas targeted by this assay, and inadequate number of viral copies(<138 copies/mL). A negative result must be combined with clinical observations, patient history, and epidemiological information. The expected result is Negative.  Fact Sheet for Patients:  EntrepreneurPulse.com.au  Fact Sheet for Healthcare Providers:  IncredibleEmployment.be  This test is no t yet approved or cleared by the [place] FDA and  has been authorized for detection and/or diagnosis of SARS-CoV-2 by FDA under an Emergency Use Authorization (EUA). This EUA will remain  in effect (meaning this test can be used) for the duration of the COVID-19 declaration under Section 564(b)(1) of the Act, 21 U.S.C.section 360bbb-3(b)(1), unless the authorization is terminated  or revoked sooner.       Influenza A by PCR NEGATIVE NEGATIVE Final   Influenza B by PCR NEGATIVE NEGATIVE Final    Comment: (NOTE) The Xpert Xpress SARS-CoV-2/FLU/RSV plus assay is intended as an aid in the diagnosis of influenza from Nasopharyngeal swab specimens and should not be used as a sole basis for treatment. Nasal washings and aspirates are unacceptable for  Xpert Xpress SARS-CoV-2/FLU/RSV testing.  Fact Sheet for Patients: EntrepreneurPulse.com.au  Fact Sheet for Healthcare Providers: IncredibleEmployment.be  This test is not yet approved  or cleared by the [place] and has been authorized for detection and/or diagnosis of SARS-CoV-2 by FDA under an Emergency Use Authorization (EUA). This EUA will remain in effect (meaning this test can be used) for the duration of the COVID-19 declaration under Section 564(b)(1) of the Act, 21 U.S.C. section 360bbb-3(b)(1), unless the authorization is terminated or revoked.  Performed at [facility], [address] [place], [place] [address]   Culture, blood (routine x 2)     Status: None (Preliminary result)   Collection Time: 09/03/20 12:05 AM   Specimen: BLOOD  Result Value Ref Range Status   Specimen Description BLOOD LEFT ANTECUBITAL  Final   Special Requests   Final    BOTTLES DRAWN AEROBIC AND ANAEROBIC Blood Culture adequate volume   Culture   Final    NO GROWTH 1 DAY Performed at [facility], [address] [place], [place] [address]    Report Status PENDING  Incomplete  Urine culture     Status: None   Collection Time: 09/03/20 12:05 AM   Specimen: Urine, Random  Result Value Ref Range Status   Specimen Description   Final    URINE, RANDOM Performed at [facility], [address], [place], [place] [address]    Special Requests   Final    NONE Performed at [facility], [address] [place], [place] [address]    Culture   Final    NO GROWTH Performed at [facility], [place] [address] [place],  [address]    Report Status 09/04/2020 FINAL  Final      Radiology Studies: CT Abdomen Pelvis W Contrast  Result Date: 09/03/2020 CLINICAL DATA:  Groin pain. EXAM: CT ABDOMEN AND PELVIS WITH CONTRAST TECHNIQUE: Multidetector CT imaging of the abdomen and pelvis was performed using  the standard protocol following bolus administration of intravenous contrast. CONTRAST:  115mL OMNIPAQUE IOHEXOL 300 MG/ML  SOLN COMPARISON:  None. FINDINGS: Lower chest: No acute abnormality. Hepatobiliary: There is mild diffuse fatty infiltration of the liver parenchyma. No focal liver abnormality is seen. No gallstones, gallbladder wall thickening, or biliary dilatation. Pancreas: Unremarkable. No pancreatic ductal dilatation or surrounding inflammatory changes. Spleen: Normal in size without focal abnormality. Adrenals/Urinary Tract: The left adrenal gland is normal in appearance. A 1.4 cm fat density right adrenal mass is present. Kidneys are normal, without renal calculi, focal lesion, or hydronephrosis. Bladder is unremarkable. Stomach/Bowel: Stomach is within normal limits. Appendix appears normal. No evidence of bowel wall thickening, distention, or inflammatory changes. Vascular/Lymphatic: No significant vascular findings are present. No enlarged abdominal or pelvic lymph nodes. Reproductive: Prostate is unremarkable. Other: A moderate amount of subcutaneous inflammatory fat stranding is seen along the anterior aspect of the lower pelvic wall, to the left of midline. This extends inferiorly to the level of the groin. Diffuse anterior pelvic wall cutaneous thickening is also seen within this region with 1.4 cm x 1.6 cm, 2.3 cm x 0.9 cm and 2.6 cm x 0.8 cm subcutaneous nodular appearing areas of increased attenuation (axial CT images 81 through 100, CT series number 2). No abdominopelvic ascites. Musculoskeletal: No acute or significant osseous findings. IMPRESSION: 1. Moderate severity cellulitis along the anterior aspect of the lower pelvic wall and groin. 2. Associated subcutaneous nodular appearing areas which may represent small abscesses. 3. Small right adrenal myelolipoma. Electronically Signed   By: Virgina Norfolk M.D.   On: 09/03/2020 00:03    Scheduled Meds: . enoxaparin (LOVENOX)  injection   40 mg Subcutaneous Q24H  . insulin aspart  0-15 Units Subcutaneous TID WC  . insulin aspart  0-5 Units Subcutaneous QHS  . ketorolac  30 mg Intravenous Q6H  . lisinopril  2.5 mg Oral Daily   Continuous Infusions: . sodium chloride 100 mL/hr at 09/04/20 1147  . meropenem (MERREM) IV 1 g (09/04/20 1148)  . vancomycin 1,000 mg (09/04/20 0826)     LOS: 1 day   Time spent: 30-minute   Darliss Cheney, MD [facility]  09/04/2020, 1:41 PM   To contact the attending provider between 7A-7P or the covering provider during after hours 7P-7A, please log into the web site [email or web address].

## 2020-09-04 NOTE — Anesthesia Preprocedure Evaluation (Addendum)
Anesthesia Evaluation  Patient identified by MRN, date of birth, ID band Patient awake    Reviewed: Allergy & Precautions, H&P , NPO status , Patient's Chart, lab work & pertinent test results  History of Anesthesia Complications Negative for: history of anesthetic complications  Airway Mallampati: III  TM Distance: >3 FB     Dental  (+) Poor Dentition, Chipped   Pulmonary neg sleep apnea, neg COPD, Current Smoker and Patient abstained from smoking.,           Cardiovascular hypertension, (-) angina(-) Past MI and (-) Cardiac Stents (-) dysrhythmias      Neuro/Psych negative neurological ROS  negative psych ROS   GI/Hepatic negative GI ROS, Neg liver ROS,   Endo/Other  diabetes  Renal/GU      Musculoskeletal   Abdominal   Peds  Hematology negative hematology ROS (+)   Anesthesia Other Findings Past Medical History: No date: Diabetes mellitus without complication (HCC) No date: Hypertension  Past Surgical History: 2015: head lesions removed 02/26/2020: HYDRADENITIS EXCISION; N/A     Comment:  Procedure: EXCISION HIDRADENITIS, Perineum;  Surgeon:               Jules Husbands, MD;  Location: [facility] ORS;  Service:               General;  Laterality: N/A; 06/30/2019: INCISION AND DRAINAGE PERIRECTAL ABSCESS; N/A     Comment:  Procedure: IRRIGATION AND DEBRIDEMENT PERIRECTAL               ABSCESS;  Surgeon: Jules Husbands, MD;  Location: [facility]               ORS;  Service: General;  Laterality: N/A;  BMI    Body Mass Index: 32.36 kg/m      Reproductive/Obstetrics negative OB ROS                            Anesthesia Physical Anesthesia Plan  ASA: II  Anesthesia Plan: General LMA   Post-op Pain Management:    Induction:   PONV Risk Score and Plan: Dexamethasone, Ondansetron, Midazolam and Treatment may vary due to age or medical condition  Airway Management Planned:   Additional  Equipment:   Intra-op Plan:   Post-operative Plan:   Informed Consent: I have reviewed the patients History and Physical, chart, labs and discussed the procedure including the risks, benefits and alternatives for the proposed anesthesia with the patient or authorized representative who has indicated his/her understanding and acceptance.     Dental Advisory Given  Plan Discussed with: Anesthesiologist, CRNA and Surgeon  Anesthesia Plan Comments:         Anesthesia Quick Evaluation

## 2020-09-04 NOTE — Progress Notes (Signed)
Pt tolerating PO fluids.  VSS, NAD.  Pain is tolerable.

## 2020-09-04 NOTE — Anesthesia Procedure Notes (Signed)
Procedure Name: LMA Insertion Date/Time: 09/04/2020 9:20 AM Performed by: Aline Brochure, CRNA Pre-anesthesia Checklist: Patient identified, Emergency Drugs available, Suction available and Patient being monitored Patient Re-evaluated:Patient Re-evaluated prior to induction Oxygen Delivery Method: Circle system utilized Preoxygenation: Pre-oxygenation with 100% oxygen Induction Type: IV induction Ventilation: Mask ventilation without difficulty LMA: LMA inserted LMA Size: 4.5 Number of attempts: 1 Placement Confirmation: positive ETCO2 and breath sounds checked- equal and bilateral Tube secured with: Tape Dental Injury: Teeth and Oropharynx as per pre-operative assessment

## 2020-09-05 ENCOUNTER — Encounter: Payer: Self-pay | Admitting: Surgery

## 2020-09-05 LAB — COMPREHENSIVE METABOLIC PANEL
ALT: 33 U/L (ref 0–44)
AST: 25 U/L (ref 15–41)
Albumin: 2.7 g/dL — ABNORMAL LOW (ref 3.5–5.0)
Alkaline Phosphatase: 64 U/L (ref 38–126)
Anion gap: 7 (ref 5–15)
BUN: 12 mg/dL (ref 6–20)
CO2: 21 mmol/L — ABNORMAL LOW (ref 22–32)
Calcium: 9.1 mg/dL (ref 8.9–10.3)
Chloride: 106 mmol/L (ref 98–111)
Creatinine, Ser: 0.68 mg/dL (ref 0.61–1.24)
GFR, Estimated: >60 mL/min (ref >60)
Glucose, Bld: 159 mg/dL — ABNORMAL HIGH (ref 70–99)
Potassium: 4.7 mmol/L (ref 3.5–5.1)
Sodium: 134 mmol/L — ABNORMAL LOW (ref 135–145)
Total Bilirubin: 0.6 mg/dL (ref 0.3–1.2)
Total Protein: 7.1 g/dL (ref 6.5–8.1)

## 2020-09-05 LAB — CBC
HCT: 45.6 % (ref 39.0–52.0)
Hemoglobin: 14.6 g/dL (ref 13.0–17.0)
MCH: 33 pg (ref 26.0–34.0)
MCHC: 32 g/dL (ref 30.0–36.0)
MCV: 102.9 fL — ABNORMAL HIGH (ref 80.0–100.0)
Platelets: 221 10*3/uL (ref 150–400)
RBC: 4.43 MIL/uL (ref 4.22–5.81)
RDW: 12.7 % (ref 11.5–15.5)
WBC: 9.2 10*3/uL (ref 4.0–10.5)
nRBC: 0 % (ref 0.0–0.2)

## 2020-09-05 LAB — GLUCOSE, CAPILLARY
Glucose-Capillary: 119 mg/dL — ABNORMAL HIGH (ref 70–99)
Glucose-Capillary: 116 mg/dL — ABNORMAL HIGH (ref 70–99)
Glucose-Capillary: 107 mg/dL — ABNORMAL HIGH (ref 70–99)
Glucose-Capillary: 94 mg/dL (ref 70–99)

## 2020-09-05 MED ORDER — POLYETHYLENE GLYCOL 3350 17 G PO PACK
17.0000 g | PACK | Freq: Every day | ORAL | Status: DC
  Administered 2020-09-05 – 2020-09-07 (×3): 17 g via ORAL
  Filled 2020-09-05 (×3): qty 1

## 2020-09-05 MED ORDER — INFLUENZA VAC SPLIT QUAD 0.5 ML IM SUSY
0.5000 mL | PREFILLED_SYRINGE | INTRAMUSCULAR | Status: AC
  Administered 2020-09-07: 0.5 mL via INTRAMUSCULAR
  Filled 2020-09-05: qty 0.5

## 2020-09-05 MED ORDER — ENOXAPARIN SODIUM 60 MG/0.6ML ~~LOC~~ SOLN
0.5000 mg/kg | SUBCUTANEOUS | Status: DC
  Administered 2020-09-05 – 2020-09-06 (×2): 52.5 mg via SUBCUTANEOUS
  Filled 2020-09-05 (×2): qty 0.6

## 2020-09-05 MED ORDER — SODIUM CHLORIDE 0.9 % IV SOLN
3.0000 g | Freq: Four times a day (QID) | INTRAVENOUS | Status: DC
  Administered 2020-09-05 – 2020-09-07 (×8): 3 g via INTRAVENOUS
  Filled 2020-09-05: qty 8
  Filled 2020-09-05: qty 3
  Filled 2020-09-05 (×9): qty 8

## 2020-09-05 NOTE — Progress Notes (Signed)
PROGRESS NOTE    Curtis Peterson  QQP:[id number] DOB: Apr 07, 1976 DOA: 09/02/2020 PCP: Tracie Harrier, MD   Brief Narrative:  Curtis Peterson  is a 44 y.o. male with a known history of diabetes mellitus, hypertension hidradenitis suppurativa, who presented to the emergency room with acute onset of worsening lower abdominal wall and groin swelling with associated erythema, warmth and tenderness and some groin drainage. He has a history of groin and perineal hidradenitis suppurativa status post surgical excision.  Upon presentation to the emergency room, vital signs were within normal.  Labs revealed unremarkable CMP and CBC showed macrocytosis.  Tested negative for COVID-19. Blood cultures were drawn.  Abdominal pelvic CT scan revealed the following: 1. Moderate severity cellulitis along the anterior aspect of the lower pelvic wall and groin. 2. Associated subcutaneous nodular appearing areas which may represent small abscesses. 3. Small right adrenal myelolipoma.  Patient was admitted to hospitalist service.  General surgery was consulted.  Broad-spectrum antibiotics were started.  Patient underwent incision and drainage of suprapubic hidradenitis abscesses x6 on 09/04/2020.  Assessment & Plan:   Active Problems:   Abdominal wall cellulitis   1.  Abdominal wall and groin severe purulent cellulitis in the setting of immunosuppression with type 2 diabetes mellitus and hidradenitis with possible small abscesses: General surgery on board.  Status post incision and drainage of 6 abscesses.  Cultures negative so far.  General surgery recommends continuing IV antibiotics.  Hopefully we will have culture reports back tomorrow and discharge home on p.o. antibiotics potential.  2.  Essential hypertension.  Controlled.  Continue Zestril.  3.  Type 2 diabetes mellitus: Diet controlled at home.  Hemoglobin A1c 6.0.  Controlled.  Continue SSI.     DVT prophylaxis:    Code Status: Full Code    Family Communication:  None present at bedside.  Plan of care discussed with patient in length and he verbalized understanding and agreed with it.  Status is: Inpatient  Remains inpatient appropriate because:Inpatient level of care appropriate due to severity of illness   Dispo: The patient is from: Home              Anticipated d/c is to: Home              Anticipated d/c date is: 1 day              Patient currently is not medically stable to d/c.        Estimated body mass index is 32.36 kg/m as calculated from the following:   Height as of this encounter: 5\' 11"  (1.803 m).   Weight as of this encounter: 105.2 kg.      Nutritional status:               Consultants:   General surgery  Procedures:   As above  Antimicrobials:  Anti-infectives (From admission, onward)   Start     Dose/Rate Route Frequency Ordered Stop   09/03/20 0900  meropenem (MERREM) 1 g in sodium chloride 0.9 % 100 mL IVPB        1 g 200 mL/hr over 30 Minutes Intravenous Every 8 hours 09/03/20 0147     09/03/20 0800  vancomycin (VANCOCIN) IVPB 1000 mg/200 mL premix        1,000 mg 200 mL/hr over 60 Minutes Intravenous Every 8 hours 09/03/20 0147     09/03/20 0200  meropenem (MERREM) 1 g in sodium chloride 0.9 % 100 mL IVPB  1 g 200 mL/hr over 30 Minutes Intravenous  Once 09/03/20 0038 09/03/20 0246   09/03/20 0045  vancomycin (VANCOCIN) IVPB 1000 mg/200 mL premix  Status:  Discontinued        1,000 mg 200 mL/hr over 60 Minutes Intravenous  Once 09/03/20 0038 09/03/20 0103   09/02/20 2345  vancomycin (VANCOCIN) IVPB 1000 mg/200 mL premix        1,000 mg 200 mL/hr over 60 Minutes Intravenous  Once 09/02/20 2331 09/03/20 0126   09/02/20 2345  piperacillin-tazobactam (ZOSYN) IVPB 3.375 g        3.375 g 100 mL/hr over 30 Minutes Intravenous  Once 09/02/20 2331 09/03/20 0041         Subjective: Seen and examined.  Still complains of pain at the infection site but it is  improving.  No other complaint.  Objective: Vitals:   09/04/20 2046 09/04/20 2320 09/05/20 0511 09/05/20 0756  BP: 128/80 126/84 (!) 132/93 (!) 115/98  Pulse: (!) 56 (!) 52 (!) 49 (!) 54  Resp: 18 14 18 16   Temp: 97.6 F (36.4 C) (!) 97.4 F (36.3 C) (!) 97.4 F (36.3 C) (!) 97.4 F (36.3 C)  TempSrc: Oral Oral Oral Oral  SpO2: 97% 94% 96%   Weight:      Height:        Intake/Output Summary (Last 24 hours) at 09/05/2020 1008 Last data filed at 09/05/2020 0200 Gross per 24 hour  Intake 2837.29 ml  Output --  Net 2837.29 ml   Filed Weights   09/02/20 2254  Weight: 105.2 kg    Examination:  General exam: Appears calm and comfortable, obese Respiratory system: Clear to auscultation. Respiratory effort normal. Cardiovascular system: S1 & S2 heard, RRR. No JVD, murmurs, rubs, gallops or clicks. No pedal edema. Gastrointestinal system: Abdomen is nondistended, soft and nontender. No organomegaly or masses felt. Normal bowel sounds heard. Central nervous system: Alert and oriented. No focal neurological deficits. Extremities: Symmetric 5 x 5 power. Skin: Dressing in the lower abdomen.  Direct visualization deferred to the general surgery. Psychiatry: Judgement and insight appear normal. Mood & affect appropriate.    Data Reviewed: I have personally reviewed following labs and imaging studies  CBC: Recent Labs  Lab 09/02/20 2300 09/03/20 0533 09/04/20 0408 09/05/20 0355  WBC 9.2 8.4 6.2 9.2  NEUTROABS 5.4  --   --   --   HGB 16.7 15.3 14.9 14.6  HCT 50.7 46.5 45.4 45.6  MCV 100.6* 100.6* 101.1* 102.9*  PLT 296 255 217 924   Basic Metabolic Panel: Recent Labs  Lab 09/02/20 2300 09/03/20 0533 09/04/20 0408 09/05/20 0355  NA 136 136 137 134*  K 4.0 4.0 3.9 4.7  CL 103 106 105 106  CO2 23 23 25  21*  GLUCOSE 125* 110* 106* 159*  BUN 11 9 8 12   CREATININE 0.66 0.64 0.52* 0.68  CALCIUM 9.5 8.9 9.1 9.1   GFR: Estimated Creatinine Clearance: 147 mL/min (by  C-G formula based on SCr of 0.68 mg/dL). Liver Function Tests: Recent Labs  Lab 09/02/20 2300 09/04/20 0408 09/05/20 0355  AST 37 25 25  ALT 42 31 33  ALKPHOS 83 64 64  BILITOT 0.5 0.7 0.6  PROT 8.8* 7.3 7.1  ALBUMIN 3.3* 2.8* 2.7*   No results for input(s): LIPASE, AMYLASE in the last 168 hours. No results for input(s): AMMONIA in the last 168 hours. Coagulation Profile: No results for input(s): INR, PROTIME in the last 168 hours. Cardiac Enzymes: No  results for input(s): CKTOTAL, CKMB, CKMBINDEX, TROPONINI in the last 168 hours. BNP (last 3 results) No results for input(s): PROBNP in the last 8760 hours. HbA1C: Recent Labs    09/03/20 0533  HGBA1C 6.0*   CBG: Recent Labs  Lab 09/04/20 1051 09/04/20 1157 09/04/20 1641 09/04/20 2233 09/05/20 0756  GLUCAP 157* 141* 200* 182* 119*   Lipid Profile: No results for input(s): CHOL, HDL, LDLCALC, TRIG, CHOLHDL, LDLDIRECT in the last 72 hours. Thyroid Function Tests: No results for input(s): TSH, T4TOTAL, FREET4, T3FREE, THYROIDAB in the last 72 hours. Anemia Panel: No results for input(s): VITAMINB12, FOLATE, FERRITIN, TIBC, IRON, RETICCTPCT in the last 72 hours. Sepsis Labs: Recent Labs  Lab 09/02/20 2300  LATICACIDVEN 1.7    Recent Results (from the past 240 hour(s))  Culture, blood (routine x 2)     Status: None (Preliminary result)   Collection Time: 09/02/20 11:00 PM   Specimen: BLOOD  Result Value Ref Range Status   Specimen Description BLOOD BLOOD RIGHT FOREARM  Final   Special Requests   Final    BOTTLES DRAWN AEROBIC AND ANAEROBIC Blood Culture adequate volume   Culture   Final    NO GROWTH 2 DAYS Performed at [facility], [address], [place], [place] [address]    Report Status PENDING  Incomplete  Resp Panel by RT-PCR (Flu A&B, Covid) Nasopharyngeal Swab     Status: None   Collection Time: 09/03/20 12:05 AM   Specimen: Nasopharyngeal Swab; Nasopharyngeal(NP) swabs in vial transport  medium  Result Value Ref Range Status   SARS Coronavirus 2 by RT PCR NEGATIVE NEGATIVE Final    Comment: (NOTE) SARS-CoV-2 target nucleic acids are NOT DETECTED.  The SARS-CoV-2 RNA is generally detectable in upper respiratory specimens during the acute phase of infection. The lowest concentration of SARS-CoV-2 viral copies this assay can detect is 138 copies/mL. A negative result does not preclude SARS-Cov-2 infection and should not be used as the sole basis for treatment or other patient management decisions. A negative result may occur with  improper specimen collection/handling, submission of specimen other than nasopharyngeal swab, presence of viral mutation(s) within the areas targeted by this assay, and inadequate number of viral copies(<138 copies/mL). A negative result must be combined with clinical observations, patient history, and epidemiological information. The expected result is Negative.  Fact Sheet for Patients:  EntrepreneurPulse.com.au  Fact Sheet for Healthcare Providers:  IncredibleEmployment.be  This test is no t yet approved or cleared by the [place] FDA and  has been authorized for detection and/or diagnosis of SARS-CoV-2 by FDA under an Emergency Use Authorization (EUA). This EUA will remain  in effect (meaning this test can be used) for the duration of the COVID-19 declaration under Section 564(b)(1) of the Act, 21 U.S.C.section 360bbb-3(b)(1), unless the authorization is terminated  or revoked sooner.       Influenza A by PCR NEGATIVE NEGATIVE Final   Influenza B by PCR NEGATIVE NEGATIVE Final    Comment: (NOTE) The Xpert Xpress SARS-CoV-2/FLU/RSV plus assay is intended as an aid in the diagnosis of influenza from Nasopharyngeal swab specimens and should not be used as a sole basis for treatment. Nasal washings and aspirates are unacceptable for Xpert Xpress SARS-CoV-2/FLU/RSV testing.  Fact Sheet for  Patients: EntrepreneurPulse.com.au  Fact Sheet for Healthcare Providers: IncredibleEmployment.be  This test is not yet approved or cleared by the [place] FDA and has been authorized for detection and/or diagnosis of SARS-CoV-2 by FDA under an Emergency  Use Authorization (EUA). This EUA will remain in effect (meaning this test can be used) for the duration of the COVID-19 declaration under Section 564(b)(1) of the Act, 21 U.S.C. section 360bbb-3(b)(1), unless the authorization is terminated or revoked.  Performed at [facility], [address] [place], [place] [address]   Culture, blood (routine x 2)     Status: None (Preliminary result)   Collection Time: 09/03/20 12:05 AM   Specimen: BLOOD  Result Value Ref Range Status   Specimen Description BLOOD LEFT ANTECUBITAL  Final   Special Requests   Final    BOTTLES DRAWN AEROBIC AND ANAEROBIC Blood Culture adequate volume   Culture   Final    NO GROWTH 2 DAYS Performed at [facility], [address] [place], [place] [address]    Report Status PENDING  Incomplete  Urine culture     Status: None   Collection Time: 09/03/20 12:05 AM   Specimen: Urine, Random  Result Value Ref Range Status   Specimen Description   Final    URINE, RANDOM Performed at [facility], [address] [place], [place] [address]    Special Requests   Final    NONE Performed at [facility], [address], [place], [place] [address]    Culture   Final    NO GROWTH Performed at [facility], [place] [address] [place], [place] [address]    Report Status 09/04/2020 FINAL  Final  Aerobic/Anaerobic Culture (surgical/deep wound)     Status: None (Preliminary result)   Collection Time: 09/04/20  9:45 AM   Specimen: PATH Cytology Misc. fluid; Body Fluid  Result Value Ref Range Status   Specimen Description   Final    ABSCESS Performed at [facility], [address] [place], [place] [address]    Special Requests   Final    NONE Performed at [facility], [address] [place], [place] [address]    Gram Stain   Final    RARE WBC PRESENT, PREDOMINANTLY PMN RARE GRAM POSITIVE COCCI IN PAIRS    Culture   Final    CULTURE REINCUBATED FOR BETTER GROWTH Performed at [facility], [place] [address] [place], [place] [address]    Report Status PENDING  Incomplete      Radiology Studies: No results found.  Scheduled Meds: . enoxaparin (LOVENOX) injection  0.5 mg/kg Subcutaneous Q24H  . [START ON 09/06/2020] influenza vac split quadrivalent PF  0.5 mL Intramuscular Tomorrow-1000  . insulin aspart  0-15 Units Subcutaneous TID WC  . insulin aspart  0-5 Units Subcutaneous QHS  . ketorolac  30 mg Intravenous Q6H  . lisinopril  2.5 mg Oral Daily  . polyethylene glycol  17 g Oral Daily   Continuous Infusions: . sodium chloride 100 mL/hr at 09/05/20 0237  . meropenem (MERREM) IV Stopped (09/05/20 0129)  . vancomycin 1,000 mg (09/05/20 0903)     LOS: 2 days   Time spent: 29-minute   Darliss Cheney, MD [facility]  09/05/2020, 10:08 AM   To contact the attending provider between 7A-7P or the covering provider during after hours 7P-7A, please log into the web site [email or web address].

## 2020-09-05 NOTE — Progress Notes (Signed)
PHARMACIST - PHYSICIAN COMMUNICATION  CONCERNING:  Enoxaparin (Lovenox) for DVT Prophylaxis    RECOMMENDATION: Patient was prescribed enoxaprin 40mg  q24 hours for VTE prophylaxis.   Filed Weights   09/02/20 2254  Weight: 105.2 kg (232 lb)    Body mass index is 32.36 kg/m.  Estimated Creatinine Clearance: 147 mL/min (by C-G formula based on SCr of 0.68 mg/dL).   Based on [place] patient is candidate for enoxaparin 0.5mg /kg TBW SQ every 24 hours based on BMI being >30.  DESCRIPTION: Pharmacy has adjusted enoxaparin dose per [facility] policy.  Patient is now receiving enoxaparin 52.5 mg every 24 hours    Lu Duffel, PharmD, BCPS Clinical Pharmacist 09/05/2020 7:48 AM

## 2020-09-05 NOTE — Progress Notes (Signed)
Pharmacy Antibiotic Note  Curtis Peterson is a 44 y.o. male admitted on 09/02/2020 with cellulitis.  Pharmacy has been consulted for Unasyn dosing.  Plan: Stopping Vanc/Meropenem  Will start Unasyn 3g q6h  Height: 5\' 11"  (180.3 cm) Weight: 105.2 kg (232 lb) IBW/kg (Calculated) : 75.3  Temp (24hrs), Avg:97.5 F (36.4 C), Min:97.4 F (36.3 C), Max:97.6 F (36.4 C)  Recent Labs  Lab 09/02/20 2300 09/03/20 0533 09/04/20 0408 09/05/20 0355  WBC 9.2 8.4 6.2 9.2  CREATININE 0.66 0.64 0.52* 0.68  LATICACIDVEN 1.7  --   --   --     Estimated Creatinine Clearance: 147 mL/min (by C-G formula based on SCr of 0.68 mg/dL).    No Known Allergies  Antimicrobials this admission:  Meropenem 11/27 >> 11/29  Vancomycin 11/27 >> 11/29  Unasyn 11/29 >>   Dose adjustments this admission: None  Microbiology results: 11/26 BCx: NG x 2 days 11/27 UCx: NG final     11/28 Wound culture: Few group C strep, pending  Thank you for allowing pharmacy to be a part of this patient's care.  Lu Duffel, PharmD, BCPS Clinical Pharmacist 09/05/2020 2:12 PM

## 2020-09-05 NOTE — Progress Notes (Signed)
[place] Hospital Day(s): 2.   Post op day(s): 1 Day Post-Op.   Interval History:  Patient seen and examined no acute events or new complaints overnight.  Patient reports he is doing well Some soreness but overall feels improved Remains without leukocytosis, WBC 9.2k; no fevers Renal function remains normal, sCr - 0.68 Cx from OR on 11/28 growing gram + cocci in pairs Remains on Meropenem & Vancomycin   Vital signs in last 24 hours: [min-max] current  Temp:  [97.1 F (36.2 C)-97.7 F (36.5 C)] 97.4 F (36.3 C) (11/29 0756) Pulse Rate:  [49-77] 54 (11/29 0756) Resp:  [11-18] 16 (11/29 0756) BP: (115-149)/(80-102) 115/98 (11/29 0756) SpO2:  [93 %-99 %] 96 % (11/29 0511)     Height: 5\' 11"  (180.3 cm) Weight: 105.2 kg BMI (Calculated): 32.37   Intake/Output last 2 shifts:  11/28 0701 - 11/29 0700 In: 2837.3 [P.O.:570; I.V.:1463.7; IV Piggyback:803.6] Out: -    Physical Exam:  Constitutional: alert, cooperative and no distress  Respiratory: breathing non-labored at rest  Cardiovascular: regular rate and sinus rhythm  Integumentary: Multiple I&D sites to the suprapubic region, largest contains penrose drain, the remaining wound contain gauze wicks, theres is some oozing, some induration but no residual fluctuance or purulent drainage appreciable.   Labs:  CBC Latest Ref Rng & Units 09/05/2020 09/04/2020 09/03/2020  WBC 4.0 - 10.5 K/uL 9.2 6.2 8.4  Hemoglobin 13.0 - 17.0 g/dL 14.6 14.9 15.3  Hematocrit 39 - 52 % 45.6 45.4 46.5  Platelets 150 - 400 K/uL 221 217 255   CMP Latest Ref Rng & Units 09/05/2020 09/04/2020 09/03/2020  Glucose 70 - 99 mg/dL 159(H) 106(H) 110(H)  BUN 6 - 20 mg/dL 12 8 9   Creatinine 0.61 - 1.24 mg/dL 0.68 0.52(L) 0.64  Sodium 135 - 145 mmol/L 134(L) 137 136  Potassium 3.5 - 5.1 mmol/L 4.7 3.9 4.0  Chloride 98 - 111 mmol/L 106 105 106  CO2 22 - 32 mmol/L 21(L) 25 23  Calcium 8.9 - 10.3 mg/dL 9.1 9.1 8.9   Total Protein 6.5 - 8.1 g/dL 7.1 7.3 -  Total Bilirubin 0.3 - 1.2 mg/dL 0.6 0.7 -  Alkaline Phos 38 - 126 U/L 64 64 -  AST 15 - 41 U/L 25 25 -  ALT 0 - 44 U/L 33 31 -     Imaging studies: No new pertinent imaging studies   Assessment/Plan:  44 y.o. male 1 Day Post-Op s/p Incision and Drainage of suprapubic hidradenitis abscess x6.   - Continue IV Abx (Meropenem & Vancomycin) follow up OR Cx; narrow for PO at home   - Continue BID dressing changes; packing with gauze, cover with dry gauze, secure with ABD pad and tape   - Pain control prn  - further management per primary service; we will follow     - Discharge Planning; Tentatively plan on discharge home tomorrow (11/30) with PO ABx, home dressing changes.   All of the above findings and recommendations were discussed with the patient, and the medical team, and all of patient's questions were answered to his expressed satisfaction.  -- Edison Simon, PA-C [facility] 09/05/2020, 9:21 AM [phone] M-F: 7am - 4pm

## 2020-09-06 ENCOUNTER — Inpatient Hospital Stay: Payer: 59 | Admitting: Anesthesiology

## 2020-09-06 ENCOUNTER — Encounter
Admission: EM | Disposition: A | Discharge status: Home Health | Payer: Self-pay | Source: Home / Self Care | Attending: Family Medicine

## 2020-09-06 ENCOUNTER — Encounter: Payer: Self-pay | Admitting: Family Medicine

## 2020-09-06 DIAGNOSIS — L7682 Other postprocedural complications of skin and subcutaneous tissue: Secondary | ICD-10-CM

## 2020-09-06 DIAGNOSIS — L7621 Postprocedural hemorrhage and hematoma of skin and subcutaneous tissue following a dermatologic procedure: Secondary | ICD-10-CM

## 2020-09-06 DIAGNOSIS — L7631 Postprocedural hematoma of skin and subcutaneous tissue following a dermatologic procedure: Secondary | ICD-10-CM

## 2020-09-06 DIAGNOSIS — T8141XA Infection following a procedure, superficial incisional surgical site, initial encounter: Secondary | ICD-10-CM

## 2020-09-06 HISTORY — PX: WOUND EXPLORATION: SHX6188

## 2020-09-06 LAB — GLUCOSE, CAPILLARY
Glucose-Capillary: 100 mg/dL — ABNORMAL HIGH (ref 70–99)
Glucose-Capillary: 76 mg/dL (ref 70–99)
Glucose-Capillary: 110 mg/dL — ABNORMAL HIGH (ref 70–99)
Glucose-Capillary: 114 mg/dL — ABNORMAL HIGH (ref 70–99)
Glucose-Capillary: 161 mg/dL — ABNORMAL HIGH (ref 70–99)

## 2020-09-06 LAB — BASIC METABOLIC PANEL
Anion gap: 8 (ref 5–15)
BUN: 16 mg/dL (ref 6–20)
CO2: 23 mmol/L (ref 22–32)
Calcium: 9 mg/dL (ref 8.9–10.3)
Chloride: 107 mmol/L (ref 98–111)
Creatinine, Ser: 0.78 mg/dL (ref 0.61–1.24)
GFR, Estimated: >60 mL/min (ref >60)
Glucose, Bld: 101 mg/dL — ABNORMAL HIGH (ref 70–99)
Potassium: 4.2 mmol/L (ref 3.5–5.1)
Sodium: 138 mmol/L (ref 135–145)

## 2020-09-06 LAB — HEMOGLOBIN AND HEMATOCRIT, BLOOD
HCT: 40.3 % (ref 39.0–52.0)
Hemoglobin: 13.3 g/dL (ref 13.0–17.0)

## 2020-09-06 LAB — TYPE AND SCREEN
ABO/RH(D): O POS
Antibody Screen: NEGATIVE

## 2020-09-06 SURGERY — WOUND EXPLORATION
Anesthesia: General | Site: Abdomen

## 2020-09-06 MED ORDER — OXYCODONE-ACETAMINOPHEN 7.5-325 MG PO TABS
1.0000 | ORAL_TABLET | Freq: Three times a day (TID) | ORAL | 0 refills | Status: AC | PRN
Start: 2020-09-06 — End: 2020-09-11

## 2020-09-06 MED ORDER — LIDOCAINE-EPINEPHRINE 1 %-1:100000 IJ SOLN
20.0000 mL | Freq: Once | INTRAMUSCULAR | Status: AC
  Administered 2020-09-06: 20 mL via INTRADERMAL
  Filled 2020-09-06: qty 20

## 2020-09-06 MED ORDER — DEXMEDETOMIDINE (PRECEDEX) IN NS 20 MCG/5ML (4 MCG/ML) IV SYRINGE
PREFILLED_SYRINGE | INTRAVENOUS | Status: AC
  Filled 2020-09-06: qty 5

## 2020-09-06 MED ORDER — OXYCODONE HCL 5 MG PO TABS: 5.0000 mg | ORAL_TABLET | Freq: Once | ORAL | Status: DC | PRN

## 2020-09-06 MED ORDER — DEXAMETHASONE SODIUM PHOSPHATE 10 MG/ML IJ SOLN
INTRAMUSCULAR | Status: DC | PRN
  Administered 2020-09-06: 5 mg via INTRAVENOUS

## 2020-09-06 MED ORDER — ACETAMINOPHEN 10 MG/ML IV SOLN
INTRAVENOUS | Status: DC | PRN
  Administered 2020-09-06: 1000 mg via INTRAVENOUS

## 2020-09-06 MED ORDER — ROCURONIUM BROMIDE 100 MG/10ML IV SOLN
INTRAVENOUS | Status: DC | PRN
  Administered 2020-09-06: 30 mg via INTRAVENOUS

## 2020-09-06 MED ORDER — LIDOCAINE HCL (PF) 2 % IJ SOLN
INTRAMUSCULAR | Status: AC
  Filled 2020-09-06: qty 5

## 2020-09-06 MED ORDER — FENTANYL CITRATE (PF) 100 MCG/2ML IJ SOLN
INTRAMUSCULAR | Status: AC
  Filled 2020-09-06: qty 2

## 2020-09-06 MED ORDER — SUCCINYLCHOLINE CHLORIDE 20 MG/ML IJ SOLN
INTRAMUSCULAR | Status: DC | PRN
  Administered 2020-09-06: 120 mg via INTRAVENOUS

## 2020-09-06 MED ORDER — ROCURONIUM BROMIDE 10 MG/ML (PF) SYRINGE
PREFILLED_SYRINGE | INTRAVENOUS | Status: AC
  Filled 2020-09-06: qty 10

## 2020-09-06 MED ORDER — ACETAMINOPHEN 10 MG/ML IV SOLN
INTRAVENOUS | Status: AC
  Filled 2020-09-06: qty 100

## 2020-09-06 MED ORDER — ONDANSETRON HCL 4 MG/2ML IJ SOLN
INTRAMUSCULAR | Status: AC
  Filled 2020-09-06: qty 2

## 2020-09-06 MED ORDER — FENTANYL CITRATE (PF) 100 MCG/2ML IJ SOLN
INTRAMUSCULAR | Status: DC | PRN
  Administered 2020-09-06: 100 ug via INTRAVENOUS
  Administered 2020-09-06: 25 ug via INTRAVENOUS
  Administered 2020-09-06: 50 ug via INTRAVENOUS
  Administered 2020-09-06: 100 ug via INTRAVENOUS
  Administered 2020-09-06: 25 ug via INTRAVENOUS

## 2020-09-06 MED ORDER — OXYCODONE HCL 5 MG/5ML PO SOLN: 5.0000 mg | Freq: Once | ORAL | Status: DC | PRN

## 2020-09-06 MED ORDER — ONDANSETRON HCL 4 MG/2ML IJ SOLN: 4.0000 mg | Freq: Once | INTRAMUSCULAR | Status: DC | PRN

## 2020-09-06 MED ORDER — LIDOCAINE-EPINEPHRINE 1 %-1:100000 IJ SOLN
20.0000 mL | Freq: Once | INTRAMUSCULAR | Status: DC
  Filled 2020-09-06: qty 20

## 2020-09-06 MED ORDER — AMOXICILLIN-POT CLAVULANATE 875-125 MG PO TABS: 1.0000 | ORAL_TABLET | Freq: Two times a day (BID) | ORAL | 0 refills | Status: AC

## 2020-09-06 MED ORDER — LIDOCAINE HCL (CARDIAC) PF 100 MG/5ML IV SOSY
PREFILLED_SYRINGE | INTRAVENOUS | Status: DC | PRN
  Administered 2020-09-06: 100 mg via INTRAVENOUS

## 2020-09-06 MED ORDER — ONDANSETRON HCL 4 MG/2ML IJ SOLN
INTRAMUSCULAR | Status: DC | PRN
  Administered 2020-09-06: 4 mg via INTRAVENOUS

## 2020-09-06 MED ORDER — SUCCINYLCHOLINE CHLORIDE 200 MG/10ML IV SOSY
PREFILLED_SYRINGE | INTRAVENOUS | Status: AC
  Filled 2020-09-06: qty 10

## 2020-09-06 MED ORDER — SUGAMMADEX SODIUM 200 MG/2ML IV SOLN
INTRAVENOUS | Status: DC | PRN
  Administered 2020-09-06: 200 mg via INTRAVENOUS

## 2020-09-06 MED ORDER — PROPOFOL 10 MG/ML IV BOLUS
INTRAVENOUS | Status: DC | PRN
  Administered 2020-09-06: 200 mg via INTRAVENOUS

## 2020-09-06 MED ORDER — FENTANYL CITRATE (PF) 100 MCG/2ML IJ SOLN
25.0000 ug | INTRAMUSCULAR | Status: DC | PRN
  Administered 2020-09-06 (×2): 50 ug via INTRAVENOUS

## 2020-09-06 MED ORDER — SILVER NITRATE-POT NITRATE 75-25 % EX MISC
2.0000 | Freq: Once | CUTANEOUS | Status: AC
  Administered 2020-09-06: 2 via TOPICAL
  Filled 2020-09-06: qty 2

## 2020-09-06 MED ORDER — MIDAZOLAM HCL 2 MG/2ML IJ SOLN
INTRAMUSCULAR | Status: DC | PRN
  Administered 2020-09-06: 2 mg via INTRAVENOUS

## 2020-09-06 MED ORDER — DEXMEDETOMIDINE (PRECEDEX) IN NS 20 MCG/5ML (4 MCG/ML) IV SYRINGE
PREFILLED_SYRINGE | INTRAVENOUS | Status: DC | PRN
  Administered 2020-09-06: 8 ug via INTRAVENOUS
  Administered 2020-09-06: 12 ug via INTRAVENOUS

## 2020-09-06 MED ORDER — DEXAMETHASONE SODIUM PHOSPHATE 10 MG/ML IJ SOLN
INTRAMUSCULAR | Status: AC
  Filled 2020-09-06: qty 1

## 2020-09-06 MED ORDER — SODIUM CHLORIDE 0.9 % IV BOLUS
1000.0000 mL | Freq: Once | INTRAVENOUS | Status: AC
  Administered 2020-09-06: 1000 mL via INTRAVENOUS

## 2020-09-06 MED ORDER — MIDAZOLAM HCL 2 MG/2ML IJ SOLN
INTRAMUSCULAR | Status: AC
  Filled 2020-09-06: qty 2

## 2020-09-06 SURGICAL SUPPLY — 28 items
BLADE CLIPPER SURG (BLADE) ×3 IMPLANT
BLADE SURG 15 STRL LF DISP TIS (BLADE) ×2 IMPLANT
BLADE SURG 15 STRL SS (BLADE) ×1
BRUSH SCRUB EZ  4% CHG (MISCELLANEOUS) ×1
BRUSH SCRUB EZ 4% CHG (MISCELLANEOUS) ×2 IMPLANT
CANISTER SUCT 3000ML PPV (MISCELLANEOUS) ×3 IMPLANT
COVER WAND RF STERILE (DRAPES) ×3 IMPLANT
DRAIN PENROSE 1/4X12 LTX STRL (WOUND CARE) ×3 IMPLANT
DRAPE LAPAROTOMY 77X122 PED (DRAPES) ×3 IMPLANT
ELECT CAUTERY BLADE TIP 2.5 (TIP) ×3
ELECT REM PT RETURN 9FT ADLT (ELECTROSURGICAL) ×3
ELECTRODE CAUTERY BLDE TIP 2.5 (TIP) ×2 IMPLANT
ELECTRODE REM PT RTRN 9FT ADLT (ELECTROSURGICAL) ×2 IMPLANT
GAUZE PACKING IODOFORM 1/2 (PACKING) ×3 IMPLANT
GAUZE SPONGE 4X4 12PLY STRL (GAUZE/BANDAGES/DRESSINGS) ×6 IMPLANT
GLOVE BIO SURGEON STRL SZ7 (GLOVE) ×6 IMPLANT
GOWN STRL REUS W/ TWL LRG LVL3 (GOWN DISPOSABLE) ×4 IMPLANT
GOWN STRL REUS W/TWL LRG LVL3 (GOWN DISPOSABLE) ×2
MANIFOLD NEPTUNE II (INSTRUMENTS) ×3 IMPLANT
NEEDLE HYPO 22GX1.5 SAFETY (NEEDLE) ×3 IMPLANT
NS IRRIG 1000ML POUR BTL (IV SOLUTION) ×3 IMPLANT
PACK BASIN MINOR (MISCELLANEOUS) ×3 IMPLANT
SOL PREP PVP 2OZ (MISCELLANEOUS) ×3
SOLUTION PREP PVP 2OZ (MISCELLANEOUS) ×2 IMPLANT
SPONGE LAP 18X18 RF (DISPOSABLE) ×9 IMPLANT
SUT SILK 2 0 (SUTURE) ×1
SUT SILK 2-0 18XBRD TIE 12 (SUTURE) ×2 IMPLANT
SYR BULB IRRIG 60ML STRL (SYRINGE) ×3 IMPLANT

## 2020-09-06 NOTE — TOC Initial Note (Addendum)
Transition of Care [facility]) - Initial/Assessment Note    Patient Details  Name: Curtis Peterson MRN: [id number] Date of Birth: 1976/08/16  [facility] CM/SW Contact:    Candie Chroman, LCSW Phone Number: 09/06/2020, 9:01 AM  Clinical Narrative: CSW called patient in room, introduced role, and explained that discharge planning would be discussed. Patient has TOC consult to set up home health for wound care. CSW made patient aware of likely difficulty in finding home health for [employer] patients. He said as a last resort, his girlfriend would be able to manage the wound care. She has done so before but would just need the education of knowing what to do if certain things like bleeding occur. [facility] is reviewing referral. No further concerns. CSW encouraged patient to contact CSW as needed. CSW will continue to follow patient for support and facilitate return home today.                 10:09 am: [facility] is not in network with patient's [employer] plan. [facility], [facility], and [facility] are unable to accept. [facility] is checking to see if they can accept. Left messages for [facility], and [facility] representatives.  10:27 am: [facility] unable to accept. [facility] not in network. Left message for [facility] representative. Faxed referral to [facility].  10:49 am: [facility] unable to accept. Faxed referral to [facility].  11:53 am: [facility] is able to accept referral. Notified them that patient likely will not discharge today due to bleeding, going to OR. Will keep them updated. Patient is aware and agreeable.  Expected Discharge Plan: [place] Barriers to Discharge: No Barriers Identified   Patient Goals and CMS Choice        Expected Discharge Plan and Services Expected Discharge Plan: [place] Choice: [place] arrangements for the past 2 months: Single Family Home Expected  Discharge Date: 09/06/20                                    Prior Living Arrangements/Services Living arrangements for the past 2 months: Single Family Home Lives with:: Significant Other Patient language and need for interpreter reviewed:: Yes Do you feel safe going back to the place where you live?: Yes      Need for Family Participation in Patient Care: Yes (Comment) Care giver support system in place?: Yes (comment)   Criminal Activity/Legal Involvement Pertinent to Current Situation/Hospitalization: No - Comment as needed  Activities of Daily Living Home Assistive Devices/Equipment: None ADL Screening (condition at time of admission) Patient's cognitive ability adequate to safely complete daily activities?: Yes Is the patient deaf or have difficulty hearing?: No Does the patient have difficulty seeing, even when wearing glasses/contacts?: No Does the patient have difficulty concentrating, remembering, or making decisions?: No Patient able to express need for assistance with ADLs?: Yes Does the patient have difficulty dressing or bathing?: No Independently performs ADLs?: Yes (appropriate for developmental age) Does the patient have difficulty walking or climbing stairs?: No Weakness of Legs: None Weakness of Arms/Hands: None  Permission Sought/Granted Permission sought to share information with : Facility Art therapist granted to share information with : Yes, Verbal Permission Granted     Permission granted to share info w AGENCY: [place]  Emotional Assessment Appearance:: Appears stated age Attitude/Demeanor/Rapport: Engaged, Gracious Affect (typically observed): Accepting, Appropriate, Calm, Pleasant Orientation: : Oriented to Self, Oriented to Place, Oriented to  Time, Oriented to Situation Alcohol / Substance Use: Not Applicable Psych Involvement: No (comment)  Admission diagnosis:  Hidradenitis [L73.2] Cellulitis of  groin [L03.314] Abdominal wall cellulitis [L03.311] Patient Active Problem List   Diagnosis Date Noted  . Abdominal wall cellulitis 09/03/2020  . Hidradenitis   . Abscess and cellulitis of gluteal region 06/30/2019  . Cellulitis and abscess of buttock 06/30/2019  . Abnormal LFTs (liver function tests) 06/26/2019  . Benign fasciculations 06/26/2019  . Benign essential hypertension 10/29/2018  . Hepatic steatosis 10/29/2018  . Type 2 diabetes mellitus with hyperglycemia, without long-term current use of insulin ([place]) 10/29/2018   PCP:  Tracie Harrier, MD Pharmacy:   [facility] [phone], [place] [address] Phone: [phone] Fax: [phone]     Social Determinants of Health (SDOH) Interventions    Readmission Risk Interventions No flowsheet data found.

## 2020-09-06 NOTE — Transfer of Care (Signed)
Immediate Anesthesia Transfer of Care Note  Patient: Curtis Peterson  Procedure(s) Performed: WOUND EXPLORATION (N/A Abdomen)  Patient Location: PACU  Anesthesia Type:General  Level of Consciousness: drowsy  Airway & Oxygen Therapy: Patient Spontanous Breathing and Patient connected to face mask oxygen  Post-op Assessment: Report given to RN  Post vital signs: stable  Last Vitals:  Vitals Value Taken Time  BP 131/90 09/06/20 1544  Temp    Pulse 96 09/06/20 1545  Resp 27 09/06/20 1545  SpO2 100 % 09/06/20 1545  Vitals shown include unvalidated device data.  Last Pain:  Vitals:   09/06/20 1418  TempSrc: Temporal  PainSc: 0-No pain      Patients Stated Pain Goal: 0 (14/10/30 1314)  Complications: No complications documented.

## 2020-09-06 NOTE — Progress Notes (Addendum)
ADDENDUM 11:03 AM: Unfortunately, he has had continued issues with bleeding from I&D site which could not be adequately controlled at bedside with multiple measures. We will have to bring him back to the OR for closer look and more definitive bleeding control. He will be NPO for now, IVF, and continue IV ABx, He and his wife are in agreement. Will gold on discharge for today.    [place] [facility] Day(s): 3.   Post op day(s): 2 Days Post-Op.   Interval History:  Patient seen and examined no acute events or new complaints overnight.  Patient reports he is doing okay, very anxious about dressing changes at home. His GF is coming in later to learn how to help with these.  He does report that his dressings have been very saturated in blood overnight No fever, chills, nausea, emesis Labs are reassuring this morning Cx from OR growing group C strep; susceptibilities pending Abx changed to Unasyn on 11/29  Vital signs in last 24 hours: [min-max] current  Temp:  [97.4 F (36.3 C)-98.9 F (37.2 C)] 98.9 F (37.2 C) (11/30 0424) Pulse Rate:  [54-66] 66 (11/30 0424) Resp:  [16-20] 17 (11/30 0424) BP: (115-141)/(85-99) 123/85 (11/30 0424) SpO2:  [96 %-99 %] 96 % (11/30 0424)     Height: 5\' 11"  (180.3 cm) Weight: 105.2 kg BMI (Calculated): 32.37   Intake/Output last 2 shifts:  11/29 0701 - 11/30 0700 In: 2403 [P.O.:980; I.V.:1123; IV Piggyback:300] Out: 1 [Stool:1]   Physical Exam:  Constitutional: alert, cooperative and no distress  Respiratory: breathing non-labored at rest  Cardiovascular: regular rate and sinus rhythm  Integumentary: Multiple I&D sites to the suprapubic region, largest contains penrose drain, the remaining wound contain gauze wicks, theres is some oozing, some induration but no residual fluctuance or purulent drainage appreciable. In his most central suprapubic incision there is significant oozing from deep in the wound bed. I  attempted to use silver nitrate on this area and also attempted to place a vicryl stitch without success. I was able to saturate gauze in lidocaine with epinephrine and packed the wound.   Labs:  CBC Latest Ref Rng & Units 09/05/2020 09/04/2020 09/03/2020  WBC 4.0 - 10.5 K/uL 9.2 6.2 8.4  Hemoglobin 13.0 - 17.0 g/dL 14.6 14.9 15.3  Hematocrit 39 - 52 % 45.6 45.4 46.5  Platelets 150 - 400 K/uL 221 217 255   CMP Latest Ref Rng & Units 09/06/2020 09/05/2020 09/04/2020  Glucose 70 - 99 mg/dL 101(H) 159(H) 106(H)  BUN 6 - 20 mg/dL 16 12 8   Creatinine 0.61 - 1.24 mg/dL 0.78 0.68 0.52(L)  Sodium 135 - 145 mmol/L 138 134(L) 137  Potassium 3.5 - 5.1 mmol/L 4.2 4.7 3.9  Chloride 98 - 111 mmol/L 107 106 105  CO2 22 - 32 mmol/L 23 21(L) 25  Calcium 8.9 - 10.3 mg/dL 9.0 9.1 9.1  Total Protein 6.5 - 8.1 g/dL - 7.1 7.3  Total Bilirubin 0.3 - 1.2 mg/dL - 0.6 0.7  Alkaline Phos 38 - 126 U/L - 64 64  AST 15 - 41 U/L - 25 25  ALT 0 - 44 U/L - 33 31    Imaging studies: No new pertinent imaging studies   Assessment/Plan:  44 y.o. male 2 Days Post-Op s/p Incision and Drainage of suprapubic hidradenitis abscess x6.    - Continue IV Abx (Unasyn); follow up OR Cx; narrow for PO at home and complete total of 14 days from I&D  -  Continue BID dressing changes; packing with gauze, cover with dry gauze, secure with ABD pad and tape. He will need to continue this at home.    - Pain control prn             - further management per primary service   All of the above findings and recommendations were discussed with the patient, and the medical team, and all of patient's questions were answered to his expressed satisfaction.  -- Edison Simon, PA-C [facility] 09/06/2020, 7:25 AM [phone] M-F: 7am - 4pm

## 2020-09-06 NOTE — Discharge Summary (Signed)
Physician Discharge Summary  Curtis Peterson QIO:[id number] DOB: August 17, 1976 DOA: 09/02/2020  PCP: Tracie Harrier, MD  Admit date: 09/02/2020 Discharge date: 09/06/2020  Admitted From: Home Disposition: Home  Recommendations for Outpatient Follow-up:  1. Follow up with PCP in 1-2 weeks 2. Follow-up with general surgery in 1 week 3. Please obtain BMP/CBC in one week 4. Please follow up with your PCP on the following pending results: Unresulted Labs (From admission, onward)         None       Home Health: Yes Equipment/Devices: None  Discharge Condition: Stable CODE STATUS: Full code Diet recommendation: Cardiac  Subjective: Patient seen and examined.  He was feeling better with only minimal pain however he was little apprehensive going home as he was bleeding from one of his surgical site which was eventually stopped by general surgery but he was not comfortable doing his own dressing and told me that his girlfriend is also not comfortable.  However he will go home and will feel comfortable if we can arrange home health for him.  TOC working on that.  Brief/Interim Summary: [name] a75 y.o.malewith a known history of diabetes mellitus, hypertension hidradenitis suppurativa, who presented to the emergency room with acute onset ofworsening lower abdominal wall and groin swelling with associated erythema, warmth and tenderness and some groin drainage.He has a history of groin and perineal hidradenitis suppurativa status post surgical excision.  Upon presentation to the emergency room, vital signs were within normal. Labs revealed unremarkable CMP and CBC showed macrocytosis.  Tested negative for COVID-19.Blood cultures were drawn. Abdominal pelvic CT scan revealed the following: 1. Moderate severity cellulitis along the anterior aspect of the lower pelvic wall and groin. 2. Associated subcutaneous nodular appearing areas which may represent small abscesses. 3.  Small right adrenal myelolipoma.  Patient was admitted to hospitalist service.  General surgery was consulted.  Broad-spectrum antibiotics were started.  Patient underwent incision and drainage of suprapubic hidradenitis abscesses x6 on 09/04/2020.  Dressing was changed.  He was continued on antibiotics.  His wound culture is growing gram-positive cocci in pairs, final sensitivities and cultures are still pending.  He was seen by general surgery.  He has been cleared for discharge and they have recommended Augmentin for total of 12 more days.  As mentioned above, he was found to have some oozing from surgical site this morning which was stopped by surgical PA.  Patient not comfortable doing his own dressing and so is his girlfriend.  Plan is that her girlfriend will be educated this morning to do the dressing changes and TOC is working on doing some arrangement for home health wound care for him.  He will be seen by general surgery PA 1 more time this afternoon and if no bleeding, he will be cleared for discharge.  Once all that is arranged, he will be discharged home today on oral antibiotics and have prescribed him 15 tablets of Percocet as well.  Discharge Diagnoses:  Active Problems:   Abdominal wall cellulitis    Discharge Instructions  Discharge Instructions    Discharge patient   Complete by: As directed    Discharge disposition: 01-Home or Self Care   Discharge patient date: 09/06/2020     Allergies as of 09/06/2020   No Known Allergies     Medication List    TAKE these medications   acetaminophen 325 MG tablet Commonly known as: TYLENOL Take 650 mg by mouth every 6 (six) hours as needed for moderate pain.  amoxicillin-clavulanate 875-125 MG tablet Commonly known as: Augmentin Take 1 tablet by mouth 2 (two) times daily for 12 days.   lisinopril 2.5 MG tablet Commonly known as: ZESTRIL Take 2.5 mg by mouth daily.   oxyCODONE-acetaminophen 7.5-325 MG tablet Commonly  known as: Percocet Take 1 tablet by mouth every 8 (eight) hours as needed for up to 5 days for severe pain.   valACYclovir 500 MG tablet Commonly known as: VALTREX Take 500 mg by mouth daily.       Follow-up Information    Tracie Harrier, MD Follow up in 1 week(s).   Specialty: Internal Medicine Contact information: [place] [address] [phone]        Olean Ree, MD. Schedule an appointment as soon as possible for a visit in 1 week.   Specialty: General Surgery Why: s/p I&D, has penrose drain, okay to see Thedore Mins if needed Contact information: [address] [place] [address] [phone]              No Known Allergies  Consultations: General surgery   Procedures/Studies: CT Abdomen Pelvis W Contrast  Result Date: 09/03/2020 CLINICAL DATA:  Groin pain. EXAM: CT ABDOMEN AND PELVIS WITH CONTRAST TECHNIQUE: Multidetector CT imaging of the abdomen and pelvis was performed using the standard protocol following bolus administration of intravenous contrast. CONTRAST:  132mL OMNIPAQUE IOHEXOL 300 MG/ML  SOLN COMPARISON:  None. FINDINGS: Lower chest: No acute abnormality. Hepatobiliary: There is mild diffuse fatty infiltration of the liver parenchyma. No focal liver abnormality is seen. No gallstones, gallbladder wall thickening, or biliary dilatation. Pancreas: Unremarkable. No pancreatic ductal dilatation or surrounding inflammatory changes. Spleen: Normal in size without focal abnormality. Adrenals/Urinary Tract: The left adrenal gland is normal in appearance. A 1.4 cm fat density right adrenal mass is present. Kidneys are normal, without renal calculi, focal lesion, or hydronephrosis. Bladder is unremarkable. Stomach/Bowel: Stomach is within normal limits. Appendix appears normal. No evidence of bowel wall thickening, distention, or inflammatory changes. Vascular/Lymphatic: No significant vascular findings  are present. No enlarged abdominal or pelvic lymph nodes. Reproductive: Prostate is unremarkable. Other: A moderate amount of subcutaneous inflammatory fat stranding is seen along the anterior aspect of the lower pelvic wall, to the left of midline. This extends inferiorly to the level of the groin. Diffuse anterior pelvic wall cutaneous thickening is also seen within this region with 1.4 cm x 1.6 cm, 2.3 cm x 0.9 cm and 2.6 cm x 0.8 cm subcutaneous nodular appearing areas of increased attenuation (axial CT images 81 through 100, CT series number 2). No abdominopelvic ascites. Musculoskeletal: No acute or significant osseous findings. IMPRESSION: 1. Moderate severity cellulitis along the anterior aspect of the lower pelvic wall and groin. 2. Associated subcutaneous nodular appearing areas which may represent small abscesses. 3. Small right adrenal myelolipoma. Electronically Signed   By: Virgina Norfolk M.D.   On: 09/03/2020 00:03     Discharge Exam: Vitals:   09/06/20 0424 09/06/20 0747  BP: 123/85 (!) 132/94  Pulse: 66 63  Resp: 17 20  Temp: 98.9 F (37.2 C) 98 F (36.7 C)  SpO2: 96% 100%   Vitals:   09/05/20 1927 09/05/20 2330 09/06/20 0424 09/06/20 0747  BP: 126/90 (!) 141/99 123/85 (!) 132/94  Pulse: (!) 54 63 66 63  Resp: 19 20 17 20   Temp: 98.7 F (37.1 C) 98.1 F (36.7 C) 98.9 F (37.2 C) 98 F (36.7 C)  TempSrc: Oral Oral Oral Oral  SpO2: 99% 97% 96% 100%  Weight:      Height:        General: Pt is alert, awake, not in acute distress Cardiovascular: RRR, S1/S2 +, no rubs, no gallops Respiratory: CTA bilaterally, no wheezing, no rhonchi Abdominal: Soft, NT, ND, bowel sounds + Extremities: no edema, no cyanosis    The results of significant diagnostics from this hospitalization (including imaging, microbiology, ancillary and laboratory) are listed below for reference.     Microbiology: Recent Results (from the past 240 hour(s))  Culture, blood (routine x 2)      Status: None (Preliminary result)   Collection Time: 09/02/20 11:00 PM   Specimen: BLOOD  Result Value Ref Range Status   Specimen Description BLOOD BLOOD RIGHT FOREARM  Final   Special Requests   Final    BOTTLES DRAWN AEROBIC AND ANAEROBIC Blood Culture adequate volume   Culture   Final    NO GROWTH 3 DAYS Performed at [facility], [address], [place], [place] [address]    Report Status PENDING  Incomplete  Resp Panel by RT-PCR (Flu A&B, Covid) Nasopharyngeal Swab     Status: None   Collection Time: 09/03/20 12:05 AM   Specimen: Nasopharyngeal Swab; Nasopharyngeal(NP) swabs in vial transport medium  Result Value Ref Range Status   SARS Coronavirus 2 by RT PCR NEGATIVE NEGATIVE Final    Comment: (NOTE) SARS-CoV-2 target nucleic acids are NOT DETECTED.  The SARS-CoV-2 RNA is generally detectable in upper respiratory specimens during the acute phase of infection. The lowest concentration of SARS-CoV-2 viral copies this assay can detect is 138 copies/mL. A negative result does not preclude SARS-Cov-2 infection and should not be used as the sole basis for treatment or other patient management decisions. A negative result may occur with  improper specimen collection/handling, submission of specimen other than nasopharyngeal swab, presence of viral mutation(s) within the areas targeted by this assay, and inadequate number of viral copies(<138 copies/mL). A negative result must be combined with clinical observations, patient history, and epidemiological information. The expected result is Negative.  Fact Sheet for Patients:  EntrepreneurPulse.com.au  Fact Sheet for Healthcare Providers:  IncredibleEmployment.be  This test is no t yet approved or cleared by the [place] FDA and  has been authorized for detection and/or diagnosis of SARS-CoV-2 by FDA under an Emergency Use Authorization (EUA). This EUA will remain  in effect  (meaning this test can be used) for the duration of the COVID-19 declaration under Section 564(b)(1) of the Act, 21 U.S.C.section 360bbb-3(b)(1), unless the authorization is terminated  or revoked sooner.       Influenza A by PCR NEGATIVE NEGATIVE Final   Influenza B by PCR NEGATIVE NEGATIVE Final    Comment: (NOTE) The Xpert Xpress SARS-CoV-2/FLU/RSV plus assay is intended as an aid in the diagnosis of influenza from Nasopharyngeal swab specimens and should not be used as a sole basis for treatment. Nasal washings and aspirates are unacceptable for Xpert Xpress SARS-CoV-2/FLU/RSV testing.  Fact Sheet for Patients: EntrepreneurPulse.com.au  Fact Sheet for Healthcare Providers: IncredibleEmployment.be  This test is not yet approved or cleared by the [place] FDA and has been authorized for detection and/or diagnosis of SARS-CoV-2 by FDA under an Emergency Use Authorization (EUA). This EUA will remain in effect (meaning this test can be used) for the duration of the COVID-19 declaration under Section 564(b)(1) of the Act, 21 U.S.C. section 360bbb-3(b)(1), unless the authorization is terminated or revoked.  Performed at [facility],  [place], [place] [address]   Culture, blood (routine x 2)     Status: None (Preliminary result)   Collection Time: 09/03/20 12:05 AM   Specimen: BLOOD  Result Value Ref Range Status   Specimen Description BLOOD LEFT ANTECUBITAL  Final   Special Requests   Final    BOTTLES DRAWN AEROBIC AND ANAEROBIC Blood Culture adequate volume   Culture   Final    NO GROWTH 3 DAYS Performed at [facility], [address], [place], [place] [address]    Report Status PENDING  Incomplete  Urine culture     Status: None   Collection Time: 09/03/20 12:05 AM   Specimen: Urine, Random  Result Value Ref Range Status   Specimen Description   Final    URINE, RANDOM Performed at [facility], [address] [place], [place] [address]    Special Requests   Final    NONE Performed at [facility], [address], [place], [place] [address]    Culture   Final    NO GROWTH Performed at [facility], [place] [address] [place], [place] [address]    Report Status 09/04/2020 FINAL  Final  Aerobic/Anaerobic Culture (surgical/deep wound)     Status: None (Preliminary result)   Collection Time: 09/04/20  9:45 AM   Specimen: PATH Cytology Misc. fluid; Body Fluid  Result Value Ref Range Status   Specimen Description   Final    ABSCESS Performed at [facility], [address] [place], [place] [address]    Special Requests   Final    NONE Performed at [facility], [address] [place], [place] [address]    Gram Stain   Final    RARE WBC PRESENT, PREDOMINANTLY PMN RARE GRAM POSITIVE COCCI IN PAIRS    Culture   Final    FEW STREPTOCOCCUS GROUP C RARE STAPHYLOCOCCUS AUREUS SUSCEPTIBILITIES TO FOLLOW Performed at [facility], [place] [address], [place], [place] [address]    Report Status PENDING  Incomplete     Labs: BNP (last 3 results) No results for input(s): BNP in the last 8760 hours. Basic Metabolic Panel: Recent Labs  Lab 09/02/20 2300 09/03/20 0533 09/04/20 0408 09/05/20 0355 09/06/20 0536  NA 136 136 137 134* 138  K 4.0 4.0 3.9 4.7 4.2  CL 103 106 105 106 107  CO2 23 23 25  21* 23  GLUCOSE 125* 110* 106* 159* 101*  BUN 11 9 8 12 16   CREATININE 0.66 0.64 0.52* 0.68 0.78  CALCIUM 9.5 8.9 9.1 9.1 9.0   Liver Function Tests: Recent Labs  Lab 09/02/20 2300 09/04/20 0408 09/05/20 0355  AST 37 25 25  ALT 42 31 33  ALKPHOS 83 64 64  BILITOT 0.5 0.7 0.6  PROT 8.8* 7.3 7.1  ALBUMIN 3.3* 2.8* 2.7*   No results for input(s): LIPASE, AMYLASE in the last 168 hours. No results for input(s): AMMONIA in the last 168 hours. CBC: Recent Labs  Lab 09/02/20 2300 09/03/20 0533 09/04/20 0408  09/05/20 0355  WBC 9.2 8.4 6.2 9.2  NEUTROABS 5.4  --   --   --   HGB 16.7 15.3 14.9 14.6  HCT 50.7 46.5 45.4 45.6  MCV 100.6* 100.6* 101.1* 102.9*  PLT 296 255 217 221   Cardiac Enzymes: No results for input(s): CKTOTAL, CKMB, CKMBINDEX, TROPONINI in the last 168 hours. BNP: Invalid input(s): POCBNP CBG: Recent Labs  Lab 09/05/20 0756  09/05/20 1216 09/05/20 1646 09/05/20 2117 09/06/20 0725  GLUCAP 119* 116* 107* 94 100*   D-Dimer No results for input(s): DDIMER in the last 72 hours. Hgb A1c No results for input(s): HGBA1C in the last 72 hours. Lipid Profile No results for input(s): CHOL, HDL, LDLCALC, TRIG, CHOLHDL, LDLDIRECT in the last 72 hours. Thyroid function studies No results for input(s): TSH, T4TOTAL, T3FREE, THYROIDAB in the last 72 hours.  Invalid input(s): FREET3 Anemia work up No results for input(s): VITAMINB12, FOLATE, FERRITIN, TIBC, IRON, RETICCTPCT in the last 72 hours. Urinalysis    Component Value Date/Time   COLORURINE YELLOW (A) 09/03/2020 0005   APPEARANCEUR CLEAR (A) 09/03/2020 0005   APPEARANCEUR Cloudy (A) 10/20/2019 1105   LABSPEC 1.031 (H) 09/03/2020 0005   PHURINE 5.0 09/03/2020 0005   GLUCOSEU NEGATIVE 09/03/2020 0005   HGBUR SMALL (A) 09/03/2020 0005   BILIRUBINUR NEGATIVE 09/03/2020 0005   BILIRUBINUR Negative 10/20/2019 1105   KETONESUR NEGATIVE 09/03/2020 0005   PROTEINUR 30 (A) 09/03/2020 0005   NITRITE NEGATIVE 09/03/2020 0005   LEUKOCYTESUR NEGATIVE 09/03/2020 0005   Sepsis Labs Invalid input(s): PROCALCITONIN,  WBC,  LACTICIDVEN Microbiology Recent Results (from the past 240 hour(s))  Culture, blood (routine x 2)     Status: None (Preliminary result)   Collection Time: 09/02/20 11:00 PM   Specimen: BLOOD  Result Value Ref Range Status   Specimen Description BLOOD BLOOD RIGHT FOREARM  Final   Special Requests   Final    BOTTLES DRAWN AEROBIC AND ANAEROBIC Blood Culture adequate volume   Culture   Final    NO GROWTH  3 DAYS Performed at [facility], [address] [place], [place] [address]    Report Status PENDING  Incomplete  Resp Panel by RT-PCR (Flu A&B, Covid) Nasopharyngeal Swab     Status: None   Collection Time: 09/03/20 12:05 AM   Specimen: Nasopharyngeal Swab; Nasopharyngeal(NP) swabs in vial transport medium  Result Value Ref Range Status   SARS Coronavirus 2 by RT PCR NEGATIVE NEGATIVE Final    Comment: (NOTE) SARS-CoV-2 target nucleic acids are NOT DETECTED.  The SARS-CoV-2 RNA is generally detectable in upper respiratory specimens during the acute phase of infection. The lowest concentration of SARS-CoV-2 viral copies this assay can detect is 138 copies/mL. A negative result does not preclude SARS-Cov-2 infection and should not be used as the sole basis for treatment or other patient management decisions. A negative result may occur with  improper specimen collection/handling, submission of specimen other than nasopharyngeal swab, presence of viral mutation(s) within the areas targeted by this assay, and inadequate number of viral copies(<138 copies/mL). A negative result must be combined with clinical observations, patient history, and epidemiological information. The expected result is Negative.  Fact Sheet for Patients:  EntrepreneurPulse.com.au  Fact Sheet for Healthcare Providers:  IncredibleEmployment.be  This test is no t yet approved or cleared by the [place] FDA and  has been authorized for detection and/or diagnosis of SARS-CoV-2 by FDA under an Emergency Use Authorization (EUA). This EUA will remain  in effect (meaning this test can be used) for the duration of the COVID-19 declaration under Section 564(b)(1) of the Act, 21 U.S.C.section 360bbb-3(b)(1), unless the authorization is terminated  or revoked sooner.       Influenza A by PCR NEGATIVE NEGATIVE Final   Influenza B by PCR NEGATIVE NEGATIVE Final     Comment: (NOTE) The Xpert Xpress SARS-CoV-2/FLU/RSV plus assay is intended as an aid in the diagnosis  of influenza from Nasopharyngeal swab specimens and should not be used as a sole basis for treatment. Nasal washings and aspirates are unacceptable for Xpert Xpress SARS-CoV-2/FLU/RSV testing.  Fact Sheet for Patients: EntrepreneurPulse.com.au  Fact Sheet for Healthcare Providers: IncredibleEmployment.be  This test is not yet approved or cleared by the [place] FDA and has been authorized for detection and/or diagnosis of SARS-CoV-2 by FDA under an Emergency Use Authorization (EUA). This EUA will remain in effect (meaning this test can be used) for the duration of the COVID-19 declaration under Section 564(b)(1) of the Act, 21 U.S.C. section 360bbb-3(b)(1), unless the authorization is terminated or revoked.  Performed at [facility], [address] [place], [place] [address]   Culture, blood (routine x 2)     Status: None (Preliminary result)   Collection Time: 09/03/20 12:05 AM   Specimen: BLOOD  Result Value Ref Range Status   Specimen Description BLOOD LEFT ANTECUBITAL  Final   Special Requests   Final    BOTTLES DRAWN AEROBIC AND ANAEROBIC Blood Culture adequate volume   Culture   Final    NO GROWTH 3 DAYS Performed at [facility], [address] [place], [place] [address]    Report Status PENDING  Incomplete  Urine culture     Status: None   Collection Time: 09/03/20 12:05 AM   Specimen: Urine, Random  Result Value Ref Range Status   Specimen Description   Final    URINE, RANDOM Performed at [facility], [address] [place], [place] [address]    Special Requests   Final    NONE Performed at [facility], [address], [place], [place] [address]    Culture   Final    NO GROWTH Performed at [facility], [place] [address], [place], [place] [address]    Report Status  09/04/2020 FINAL  Final  Aerobic/Anaerobic Culture (surgical/deep wound)     Status: None (Preliminary result)   Collection Time: 09/04/20  9:45 AM   Specimen: PATH Cytology Misc. fluid; Body Fluid  Result Value Ref Range Status   Specimen Description   Final    ABSCESS Performed at [facility], [address] [place], [place] [address]    Special Requests   Final    NONE Performed at [facility], [address] [place], [place] [address]    Gram Stain   Final    RARE WBC PRESENT, PREDOMINANTLY PMN RARE GRAM POSITIVE COCCI IN PAIRS    Culture   Final    FEW STREPTOCOCCUS GROUP C RARE STAPHYLOCOCCUS AUREUS SUSCEPTIBILITIES TO FOLLOW Performed at [facility], [place] [address] [place],  [address]    Report Status PENDING  Incomplete     Time coordinating discharge: Over 30 minutes  SIGNED:   Darliss Cheney, MD  [facility] 09/06/2020, 10:07 AM  If 7PM-7AM, please contact night-coverage [email or web address]

## 2020-09-06 NOTE — Progress Notes (Signed)
Arrived via bed from room 211 with RN holding pressure on bilateral groin areas, bleeding a large amount of bright red blood. Dr Dahlia Byes notified and OR notified.

## 2020-09-06 NOTE — Treatment Plan (Signed)
Pt to OR at this time, writer changed dressing prior to transport again. Held pressure as patient was taken to the OR and held in PACU until nurse there took over. Report given to PACU nurse, lab at bedside.

## 2020-09-06 NOTE — Discharge Instructions (Signed)
Hidradenitis Suppurativa Hidradenitis suppurativa is a long-term (chronic) skin disease. It is similar to a severe form of acne, but it affects areas of the body where acne would be unusual, especially areas of the body where skin rubs against skin and becomes moist. These include:  Underarms.  Groin.  Genital area.  Buttocks.  Upper thighs.  Breasts. Hidradenitis suppurativa may start out as small lumps or pimples caused by blocked sweat glands or hair follicles. Pimples may develop into deep sores that break open (rupture) and drain pus. Over time, affected areas of skin may thicken and become scarred. This condition is rare and does not spread from person to person (non-contagious). What are the causes? The exact cause of this condition is not known. It may be related to:  Male and male hormones.  An overactive disease-fighting system (immune system). The immune system may over-react to blocked hair follicles or sweat glands and cause swelling and pus-filled sores. What increases the risk? You are more likely to develop this condition if you:  Are male.  Are 11-55 years old.  Have a family history of hidradenitis suppurativa.  Have a personal history of acne.  Are overweight.  Smoke.  Take the medicine lithium. What are the signs or symptoms? The first symptoms are usually painful bumps in the skin, similar to pimples. The condition may get worse over time (progress), or it may only cause mild symptoms. If the disease progresses, symptoms may include:  Skin bumps getting bigger and growing deeper into the skin.  Bumps rupturing and draining pus.  Itchy, infected skin.  Skin getting thicker and scarred.  Tunnels under the skin (fistulas) where pus drains from a bump.  Pain during daily activities, such as pain during walking if your groin area is affected.  Emotional problems, such as stress or depression. This condition may affect your appearance and your  ability or willingness to wear certain clothes or do certain activities. How is this diagnosed? This condition is diagnosed by a health care provider who specializes in skin diseases (dermatologist). You may be diagnosed based on:  Your symptoms and medical history.  A physical exam.  Testing a pus sample for infection.  Blood tests. How is this treated? Your treatment will depend on how severe your symptoms are. The same treatment will not work for everybody with this condition. You may need to try several treatments to find what works best for you. Treatment may include:  Cleaning and bandaging (dressing) your wounds as needed.  Lifestyle changes, such as new skin care routines.  Taking medicines, such as: ? Antibiotics. ? Acne medicines. ? Medicines to reduce the activity of the immune system. ? A diabetes medicine (metformin). ? Birth control pills, for women. ? Steroids to reduce swelling and pain.  Working with a mental health care provider, if you experience emotional distress due to this condition. If you have severe symptoms that do not get better with medicine, you may need surgery. Surgery may involve:  Using a laser to clear the skin and remove hair follicles.  Opening and draining deep sores.  Removing the areas of skin that are diseased and scarred. Follow these instructions at home: Medicines   Take over-the-counter and prescription medicines only as told by your health care provider.  If you were prescribed an antibiotic medicine, take it as told by your health care provider. Do not stop taking the antibiotic even if your condition improves. Skin care  If you have open wounds, cover   them with a clean dressing as told by your health care provider. Keep wounds clean by washing them gently with soap and water when you bathe.  Do not shave the areas where you get hidradenitis suppurativa.  Do not wear deodorant.  Wear loose-fitting clothes.  Try to avoid  getting overheated or sweaty. If you get sweaty or wet, change into clean, dry clothes as soon as you can.  To help relieve pain and itchiness, cover sore areas with a warm, clean washcloth (warm compress) for 5-10 minutes as often as needed.  If told by your health care provider, take a bleach bath twice a week: ? Fill your bathtub halfway with water. ? Pour in  cup of unscented household bleach. ? Soak in the tub for 5-10 minutes. ? Only soak from the neck down. Avoid water on your face and hair. ? Shower to rinse off the bleach from your skin. General instructions  Learn as much as you can about your disease so that you have an active role in your treatment. Work closely with your health care provider to find treatments that work for you.  If you are overweight, work with your health care provider to lose weight as recommended.  Do not use any products that contain nicotine or tobacco, such as cigarettes and e-cigarettes. If you need help quitting, ask your health care provider.  If you struggle with living with this condition, talk with your health care provider or work with a mental health care provider as recommended.  Keep all follow-up visits as told by your health care provider. This is important. Where to find more information  Hidradenitis Suppurativa Foundation, Inc.: https://www.hs-foundation.org/ Contact a health care provider if you have:  A flare-up of hidradenitis suppurativa.  A fever or chills.  Trouble controlling your symptoms at home.  Trouble doing your daily activities because of your symptoms.  Trouble dealing with emotional problems related to your condition. Summary  Hidradenitis suppurativa is a long-term (chronic) skin disease. It is similar to a severe form of acne, but it affects areas of the body where acne would be unusual.  The first symptoms are usually painful bumps in the skin, similar to pimples. The condition may get worse over time  (progress), or it may only cause mild symptoms.  If you have open wounds, cover them with a clean dressing as told by your health care provider. Keep wounds clean by washing them gently with soap and water when you bathe.  Besides skin care, treatment may include medicines, laser treatment, and surgery. This information is not intended to replace advice given to you by your health care provider. Make sure you discuss any questions you have with your health care provider. Document Revised: 10/02/2017 Document Reviewed: 10/02/2017 Elsevier Patient Education  2020 [employer] Inc.  

## 2020-09-06 NOTE — Treatment Plan (Addendum)
Pt profusely bleeding at this time. Called to room by girlfriend. Upon assessment, patient laying in bed with a pool of blood underneath him and incisions on the belly where primrose drain is and one at top of groin was bleeding copious amounts. Pressure applied. 3 kerlix rolls placed and patient then wrapped with 3 kerlix dressings. Pt cleansed at this time and linens changed. PA Delena Bali made aware via secure message of incident. Pt given dilaudid 0.5mg  at this time. KLB  Update: Order placed for stat H and H. KLB

## 2020-09-06 NOTE — Progress Notes (Signed)
Received a call from nurse that patient is profusely bleeding from incision and drainage site.  Went at the bedside.  Surgical PA was also at the bedside.  They were unable to stop the bleeding.  They plan to take him to the OR later today.  Canceling discharge today.

## 2020-09-06 NOTE — Anesthesia Procedure Notes (Signed)
Procedure Name: Intubation Date/Time: 09/06/2020 2:58 PM Performed by: Lerry Liner, CRNA Pre-anesthesia Checklist: Patient identified, Emergency Drugs available, Suction available and Patient being monitored Patient Re-evaluated:Patient Re-evaluated prior to induction Oxygen Delivery Method: Circle system utilized Preoxygenation: Pre-oxygenation with 100% oxygen Induction Type: Rapid sequence and Cricoid Pressure applied Laryngoscope Size: McGraph and 4 Grade View: Grade II Tube type: Oral Number of attempts: 1 Airway Equipment and Method: Stylet and Oral airway Placement Confirmation: ETT inserted through vocal cords under direct vision,  positive ETCO2 and breath sounds checked- equal and bilateral Secured at: 22 cm Tube secured with: Tape Dental Injury: Teeth and Oropharynx as per pre-operative assessment

## 2020-09-06 NOTE — Treatment Plan (Addendum)
Nurse called to room for perfuse bleeding as patient ambulated to the bathroom, upon arrival, nurse found pool of blood to floor and patient covered. Nurse and nursing student held pressure to site and called Edwyna Ready, [place] and MD Pahwani for assistance (MD states to call surgery team), girlfriend at bedside. Pa ordered medication and awaiting from pharmacy. Pressure continued to be held to prevent further bleeding. Dr. Dahlia Byes came to bedside and held pressure, educated patient on operating room need, and Z.Schultz PA finished education to girlfriend and patient.  Operative consent obtained at this time by Probation officer. Pressure dressing re-inforced as patient is still bleeding at this time. Other wounds packed with iodoform at this time.

## 2020-09-06 NOTE — Anesthesia Preprocedure Evaluation (Signed)
Anesthesia Evaluation  Patient identified by MRN, date of birth, ID band Patient awake    Reviewed: Allergy & Precautions, NPO status , Patient's Chart, lab work & pertinent test results  Airway Mallampati: III       Dental  (+) Poor Dentition   Pulmonary neg pulmonary ROS, Current Smoker and Patient abstained from smoking.,    Pulmonary exam normal breath sounds clear to auscultation       Cardiovascular hypertension, negative cardio ROS   Rhythm:Regular Rate:Normal     Neuro/Psych negative neurological ROS  negative psych ROS   GI/Hepatic negative GI ROS, Neg liver ROS,   Endo/Other  negative endocrine ROSdiabetes  Renal/GU negative Renal ROS  negative genitourinary   Musculoskeletal negative musculoskeletal ROS (+)   Abdominal   Peds negative pediatric ROS (+)  Hematology negative hematology ROS (+)   Anesthesia Other Findings .Marland KitchenPast Medical History: No date: Diabetes mellitus without complication (HCC) No date: Hypertension   Reproductive/Obstetrics negative OB ROS                             Anesthesia Physical Anesthesia Plan  ASA: III and emergent  Anesthesia Plan: General   Post-op Pain Management:    Induction: Intravenous  PONV Risk Score and Plan: 1 and Dexamethasone and Ondansetron  Airway Management Planned: Oral ETT  Additional Equipment:   Intra-op Plan:   Post-operative Plan: Extubation in OR  Informed Consent: I have reviewed the patients History and Physical, chart, labs and discussed the procedure including the risks, benefits and alternatives for the proposed anesthesia with the patient or authorized representative who has indicated his/her understanding and acceptance.     Dental advisory given  Plan Discussed with:   Anesthesia Plan Comments: (RSI)        Anesthesia Quick Evaluation

## 2020-09-06 NOTE — Op Note (Signed)
  09/06/2020  3:39 PM  PATIENT:  Curtis Peterson  44 y.o. male  PRE-OPERATIVE DIAGNOSIS:  Cellulitis, hidradenitis of abdominal wall with active bleeding from suprapubic wound  POST-OPERATIVE DIAGNOSIS:  Same  PROCEDURE:  1. Wound exploration and control of bleeding with cautery 2 Excsional debridement of skin subcutaneous tissue   Measuring 6 square centimeters   FINDINGS: Dermal bleeder from suprapubic wound, pt has total of 9 cavities of different sizes within the abdominal wall.  SURGEON:  Surgeon(s) and Role:    * Aurora Rody F, MD - Primary  ANESTHESIA: GETA  DICTATION:  Patient was explained about the  Procedure in detail, risks, benefits possible complications and a consent was obtained. The patient taken to the operating room and placed in the supine position.  Nurse Aid was holding pressure.  We prepped and draped in the usual sterile fashion and remove all the packing.  I proceeded to explore the wound in the suprapubic wound was oozing.  I had to do and extension of the incision to be able to adequately visualize the raw area.  Excisional debridement of the skin and subq tissue performed with electrocautery.  After increase of the size of the suprapubic wound I was able to see a raw area within the nerve vermis.  There was active bleeding.  There was also big clot and I was able to evacuate.  I was able to visualize the dermal bleeder and cauterized it appropriately with Bovie device.  Attention was then placed to all the other wounds that I inspected them.  I also had to remove the Penrose for appropriate evaluation.  There was no evidence of any active bleeding.  The wounds were irrigated with warm water.  They were packed with half inch packing.  Pressure dressing applied.  No complications      Jules Husbands, MD

## 2020-09-07 ENCOUNTER — Encounter: Payer: Self-pay | Admitting: Surgery

## 2020-09-07 DIAGNOSIS — L03311 Cellulitis of abdominal wall: Principal | ICD-10-CM

## 2020-09-07 DIAGNOSIS — I10 Essential (primary) hypertension: Secondary | ICD-10-CM

## 2020-09-07 DIAGNOSIS — E119 Type 2 diabetes mellitus without complications: Secondary | ICD-10-CM

## 2020-09-07 LAB — CBC
HCT: 34.1 % — ABNORMAL LOW (ref 39.0–52.0)
Hemoglobin: 11.2 g/dL — ABNORMAL LOW (ref 13.0–17.0)
MCH: 33.4 pg (ref 26.0–34.0)
MCHC: 32.8 g/dL (ref 30.0–36.0)
MCV: 101.8 fL — ABNORMAL HIGH (ref 80.0–100.0)
Platelets: 188 10*3/uL (ref 150–400)
RBC: 3.35 MIL/uL — ABNORMAL LOW (ref 4.22–5.81)
RDW: 12.8 % (ref 11.5–15.5)
WBC: 7 10*3/uL (ref 4.0–10.5)
nRBC: 0 % (ref 0.0–0.2)

## 2020-09-07 LAB — GLUCOSE, CAPILLARY: Glucose-Capillary: 106 mg/dL — ABNORMAL HIGH (ref 70–99)

## 2020-09-07 MED ORDER — DOXYCYCLINE MONOHYDRATE 100 MG PO CAPS: 100.0000 mg | ORAL_CAPSULE | Freq: Two times a day (BID) | ORAL | 0 refills | Status: AC

## 2020-09-07 NOTE — Anesthesia Postprocedure Evaluation (Signed)
Anesthesia Post Note  Patient: Curtis Peterson  Procedure(s) Performed: WOUND EXPLORATION (N/A Abdomen)  Patient location during evaluation: PACU Anesthesia Type: General Level of consciousness: awake and alert Pain management: pain level controlled Vital Signs Assessment: post-procedure vital signs reviewed and stable Respiratory status: spontaneous breathing, nonlabored ventilation, respiratory function stable and patient connected to nasal cannula oxygen Cardiovascular status: blood pressure returned to baseline and stable Postop Assessment: no apparent nausea or vomiting Anesthetic complications: no   No complications documented.   Last Vitals:  Vitals:   09/07/20 0426 09/07/20 0758  BP: 117/84 131/87  Pulse: (!) 53 60  Resp: 20 18  Temp: 36.5 C 36.7 C  SpO2: 97% 100%    Last Pain:  Vitals:   09/07/20 0758  TempSrc: Oral  PainSc:                  Arita Miss

## 2020-09-07 NOTE — Progress Notes (Signed)
Discharge instructions given to patient. Patient verbalized understanding. No further questions or concerns at this time.   Curtis Peterson

## 2020-09-07 NOTE — TOC Transition Note (Signed)
Transition of Care [facility]) - CM/SW Discharge Note   Patient Details  Name: Mizraim Harmening MRN: [id number] Date of Birth: [birth year]  [facility]) CM/SW Contact:  Candie Chroman, LCSW Phone Number: 09/07/2020, 11:32 AM   Clinical Narrative: Patient has orders to discharge home today. CSW called [facility] and confirmed they have everything they need. They will call him to schedule initial visit. No further concerns. CSW signing off.    Final next level of care: [place] Barriers to Discharge: Barriers Resolved   Patient Goals and CMS Choice     Choice offered to / list presented to : Patient  Discharge Placement                    Patient and family notified of of transfer: 09/07/20  Discharge Plan and Services     Post Acute Care Choice: [place]: RN [facility] Agency: Other - See comment ([place]) Date [place]: 09/07/20      Social Determinants of Health (SDOH) Interventions     Readmission Risk Interventions No flowsheet data found.

## 2020-09-07 NOTE — Progress Notes (Signed)
[place] [facility] Day(s): 4.   Post op day(s): 1 Day Post-Op.   Interval History:  Patient seen and examined no acute events or new complaints overnight.  Patient reports he is doing okay, he is very frustrated by the events of yesterday Abdominal soreness, but improving No fever, chills Slight drop in Hgb to 11.2 (from 13.3); no further evidence of bleeding this morning Otherwise doing well   Vital signs in last 24 hours: [min-max] current  Temp:  [97 F (36.1 C)-99.4 F (37.4 C)] 97.7 F (36.5 C) (12/01 0426) Pulse Rate:  [53-99] 53 (12/01 0426) Resp:  [12-27] 20 (12/01 0426) BP: (117-150)/(79-105) 117/84 (12/01 0426) SpO2:  [93 %-100 %] 97 % (12/01 0426) Weight:  [105.2 kg] 105.2 kg (11/30 1418)     Height: 5\' 11"  (180.3 cm) Weight: 105.2 kg BMI (Calculated): 32.36   Intake/Output last 2 shifts:  11/30 0701 - 12/01 0700 In: 3360.4 [P.O.:500; I.V.:2412.1; IV Piggyback:448.2] Out: 2613 [Urine:2600; Blood:10]   Physical Exam:  Constitutional: alert, cooperative and no distress  Respiratory: breathing non-labored at rest  Cardiovascular: regular rate and sinus rhythm  Integumentary:Multiple I&D sites to the suprapubic region, wounds contain gauze wicks, some induration but no residual fluctuance or purulent drainage appreciable. Penrose present. There is no further bleeding present.    Labs:  CBC Latest Ref Rng & Units 09/07/2020 09/06/2020 09/05/2020  WBC 4.0 - 10.5 K/uL 7.0 - 9.2  Hemoglobin 13.0 - 17.0 g/dL 11.2(L) 13.3 14.6  Hematocrit 39 - 52 % 34.1(L) 40.3 45.6  Platelets 150 - 400 K/uL 188 - 221   CMP Latest Ref Rng & Units 09/06/2020 09/05/2020 09/04/2020  Glucose 70 - 99 mg/dL 101(H) 159(H) 106(H)  BUN 6 - 20 mg/dL 16 12 8   Creatinine 0.61 - 1.24 mg/dL 0.78 0.68 0.52(L)  Sodium 135 - 145 mmol/L 138 134(L) 137  Potassium 3.5 - 5.1 mmol/L 4.2 4.7 3.9  Chloride 98 - 111 mmol/L 107 106 105  CO2 22 - 32 mmol/L 23  21(L) 25  Calcium 8.9 - 10.3 mg/dL 9.0 9.1 9.1  Total Protein 6.5 - 8.1 g/dL - 7.1 7.3  Total Bilirubin 0.3 - 1.2 mg/dL - 0.6 0.7  Alkaline Phos 38 - 126 U/L - 64 64  AST 15 - 41 U/L - 25 25  ALT 0 - 44 U/L - 33 31     Imaging studies: No new pertinent imaging studies   Assessment/Plan:  44 y.o. male 1 Day Post-Op s/p takeback for dermal bleeder from suprapubic wound following Incision and Drainage of suprapubic hidradenitis abscess x6   - Continue IV Abx (Unasyn); follow up OR Cx; narrow for PO at homeand complete total of 14 days from I&D             - Continue BID dressing changes; packing with gauze, cover with dry gauze, secure with ABD pad and tape. He will need to continue this at home.               - Pain control prn - further management per primary service     - Discharge Planning: Okay for discharge home from surgical standpoint, ABx and wound care as above, okay to follow up in clinic in 1 week to remove penrose drain.    All of the above findings and recommendations were discussed with the patient, and the medical team, and all of patient's questions were answered to his expressed satisfaction.  -- Edison Simon, PA-C  [facility] 09/07/2020, 7:14 AM [phone] M-F: 7am - 4pm

## 2020-09-07 NOTE — TOC Progression Note (Signed)
Transition of Care [facility]) - Progression Note    Patient Details  Name: Reford Olliff MRN: [id number] Date of Birth: Feb 17, 1976  Transition of Care [facility]) CM/SW [place], LCSW Phone Number: 09/07/2020, 9:45 AM  Clinical Narrative:  Faxed home health orders and discharge summary to [facility]. Put on fax cover sheet for them to call once received.   Expected Discharge Plan: [place] Barriers to Discharge: No Barriers Identified  Expected Discharge Plan and Services Expected Discharge Plan: [place] arrangements for the past 2 months: Single Family Home Expected Discharge Date: 09/07/20                                     Social Determinants of Health (SDOH) Interventions    Readmission Risk Interventions No flowsheet data found.

## 2020-09-07 NOTE — Discharge Summary (Signed)
Physician Discharge Summary  Patient ID: Curtis Peterson MRN: [id number] DOB/AGE: [birth year] 44 y.o.  Admit date: 09/02/2020 Discharge date: 09/07/2020  Admission Diagnoses:  Discharge Diagnoses:  Active Problems:   Abdominal wall cellulitis Abdominal wall cellulitis with suprapubic hydradenitis abscess. Essential hypertension Type 2 diabetes controlled.   Discharged Condition: good  Hospital Course:  From Dr. Doristine Bosworth discharge summary 11/30 Brief/Interim Summary: [name] a44 y.o.malewith a known history of diabetes mellitus, hypertension hidradenitis suppurativa, who presented to the emergency room with acute onset ofworsening lower abdominal wall and groin swelling with associated erythema, warmth and tenderness and some groin drainage.He has a history of groin and perineal hidradenitis suppurativa status post surgical excision.  Upon presentation to the emergency room, vital signs were within normal. Labs revealed unremarkable CMP and CBC showed macrocytosis.Tested negative for COVID-19.Blood cultures were drawn. Abdominal pelvic CT scan revealed the following: 1. Moderate severity cellulitis along the anterior aspect of the lower pelvic wall and groin. 2. Associated subcutaneous nodular appearing areas which may represent small abscesses. 3. Small right adrenal myelolipoma.  Patient was admitted to hospitalist service. General surgery was consulted. Broad-spectrum antibiotics were started. Patient underwent incision and drainage of suprapubic hidradenitis abscesses x6 on 09/04/2020.  Dressing was changed.  He was continued on antibiotics.  His wound culture is growing gram-positive cocci in pairs, final sensitivities and cultures are still pending.  He was seen by general surgery.  He has been cleared for discharge and they have recommended Augmentin for total of 12 more days.  As mentioned above, he was found to have some oozing from surgical site this  morning which was stopped by surgical PA.  Patient not comfortable doing his own dressing and so is his girlfriend.  Plan is that her girlfriend will be educated this morning to do the dressing changes and TOC is working on doing some arrangement for home health wound care for him.  He will be seen by general surgery PA 1 more time this afternoon and if no bleeding, he will be cleared for discharge.  Once all that is arranged, he will be discharged home today on oral antibiotics and have prescribed him 15 tablets of Percocet as well.  12/1.  Patient had wound bleeding on 11/30 before discharge, was taken to the OR by general surgery, no additional bleeding today.  Examined the patient today, condition still stable.  Examined patient wound with surgical PA, no additional bleeding.  Infection syncope much better. Wound culture positive for Streptococcus group C, rare staph aureus.  Added doxycycline for 10 days in addition to Augmentin. Patient has already scheduled to see his PCP and general surgery in the near future.   Consults: general surgery  Significant Diagnostic Studies: CT ABDOMEN AND PELVIS WITH CONTRAST  TECHNIQUE: Multidetector CT imaging of the abdomen and pelvis was performed using the standard protocol following bolus administration of intravenous contrast.  CONTRAST:  173mL OMNIPAQUE IOHEXOL 300 MG/ML  SOLN  COMPARISON:  None.  FINDINGS: Lower chest: No acute abnormality.  Hepatobiliary: There is mild diffuse fatty infiltration of the liver parenchyma. No focal liver abnormality is seen. No gallstones, gallbladder wall thickening, or biliary dilatation.  Pancreas: Unremarkable. No pancreatic ductal dilatation or surrounding inflammatory changes.  Spleen: Normal in size without focal abnormality.  Adrenals/Urinary Tract: The left adrenal gland is normal in appearance. A 1.4 cm fat density right adrenal mass is present. Kidneys are normal, without renal  calculi, focal lesion, or hydronephrosis. Bladder is unremarkable.  Stomach/Bowel: Stomach is within normal  limits. Appendix appears normal. No evidence of bowel wall thickening, distention, or inflammatory changes.  Vascular/Lymphatic: No significant vascular findings are present. No enlarged abdominal or pelvic lymph nodes.  Reproductive: Prostate is unremarkable.  Other: A moderate amount of subcutaneous inflammatory fat stranding is seen along the anterior aspect of the lower pelvic wall, to the left of midline. This extends inferiorly to the level of the groin. Diffuse anterior pelvic wall cutaneous thickening is also seen within this region with 1.4 cm x 1.6 cm, 2.3 cm x 0.9 cm and 2.6 cm x 0.8 cm subcutaneous nodular appearing areas of increased attenuation (axial CT images 81 through 100, CT series number 2).  No abdominopelvic ascites.  Musculoskeletal: No acute or significant osseous findings.  IMPRESSION: 1. Moderate severity cellulitis along the anterior aspect of the lower pelvic wall and groin. 2. Associated subcutaneous nodular appearing areas which may represent small abscesses. 3. Small right adrenal myelolipoma.   Electronically Signed   By: Virgina Norfolk M.D.   On: 09/03/2020 00:03   Treatments: IV antibiotics  Discharge Exam: Blood pressure 131/87, pulse 60, temperature 98 F (36.7 C), temperature source Oral, resp. rate 18, height 5\' 11"  (1.803 m), weight 105.2 kg, SpO2 100 %. General appearance: alert and cooperative Resp: clear to auscultation bilaterally Cardio: regular rate and rhythm, S1, S2 normal, no murmur, click, rub or gallop GI: Lower abdominal wall ulceration, no bleeding Extremities: extremities normal, atraumatic, no cyanosis or edema  Disposition: Discharge disposition: 01-Home or Self Care       Discharge Instructions    Diet - low sodium heart healthy   Complete by: As directed    Discharge wound care:    Complete by: As directed    Followed by [facility]   Increase activity slowly   Complete by: As directed      Allergies as of 09/07/2020   No Known Allergies     Medication List    TAKE these medications   acetaminophen 325 MG tablet Commonly known as: TYLENOL Take 650 mg by mouth every 6 (six) hours as needed for moderate pain.   amoxicillin-clavulanate 875-125 MG tablet Commonly known as: Augmentin Take 1 tablet by mouth 2 (two) times daily for 12 days.   lisinopril 2.5 MG tablet Commonly known as: ZESTRIL Take 2.5 mg by mouth daily.   oxyCODONE-acetaminophen 7.5-325 MG tablet Commonly known as: Percocet Take 1 tablet by mouth every 8 (eight) hours as needed for up to 5 days for severe pain.   valACYclovir 500 MG tablet Commonly known as: VALTREX Take 500 mg by mouth daily.            Discharge Care Instructions  (From admission, onward)         Start     Ordered   09/07/20 0000  Discharge wound care:       Comments: Followed by [facility]   09/07/20 0917          Follow-up Information    Tracie Harrier, MD Follow up in 1 week(s).   Specialty: Internal Medicine Contact information: [place] [address] [address] [facility] up.   Why: They will follow up with you for your wound care needs. Contact information: [phone]       Tylene Fantasia, PA-C. Schedule an appointment as soon as possible for a visit in 1 week(s).   Specialty: Physician Assistant Why: s/p I&d has penrose  drain Contact information: [address] [place] [address] [phone]               Signed: Sharen Hones 09/07/2020, 9:17 AM

## 2020-09-08 ENCOUNTER — Ambulatory Visit (INDEPENDENT_AMBULATORY_CARE_PROVIDER_SITE_OTHER): Payer: 59 | Admitting: Dermatology

## 2020-09-08 ENCOUNTER — Other Ambulatory Visit: Payer: Self-pay

## 2020-09-08 DIAGNOSIS — L709 Acne, unspecified: Secondary | ICD-10-CM

## 2020-09-08 DIAGNOSIS — L732 Hidradenitis suppurativa: Secondary | ICD-10-CM | POA: Diagnosis not present

## 2020-09-08 DIAGNOSIS — L739 Follicular disorder, unspecified: Secondary | ICD-10-CM | POA: Diagnosis not present

## 2020-09-08 DIAGNOSIS — L0292 Furuncle, unspecified: Secondary | ICD-10-CM | POA: Diagnosis not present

## 2020-09-08 LAB — CULTURE, BLOOD (ROUTINE X 2)
Culture: NO GROWTH
Culture: NO GROWTH
Special Requests: ADEQUATE
Special Requests: ADEQUATE

## 2020-09-08 MED ORDER — ISOTRETINOIN 20 MG PO CAPS: ORAL_CAPSULE | ORAL | 0 refills | Status: DC

## 2020-09-08 NOTE — Progress Notes (Signed)
   Follow-Up Visit   Subjective  Curtis Peterson is a 44 y.o. male who presents for the following: HS (patient has insurance again and would like to restart Isotretinoin). Patient state that he was totally clear while on Isotretinoin treatment and found it very helpful in controlling his HS.   The following portions of the chart were reviewed this encounter and updated as appropriate:  Tobacco  Allergies  Meds  Problems  Med Hx  Surg Hx  Fam Hx     Review of Systems:  No other skin or systemic complaints except as noted in HPI or Assessment and Plan.  Objective  Well appearing patient in no apparent distress; mood and affect are within normal limits.  A focused examination was performed including the scalp . Relevant physical exam findings are noted in the Assessment and Plan.   Assessment & Plan  Hidradenitis suppurativa Scalp, axilla, groin  With folliculitis, acne, and furunculosis - Condition improved while on Isotretinoin After finishing 10 day course of Doxycycline (for recent surgery), restart Isotretinoin 20mg  po QD. #30 0RF. While taking isotretinoin, do not share pills and do not donate blood. Isotretinoin is best absorbed when taken with a fatty meal. Isotretinoin can make you sensitive to the sun. Daily careful sun protection including sunscreen SPF 30+ when outdoors is recommended.   Patient re-registered in [place] #[id number]  Hidradenitis Suppurativa is a chronic condition due to abnormal inflamed sweat glands in the body folds (axilla, inframammary, groin, medial thighs), causing recurrent painful cysts and scarring. Goal is control and prevention of flares, not cure. Scars are permanent and can be thickened. Treatment may include daily use of topical and oral antibiotics to decrease inflammation.  Oral isotretinoin may also be helpful, but this generally is not given long term.  For more severe cases, Humira (a biologic injection) may be prescribed to decrease the  inflammatory process and prevent flares.  When indicated, inflamed cysts may also be treated surgically with incision and drainage, and/or surgical removal.  ISOtretinoin (ACCUTANE) 20 MG capsule - Scalp, axilla, groin  Return in about 1 month (around 10/09/2020) for Isotretinoin follow up .  Luther Redo, CMA, am acting as scribe for Sarina Ser, MD .  Documentation: I have reviewed the above documentation for accuracy and completeness, and I agree with the above.  Sarina Ser, MD

## 2020-09-09 LAB — AEROBIC/ANAEROBIC CULTURE W GRAM STAIN (SURGICAL/DEEP WOUND)

## 2020-09-12 NOTE — Progress Notes (Unsigned)
09/13/2020 1:47 PM   Curtis Peterson 01/18/1976 [id number]  Referring provider: Tracie Harrier, [facility],  [place] [address]  No chief complaint on file.   HPI: Curtis Peterson is a 44 year old male with a history of hidradenitis with several I & D of perineal abscess and STI who presents today for follow up.   He was seen by Dr. Hampton Abbot yesterday for symptoms of two weeks of swelling of his foreskin limiting the ability to retract the foreskin past the glans.  He was started on Bactrim DS and referred to [place].    He stated he started to have penile tenderness that started 2 weeks ago.  He states that he has increasingly been unable to pull back the foreskin to aid in urination and cleaning the head of his penis.  He has noted some blood on his underwear, but he denies any purulent discharge.  He is sexually active, but he states he is a monogamous relationship with his girlfriend.  Patient denies any gross hematuria, dysuria or suprapubic/flank pain.  Patient denies any fevers, chills, nausea or vomiting.  He is a diet-controlled diabetic.  His UA is orange cloudy, trace blood, 2+ protein, 1+ leukocytes and 6-10 WBCs, 0-2 RBCs, 0-10 epithelial cells, and few bacteria.  His PVR is 0 mL.    PMH: Past Medical History:  Diagnosis Date  . Diabetes mellitus without complication ([place])   . Hypertension     Surgical History: Past Surgical History:  Procedure Laterality Date  . head lesions removed  2015  . HYDRADENITIS EXCISION N/A 02/26/2020   Procedure: EXCISION HIDRADENITIS, Perineum;  Surgeon: Jules Husbands, MD;  Location: [facility] ORS;  Service: General;  Laterality: N/A;  . INCISION AND DRAINAGE ABSCESS N/A 09/04/2020   Procedure: INCISION AND DRAINAGE SKIN ABSCESS;  Surgeon: Olean Ree, MD;  Location: [facility] ORS;  Service: General;  Laterality: N/A;  Suprapubic area  . INCISION AND DRAINAGE PERIRECTAL ABSCESS N/A 06/30/2019   Procedure:  IRRIGATION AND DEBRIDEMENT PERIRECTAL ABSCESS;  Surgeon: Jules Husbands, MD;  Location: [facility] ORS;  Service: General;  Laterality: N/A;  . WOUND EXPLORATION N/A 09/06/2020   Procedure: WOUND EXPLORATION;  Surgeon: Jules Husbands, MD;  Location: [facility] ORS;  Service: General;  Laterality: N/A;    Home Medications:  Allergies as of 09/13/2020   No Known Allergies     Medication List       Accurate as of September 12, 2020  1:47 PM. If you have any questions, ask your nurse or doctor.        acetaminophen 325 MG tablet Commonly known as: TYLENOL Take 650 mg by mouth every 6 (six) hours as needed for moderate pain.   amoxicillin-clavulanate 875-125 MG tablet Commonly known as: Augmentin Take 1 tablet by mouth 2 (two) times daily for 12 days.   doxycycline 100 MG capsule Commonly known as: MONODOX Take 1 capsule (100 mg total) by mouth 2 (two) times daily for 10 days.   ISOtretinoin 20 MG capsule Commonly known as: ACCUTANE Take one tab po QD with a meal.   lisinopril 2.5 MG tablet Commonly known as: ZESTRIL Take 2.5 mg by mouth daily.   valACYclovir 500 MG tablet Commonly known as: VALTREX Take 500 mg by mouth daily.       Allergies: No Known Allergies  Family History: Family History  Problem Relation Age of Onset  . Hypertension Other   . Diabetes Mother   .  Diabetes Father     Social History:  reports that he has been smoking cigarettes. He has been smoking about 0.50 packs per day. He has never used smokeless tobacco. He reports that he does not drink alcohol and does not use drugs.  ROS: For pertinent review of systems please refer to history of present illness  Physical Exam: There were no vitals taken for this visit.  Constitutional:  Well nourished. Alert and oriented, No acute distress. HEENT: [place] AT, moist mucus membranes.  Trachea midline Cardiovascular: No clubbing, cyanosis, or edema. Respiratory: Normal respiratory effort, no increased work of  breathing. GI: Abdomen is soft, non tender, non distended, no abdominal masses. Liver and spleen not palpable.  No hernias appreciated.  Stool sample for occult testing is not indicated.   GU: No CVA tenderness.  No bladder fullness or masses.  Patient with circumcised/uncircumcised phallus. ***Foreskin easily retracted***  Urethral meatus is patent.  No penile discharge. No penile lesions or rashes. Scrotum without lesions, cysts, rashes and/or edema.  Testicles are located scrotally bilaterally. No masses are appreciated in the testicles. Left and right epididymis are normal. Rectal: Patient with  normal sphincter tone. Anus and perineum without scarring or rashes. No rectal masses are appreciated. Prostate is approximately *** grams, *** nodules are appreciated. Seminal vesicles are normal. Skin: No rashes, bruises or suspicious lesions. Lymph: No inguinal adenopathy. Neurologic: Grossly intact, no focal deficits, moving all 4 extremities. Psychiatric: Normal mood and affect.  Laboratory Data: Results for orders placed or performed during the hospital encounter of 09/02/20  Resp Panel by RT-PCR (Flu A&B, Covid) Nasopharyngeal Swab   Specimen: Nasopharyngeal Swab; Nasopharyngeal(NP) swabs in vial transport medium  Result Value Ref Range   SARS Coronavirus 2 by RT PCR NEGATIVE NEGATIVE   Influenza A by PCR NEGATIVE NEGATIVE   Influenza B by PCR NEGATIVE NEGATIVE  Culture, blood (routine x 2)   Specimen: BLOOD  Result Value Ref Range   Specimen Description BLOOD BLOOD RIGHT FOREARM    Special Requests      BOTTLES DRAWN AEROBIC AND ANAEROBIC Blood Culture adequate volume   Culture      NO GROWTH 5 DAYS Performed at [facility], [address] [place], [place] [address]    Report Status 09/08/2020 FINAL   Culture, blood (routine x 2)   Specimen: BLOOD  Result Value Ref Range   Specimen Description BLOOD LEFT ANTECUBITAL    Special Requests      BOTTLES DRAWN AEROBIC AND  ANAEROBIC Blood Culture adequate volume   Culture      NO GROWTH 5 DAYS Performed at [facility], [address], [place], [place] [address]    Report Status 09/08/2020 FINAL   Urine culture   Specimen: Urine, Random  Result Value Ref Range   Specimen Description      URINE, RANDOM Performed at [facility], [address] [place], [place] [address]    Special Requests      NONE Performed at [facility], [address] [place], [place] [address]    Culture      NO GROWTH Performed at [facility], [place] [address], [place], [place] [address]    Report Status 09/04/2020 FINAL   Aerobic/Anaerobic Culture (surgical/deep wound)   Specimen: PATH Cytology Misc. fluid; Body Fluid  Result Value Ref Range   Specimen Description      ABSCESS Performed at [facility], [address] [place], [place] [address]  Special Requests      NONE Performed at [facility], [place], [place] [address]    Gram Stain      RARE WBC PRESENT, PREDOMINANTLY PMN RARE GRAM POSITIVE COCCI IN PAIRS    Culture      FEW STREPTOCOCCUS GROUP C RARE STAPHYLOCOCCUS AUREUS Beta hemolytic streptococci are predictably susceptible to penicillin and other beta lactams. Susceptibility testing not routinely performed. NO ANAEROBES ISOLATED Performed at [facility], [place] [address], [place], [place] [address]    Report Status 09/09/2020 FINAL    Organism ID, Bacteria STAPHYLOCOCCUS AUREUS       Susceptibility   Staphylococcus aureus - MIC*    CIPROFLOXACIN <=0.5 SENSITIVE Sensitive     ERYTHROMYCIN <=0.25 SENSITIVE Sensitive     GENTAMICIN <=0.5 SENSITIVE Sensitive     OXACILLIN <=0.25 SENSITIVE Sensitive     TETRACYCLINE <=1 SENSITIVE Sensitive     VANCOMYCIN <=0.5 SENSITIVE Sensitive     TRIMETH/SULFA <=10 SENSITIVE Sensitive     CLINDAMYCIN <=0.25 SENSITIVE Sensitive     RIFAMPIN <=0.5 SENSITIVE Sensitive     Inducible  Clindamycin NEGATIVE Sensitive     * RARE STAPHYLOCOCCUS AUREUS  Lactic acid, plasma  Result Value Ref Range   Lactic Acid, Venous 1.7 0.5 - 1.9 mmol/L  Comprehensive metabolic panel  Result Value Ref Range   Sodium 136 135 - 145 mmol/L   Potassium 4.0 3.5 - 5.1 mmol/L   Chloride 103 98 - 111 mmol/L   CO2 23 22 - 32 mmol/L   Glucose, Bld 125 (H) 70 - 99 mg/dL   BUN 11 6 - 20 mg/dL   Creatinine, Ser 0.66 0.61 - 1.24 mg/dL   Calcium 9.5 8.9 - 10.3 mg/dL   Total Protein 8.8 (H) 6.5 - 8.1 g/dL   Albumin 3.3 (L) 3.5 - 5.0 g/dL   AST 37 15 - 41 U/L   ALT 42 0 - 44 U/L   Alkaline Phosphatase 83 38 - 126 U/L   Total Bilirubin 0.5 0.3 - 1.2 mg/dL   GFR, Estimated >60 >60 mL/min   Anion gap 10 5 - 15  CBC with Differential  Result Value Ref Range   WBC 9.2 4.0 - 10.5 K/uL   RBC 5.04 4.22 - 5.81 MIL/uL   Hemoglobin 16.7 13.0 - 17.0 g/dL   HCT 50.7 39 - 52 %   MCV 100.6 (H) 80.0 - 100.0 fL   MCH 33.1 26.0 - 34.0 pg   MCHC 32.9 30.0 - 36.0 g/dL   RDW 13.2 11.5 - 15.5 %   Platelets 296 150 - 400 K/uL   nRBC 0.0 0.0 - 0.2 %   Neutrophils Relative % 59 %   Neutro Abs 5.4 1.7 - 7.7 K/uL   Lymphocytes Relative 32 %   Lymphs Abs 2.9 0.7 - 4.0 K/uL   Monocytes Relative 6 %   Monocytes Absolute 0.5 0.1 - 1.0 K/uL   Eosinophils Relative 2 %   Eosinophils Absolute 0.2 0.0 - 0.5 K/uL   Basophils Relative 1 %   Basophils Absolute 0.1 0.0 - 0.1 K/uL   Immature Granulocytes 0 %   Abs Immature Granulocytes 0.02 0.00 - 0.07 K/uL  Urinalysis, Complete w Microscopic  Result Value Ref Range   Color, Urine YELLOW (A) YELLOW   APPearance CLEAR (A) CLEAR   Specific Gravity, Urine 1.031 (H) 1.005 - 1.030   pH 5.0 5.0 - 8.0   Glucose, UA NEGATIVE NEGATIVE mg/dL  Hgb urine dipstick SMALL (A) NEGATIVE   Bilirubin Urine NEGATIVE NEGATIVE   Ketones, ur NEGATIVE NEGATIVE mg/dL   Protein, ur 30 (A) NEGATIVE mg/dL   Nitrite NEGATIVE NEGATIVE   Leukocytes,Ua NEGATIVE NEGATIVE   WBC, UA NONE SEEN 0 -  5 WBC/hpf   Bacteria, UA NONE SEEN NONE SEEN   Squamous Epithelial / LPF NONE SEEN 0 - 5   Mucus PRESENT   HIV Antibody (routine testing w rflx)  Result Value Ref Range   HIV Screen 4th Generation wRfx Non Reactive Non Reactive  Basic metabolic panel  Result Value Ref Range   Sodium 136 135 - 145 mmol/L   Potassium 4.0 3.5 - 5.1 mmol/L   Chloride 106 98 - 111 mmol/L   CO2 23 22 - 32 mmol/L   Glucose, Bld 110 (H) 70 - 99 mg/dL   BUN 9 6 - 20 mg/dL   Creatinine, Ser 0.64 0.61 - 1.24 mg/dL   Calcium 8.9 8.9 - 10.3 mg/dL   GFR, Estimated >60 >60 mL/min   Anion gap 7 5 - 15  CBC  Result Value Ref Range   WBC 8.4 4.0 - 10.5 K/uL   RBC 4.62 4.22 - 5.81 MIL/uL   Hemoglobin 15.3 13.0 - 17.0 g/dL   HCT 46.5 39 - 52 %   MCV 100.6 (H) 80.0 - 100.0 fL   MCH 33.1 26.0 - 34.0 pg   MCHC 32.9 30.0 - 36.0 g/dL   RDW 13.3 11.5 - 15.5 %   Platelets 255 150 - 400 K/uL   nRBC 0.4 (H) 0.0 - 0.2 %  Hemoglobin A1c  Result Value Ref Range   Hgb A1c MFr Bld 6.0 (H) 4.8 - 5.6 %   Mean Plasma Glucose 125.5 mg/dL  CBC  Result Value Ref Range   WBC 6.2 4.0 - 10.5 K/uL   RBC 4.49 4.22 - 5.81 MIL/uL   Hemoglobin 14.9 13.0 - 17.0 g/dL   HCT 45.4 39 - 52 %   MCV 101.1 (H) 80.0 - 100.0 fL   MCH 33.2 26.0 - 34.0 pg   MCHC 32.8 30.0 - 36.0 g/dL   RDW 13.1 11.5 - 15.5 %   Platelets 217 150 - 400 K/uL   nRBC 0.0 0.0 - 0.2 %  Comprehensive metabolic panel  Result Value Ref Range   Sodium 137 135 - 145 mmol/L   Potassium 3.9 3.5 - 5.1 mmol/L   Chloride 105 98 - 111 mmol/L   CO2 25 22 - 32 mmol/L   Glucose, Bld 106 (H) 70 - 99 mg/dL   BUN 8 6 - 20 mg/dL   Creatinine, Ser 0.52 (L) 0.61 - 1.24 mg/dL   Calcium 9.1 8.9 - 10.3 mg/dL   Total Protein 7.3 6.5 - 8.1 g/dL   Albumin 2.8 (L) 3.5 - 5.0 g/dL   AST 25 15 - 41 U/L   ALT 31 0 - 44 U/L   Alkaline Phosphatase 64 38 - 126 U/L   Total Bilirubin 0.7 0.3 - 1.2 mg/dL   GFR, Estimated >60 >60 mL/min   Anion gap 7 5 - 15  Glucose, capillary  Result  Value Ref Range   Glucose-Capillary 86 70 - 99 mg/dL  Glucose, capillary  Result Value Ref Range   Glucose-Capillary 102 (H) 70 - 99 mg/dL  Glucose, capillary  Result Value Ref Range   Glucose-Capillary 157 (H) 70 - 99 mg/dL  Glucose, capillary  Result Value Ref Range   Glucose-Capillary 141 (H) 70 -  99 mg/dL  Glucose, capillary  Result Value Ref Range   Glucose-Capillary 200 (H) 70 - 99 mg/dL  CBC  Result Value Ref Range   WBC 9.2 4.0 - 10.5 K/uL   RBC 4.43 4.22 - 5.81 MIL/uL   Hemoglobin 14.6 13.0 - 17.0 g/dL   HCT 45.6 39 - 52 %   MCV 102.9 (H) 80.0 - 100.0 fL   MCH 33.0 26.0 - 34.0 pg   MCHC 32.0 30.0 - 36.0 g/dL   RDW 12.7 11.5 - 15.5 %   Platelets 221 150 - 400 K/uL   nRBC 0.0 0.0 - 0.2 %  Comprehensive metabolic panel  Result Value Ref Range   Sodium 134 (L) 135 - 145 mmol/L   Potassium 4.7 3.5 - 5.1 mmol/L   Chloride 106 98 - 111 mmol/L   CO2 21 (L) 22 - 32 mmol/L   Glucose, Bld 159 (H) 70 - 99 mg/dL   BUN 12 6 - 20 mg/dL   Creatinine, Ser 0.68 0.61 - 1.24 mg/dL   Calcium 9.1 8.9 - 10.3 mg/dL   Total Protein 7.1 6.5 - 8.1 g/dL   Albumin 2.7 (L) 3.5 - 5.0 g/dL   AST 25 15 - 41 U/L   ALT 33 0 - 44 U/L   Alkaline Phosphatase 64 38 - 126 U/L   Total Bilirubin 0.6 0.3 - 1.2 mg/dL   GFR, Estimated >60 >60 mL/min   Anion gap 7 5 - 15  Glucose, capillary  Result Value Ref Range   Glucose-Capillary 182 (H) 70 - 99 mg/dL  Glucose, capillary  Result Value Ref Range   Glucose-Capillary 119 (H) 70 - 99 mg/dL  Glucose, capillary  Result Value Ref Range   Glucose-Capillary 116 (H) 70 - 99 mg/dL  Glucose, capillary  Result Value Ref Range   Glucose-Capillary 107 (H) 70 - 99 mg/dL  Basic metabolic panel  Result Value Ref Range   Sodium 138 135 - 145 mmol/L   Potassium 4.2 3.5 - 5.1 mmol/L   Chloride 107 98 - 111 mmol/L   CO2 23 22 - 32 mmol/L   Glucose, Bld 101 (H) 70 - 99 mg/dL   BUN 16 6 - 20 mg/dL   Creatinine, Ser 0.78 0.61 - 1.24 mg/dL   Calcium 9.0 8.9 -  10.3 mg/dL   GFR, Estimated >60 >60 mL/min   Anion gap 8 5 - 15  Glucose, capillary  Result Value Ref Range   Glucose-Capillary 94 70 - 99 mg/dL  Glucose, capillary  Result Value Ref Range   Glucose-Capillary 100 (H) 70 - 99 mg/dL  Glucose, capillary  Result Value Ref Range   Glucose-Capillary 76 70 - 99 mg/dL  Hemoglobin and hematocrit, blood  Result Value Ref Range   Hemoglobin 13.3 13.0 - 17.0 g/dL   HCT 40.3 39 - 52 %  Glucose, capillary  Result Value Ref Range   Glucose-Capillary 110 (H) 70 - 99 mg/dL  Glucose, capillary  Result Value Ref Range   Glucose-Capillary 114 (H) 70 - 99 mg/dL  CBC  Result Value Ref Range   WBC 7.0 4.0 - 10.5 K/uL   RBC 3.35 (L) 4.22 - 5.81 MIL/uL   Hemoglobin 11.2 (L) 13.0 - 17.0 g/dL   HCT 34.1 (L) 39 - 52 %   MCV 101.8 (H) 80.0 - 100.0 fL   MCH 33.4 26.0 - 34.0 pg   MCHC 32.8 30.0 - 36.0 g/dL   RDW 12.8 11.5 - 15.5 %   Platelets 188  150 - 400 K/uL   nRBC 0.0 0.0 - 0.2 %  Glucose, capillary  Result Value Ref Range   Glucose-Capillary 161 (H) 70 - 99 mg/dL  Glucose, capillary  Result Value Ref Range   Glucose-Capillary 106 (H) 70 - 99 mg/dL  CBG monitoring, ED  Result Value Ref Range   Glucose-Capillary 86 70 - 99 mg/dL  CBG monitoring, ED  Result Value Ref Range   Glucose-Capillary 135 (H) 70 - 99 mg/dL  Type and screen  Result Value Ref Range   ABO/RH(D) O POS    Antibody Screen NEG    Sample Expiration      09/09/2020,2359 Performed at [facility], [address] [place], [place] [address]    Lab Results  Component Value Date   WBC 7.0 09/07/2020   HGB 11.2 (L) 09/07/2020   HCT 34.1 (L) 09/07/2020   MCV 101.8 (H) 09/07/2020   PLT 188 09/07/2020    Lab Results  Component Value Date   CREATININE 0.78 09/06/2020    Lab Results  Component Value Date   HGBA1C 6.0 (H) 09/03/2020    Lab Results  Component Value Date   TSH 2.052 06/30/2019    Lab Results  Component Value Date   AST 25 09/05/2020    Lab Results  Component Value Date   ALT 33 09/05/2020   I have reviewed the labs.   Pertinent Imaging: Results for Curtis Peterson, Curtis Peterson (MRN [id number]) as of 10/20/2019 12:53  Ref. Range 10/20/2019 11:19  Scan Result Unknown 0    Assessment & Plan:    1. 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  These notes generated with voice recognition software. I apologize for typographical errors.  Zara Council, PA-C  [facility] [address]  [place], [place] [address] [phone]

## 2020-09-13 ENCOUNTER — Ambulatory Visit (INDEPENDENT_AMBULATORY_CARE_PROVIDER_SITE_OTHER): Payer: 59 | Admitting: Physician Assistant

## 2020-09-13 ENCOUNTER — Encounter: Payer: Self-pay | Admitting: Physician Assistant

## 2020-09-13 ENCOUNTER — Encounter: Payer: Self-pay | Admitting: Urology

## 2020-09-13 ENCOUNTER — Ambulatory Visit: Payer: 59 | Admitting: Urology

## 2020-09-13 ENCOUNTER — Other Ambulatory Visit: Payer: Self-pay

## 2020-09-13 VITALS — BP 149/95 | HR 79 | Temp 98.1°F | Ht 71.0 in | Wt 231.4 lb

## 2020-09-13 DIAGNOSIS — L732 Hidradenitis suppurativa: Secondary | ICD-10-CM

## 2020-09-13 DIAGNOSIS — A6002 Herpesviral infection of other male genital organs: Secondary | ICD-10-CM

## 2020-09-13 DIAGNOSIS — Z09 Encounter for follow-up examination after completed treatment for conditions other than malignant neoplasm: Secondary | ICD-10-CM

## 2020-09-13 NOTE — Progress Notes (Signed)
[facility] POST-OP OFFICE VISIT  09/13/2020  HPI: Curtis Peterson is a 44 y.o. male 9 days s/p incision and drainage of suprapubic hidradenitis abscess x6 for suprapubic hidradenitis abscesses which unfortunately required takeback the following day to control bleeding.   He is doing well today The pain is slowly getting better, but still having intermittent exacerbations No fever, chills He is doing well with dressing changes at home  Cx with pan-sensitive staph aureus   Vital signs: BP (!) 149/95   Pulse 79   Temp 98.1 F (36.7 C) (Oral)   Ht 5\' 11"  (1.803 m)   Wt 231 lb 6.4 oz (105 kg)   SpO2 98%   BMI 32.27 kg/m    Physical Exam: Constitutional: Well appearing male, NAD Skin: Multiple I&D sites to the suprapubic region, wounds contain iodoform gauze, some induration but no residual fluctuance or purulent drainage appreciable. Penrose present (I removed this).   Assessment/Plan: This is a 44 y.o. male 9 days s/p incision and drainage of suprapubic hidradenitis abscess x6 for suprapubic hidradenitis abscesses which unfortunately required takeback the following day to control bleeding.    - I removed penrose drain this morning  - I preformed dressing change. He will continue to do this daily. Reviewed wound care instructions  - Complete ABx  - RTC in 1 week for re-check   -- Edison Simon, PA-C [facility] 09/13/2020, 12:03 PM [phone] M-F: 7am - 4pm

## 2020-09-13 NOTE — Patient Instructions (Addendum)
Curtis Peterson will prescribe prescription for itching of the wound to patient's local pharmacy at today's visit. Patient advise to continue with timely dressing changes. Patient may resume with showering. Patient will follow up in one week with Curtis Ket, PA.  Wound Care, Adult Taking care of your wound properly can help to prevent pain, infection, and scarring. It can also help your wound to heal more quickly. How to care for your wound Wound care      Follow instructions from your health care provider about how to take care of your wound. Make sure you: ? Wash your hands with soap and water before you change the bandage (dressing). If soap and water are not available, use hand sanitizer. ? Change your dressing as told by your health care provider. ? Leave stitches (sutures), skin glue, or adhesive strips in place. These skin closures may need to stay in place for 2 weeks or longer. If adhesive strip edges start to loosen and curl up, you may trim the loose edges. Do not remove adhesive strips completely unless your health care provider tells you to do that.  Check your wound area every day for signs of infection. Check for: ? Redness, swelling, or pain. ? Fluid or blood. ? Warmth. ? Pus or a bad smell.  Ask your health care provider if you should clean the wound with mild soap and water. Doing this may include: ? Using a clean towel to pat the wound dry after cleaning it. Do not rub or scrub the wound. ? Applying a cream or ointment. Do this only as told by your health care provider. ? Covering the incision with a clean dressing.  Ask your health care provider when you can leave the wound uncovered.  Keep the dressing dry until your health care provider says it can be removed. Do not take baths, swim, use a hot tub, or do anything that would put the wound underwater until your health care provider approves. Ask your health care provider if you can take showers. You may only be allowed to  take sponge baths. Medicines   If you were prescribed an antibiotic medicine, cream, or ointment, take or use the antibiotic as told by your health care provider. Do not stop taking or using the antibiotic even if your condition improves.  Take over-the-counter and prescription medicines only as told by your health care provider. If you were prescribed pain medicine, take it 30 or more minutes before you do any wound care or as told by your health care provider. General instructions  Return to your normal activities as told by your health care provider. Ask your health care provider what activities are safe.  Do not scratch or pick at the wound.  Do not use any products that contain nicotine or tobacco, such as cigarettes and e-cigarettes. These may delay wound healing. If you need help quitting, ask your health care provider.  Keep all follow-up visits as told by your health care provider. This is important.  Eat a diet that includes protein, vitamin A, vitamin C, and other nutrient-rich foods to help the wound heal. ? Foods rich in protein include meat, dairy, beans, nuts, and other sources. ? Foods rich in vitamin A include carrots and dark green, leafy vegetables. ? Foods rich in vitamin C include citrus, tomatoes, and other fruits and vegetables. ? Nutrient-rich foods have protein, carbohydrates, fat, vitamins, or minerals. Eat a variety of healthy foods including vegetables, fruits, and whole grains. Contact a health  care provider if:  You received a tetanus shot and you have swelling, severe pain, redness, or bleeding at the injection site.  Your pain is not controlled with medicine.  You have redness, swelling, or pain around the wound.  You have fluid or blood coming from the wound.  Your wound feels warm to the touch.  You have pus or a bad smell coming from the wound.  You have a fever or chills.  You are nauseous or you vomit.  You are dizzy. Get help right away  if:  You have a red streak going away from your wound.  The edges of the wound open up and separate.  Your wound is bleeding, and the bleeding does not stop with gentle pressure.  You have a rash.  You faint.  You have trouble breathing. Summary  Always wash your hands with soap and water before changing your bandage (dressing).  To help with healing, eat foods that are rich in protein, vitamin A, vitamin C, and other nutrients.  Check your wound every day for signs of infection. Contact your health care provider if you suspect that your wound is infected. This information is not intended to replace advice given to you by your health care provider. Make sure you discuss any questions you have with your health care provider. Document Revised: 01/12/2019 Document Reviewed: 04/10/2016 Elsevier Patient Education  [place].

## 2020-09-14 ENCOUNTER — Encounter: Payer: Self-pay | Admitting: Dermatology

## 2020-09-14 ENCOUNTER — Telehealth: Payer: Self-pay | Admitting: Surgery

## 2020-09-14 ENCOUNTER — Telehealth: Payer: Self-pay

## 2020-09-14 NOTE — Telephone Encounter (Signed)
Patient is calling and is needing a doctors note letting his job know of his restrictions and needs to fax to his job fax number is [phone] attn: Merleen Nicely. Please call patient if any questions.

## 2020-09-14 NOTE — Telephone Encounter (Signed)
Work restriction note faxed to [employer] per Patient request. [phone]. Left message on machine letting patient know.

## 2020-09-22 ENCOUNTER — Other Ambulatory Visit: Payer: Self-pay

## 2020-09-22 ENCOUNTER — Encounter: Payer: Self-pay | Admitting: Physician Assistant

## 2020-09-22 ENCOUNTER — Ambulatory Visit (INDEPENDENT_AMBULATORY_CARE_PROVIDER_SITE_OTHER): Payer: 59 | Admitting: Physician Assistant

## 2020-09-22 VITALS — BP 121/80 | HR 81 | Temp 98.8°F | Ht 71.0 in | Wt 228.0 lb

## 2020-09-22 DIAGNOSIS — Z09 Encounter for follow-up examination after completed treatment for conditions other than malignant neoplasm: Secondary | ICD-10-CM

## 2020-09-22 DIAGNOSIS — L732 Hidradenitis suppurativa: Secondary | ICD-10-CM

## 2020-09-22 MED ORDER — OXYCODONE HCL 5 MG PO TABS: 5.0000 mg | ORAL_TABLET | Freq: Four times a day (QID) | ORAL | 0 refills | Status: DC | PRN

## 2020-09-22 MED ORDER — HYDROXYZINE HCL 10 MG PO TABS: 10.0000 mg | ORAL_TABLET | Freq: Three times a day (TID) | ORAL | 1 refills | Status: DC | PRN

## 2020-09-22 NOTE — Patient Instructions (Signed)
Please continue with daily wound care. Complete antibiotics. Pick up medication at pharmacy and please your appointment listed below.

## 2020-09-22 NOTE — Progress Notes (Signed)
[facility] POST-OP OFFICE VISIT  09/22/2020  HPI: Curtis Peterson is a 44 y.o. male 18 days s/p incision and drainage of suprapubic hidradenitis abscess x6 for suprapubic hidradenitis abscesses which unfortunately required takeback the following day to control bleeding.   He continues to do well He has done well at home with dressing changes by his GF and HHRN No fever, chills He does complain of significant itching at these sites Still having some intermittent throbbing pains at I&D sites No other complaints.   Vital signs: BP 121/80   Pulse 81   Temp 98.8 F (37.1 C) (Oral)   Ht 5\' 11"  (1.803 m)   Wt 228 lb (103.4 kg)   SpO2 97%   BMI 31.80 kg/m    Physical Exam: Constitutional: Well appearing male, NAD Skin: Multiple I&D sites to the suprapubic region,woundscontain iodoform gauze, some induration but no residual fluctuance or purulent drainage appreciable. All wound beds are 90-1005 granulation tissue  Assessment/Plan: This is a 44 y.o. male 18 days s/p incision and drainage of suprapubic hidradenitis abscess x6 for suprapubic hidradenitis abscesses which unfortunately required takeback the following day to control bleeding.    - I will send him Rx for Atarax as needed for itching  - I will provide one last refill of oxycodone for pain with dressing changes  - I preformed dressing change. He will continue to do this daily. Reviewed wound care instructions   -  RTC in 2 weeks for re-check   -- Edison Simon, PA-C [facility] 09/22/2020, 11:01 AM [phone] M-F: 7am - 4pm

## 2020-09-26 ENCOUNTER — Telehealth: Payer: Self-pay | Admitting: *Deleted

## 2020-09-26 NOTE — Telephone Encounter (Signed)
Faxed FMLA to [employer] at [phone]

## 2020-09-27 ENCOUNTER — Ambulatory Visit (INDEPENDENT_AMBULATORY_CARE_PROVIDER_SITE_OTHER): Payer: 59 | Admitting: Physician Assistant

## 2020-09-27 ENCOUNTER — Encounter: Payer: Self-pay | Admitting: Physician Assistant

## 2020-09-27 ENCOUNTER — Other Ambulatory Visit: Payer: Self-pay

## 2020-09-27 VITALS — BP 122/81 | HR 71 | Temp 98.1°F | Ht 71.0 in | Wt 230.0 lb

## 2020-09-27 DIAGNOSIS — Z09 Encounter for follow-up examination after completed treatment for conditions other than malignant neoplasm: Secondary | ICD-10-CM

## 2020-09-27 DIAGNOSIS — L732 Hidradenitis suppurativa: Secondary | ICD-10-CM

## 2020-09-27 NOTE — Progress Notes (Signed)
[facility] POST-OP OFFICE VISIT  09/27/2020  HPI: Curtis Peterson is a 44 y.o. male 23 days s/p incision anddrainage of suprapubic hidradenitis abscess x89for suprapubic hidradenitis abscesseswhich unfortunately required takeback the following day to control bleeding.   He went back to work yesterday and lifted a box and noticed some bleeding from his wound, which was concerning. This has since stopped but he is worried and wants his wound evaluated. No fever, chills. No issues with dressing changes.   Vital signs: BP 122/81    Pulse 71    Temp 98.1 F (36.7 C) (Oral)    Ht 5\' 11"  (1.803 m)    Wt 230 lb (104.3 kg)    SpO2 97%    BMI 32.08 kg/m    Physical Exam: Constitutional:Well appearing male, NAD Skin:Multiple I&D sites to the suprapubic region,woundscontainiodoform gauze, some induration but no residual fluctuance or purulent drainage appreciable. All wound beds are 90-100% granulation tissue. No appreciable bleeding  Assessment/Plan: This is a 44 y.o. male 23 days s/p incision anddrainage of suprapubic hidradenitis abscess x62for suprapubic hidradenitis abscesseswhich unfortunately required takeback the following day to control bleeding.    - I will write him out of work for additional time. Work note provided  - Continue local wound care. He will continue to do this daily. Reviewed wound care instructions   - He will RTC on 01/06 for hopefully final wound check   -- Edison Simon, PA-C [facility] 09/27/2020, 10:02 AM [phone] M-F: 7am - 4pm

## 2020-09-27 NOTE — Patient Instructions (Signed)
We will see you back in 2 weeks. See appointment below. If you have any concerns or questions, please feel free to call our office.

## 2020-10-03 ENCOUNTER — Encounter: Payer: 59 | Admitting: Physician Assistant

## 2020-10-13 ENCOUNTER — Other Ambulatory Visit: Payer: Self-pay

## 2020-10-13 ENCOUNTER — Ambulatory Visit (INDEPENDENT_AMBULATORY_CARE_PROVIDER_SITE_OTHER): Payer: 59 | Admitting: Physician Assistant

## 2020-10-13 ENCOUNTER — Ambulatory Visit (INDEPENDENT_AMBULATORY_CARE_PROVIDER_SITE_OTHER): Payer: 59 | Admitting: Dermatology

## 2020-10-13 ENCOUNTER — Encounter: Payer: Self-pay | Admitting: Physician Assistant

## 2020-10-13 VITALS — BP 122/84 | HR 89 | Temp 98.3°F | Ht 71.0 in | Wt 227.0 lb

## 2020-10-13 VITALS — Wt 227.0 lb

## 2020-10-13 DIAGNOSIS — L0292 Furuncle, unspecified: Secondary | ICD-10-CM | POA: Diagnosis not present

## 2020-10-13 DIAGNOSIS — L739 Follicular disorder, unspecified: Secondary | ICD-10-CM | POA: Diagnosis not present

## 2020-10-13 DIAGNOSIS — L709 Acne, unspecified: Secondary | ICD-10-CM

## 2020-10-13 DIAGNOSIS — L732 Hidradenitis suppurativa: Secondary | ICD-10-CM

## 2020-10-13 DIAGNOSIS — K130 Diseases of lips: Secondary | ICD-10-CM

## 2020-10-13 DIAGNOSIS — Z09 Encounter for follow-up examination after completed treatment for conditions other than malignant neoplasm: Secondary | ICD-10-CM

## 2020-10-13 DIAGNOSIS — Z79899 Other long term (current) drug therapy: Secondary | ICD-10-CM

## 2020-10-13 DIAGNOSIS — L859 Epidermal thickening, unspecified: Secondary | ICD-10-CM

## 2020-10-13 MED ORDER — ISOTRETINOIN 30 MG PO CAPS: 30.0000 mg | ORAL_CAPSULE | Freq: Every day | ORAL | 0 refills | Status: DC

## 2020-10-13 NOTE — Progress Notes (Signed)
Isotretinoin Follow-Up Visit   Subjective  Curtis Peterson is a 45 y.o. male who presents for the following: HS (Scalp, axilla, groin, 46m f/u Isotretinoin 20mg  1 po qd).  Week # 4   Isotretinoin F/U - 10/13/20 1600      Isotretinoin Follow Up   iPledge # 12/11/20    Date 10/13/20    Weight 227 lb (103 kg)    Acne breakouts since last visit? Yes      Dosage   Target Dosage (mg) 20,600    Current (To Date) Dosage (mg) 600mg     To Go Dosage (mg) 20,000      Side Effects   Skin WNL    Gastrointestinal WNL    Neurological WNL    Constitutional WNL           Side effects: Dry skin, dry lips  Denies changes in night vision, shortness of breath, abdominal pain, nausea, vomiting, diarrhea, blood in stool or urine, visual changes, headaches, epistaxis, joint pain, myalgias, mood changes, depression, or suicidal ideation.   The following portions of the chart were reviewed this encounter and updated as appropriate: medications, allergies, medical history  Review of Systems:  No other skin or systemic complaints except as noted in HPI or Assessment and Plan.  Objective  Well appearing patient in no apparent distress; mood and affect are within normal limits.  An examination of the face, neck, chest, and back was performed and relevant findings are noted below.   Objective  Scalp, axilla, groin: Scarring, cyst and fistula formation scalp   Assessment & Plan   Hidradenitis suppurativa Scalp, axilla, groin With folliculitis, acne, and furunculosis - Condition improved while on Isotretinoin Severe; On Isotretinoin -  requiring FDA mandated monthly evaluations and laboratory monitoring; Chronic and Persistent; Not to Goal  Hidradenitis Suppurativa is a chronic; persistent; non-curable, but treatable condition due to abnormal inflamed sweat glands in the body folds (axilla, inframammary, groin, medial thighs), causing recurrent painful cysts and scarring. It can be  associated with severe scarring acne and cysts; abscesses and scarring of scalp. The goal is control and prevention of flares, as it is not curable. Scars are permanent and can be thickened. Treatment may include daily use of topical medication and oral antibiotics.  Oral isotretinoin may also be helpful.  For more severe cases, Humira (a biologic injection) may be prescribed to decrease the inflammatory process and prevent flares.  When indicated, inflamed cysts may also be treated surgically.    Isotretinoin wk 4 IPLEDGE# 12/11/20 [facility], [address] Total mg Isotretinoin 600mg , 5.8mg /kg  Increase to Isotretinoin 30mg  1 po qd with fatty meal  Pt confirmed in IPLEDGE program  ISOtretinoin (ACCUTANE) 30 MG capsule - Scalp, axilla, groin   Xerosis secondary to isotretinoin therapy - Continue emollients as directed  Cheilitis secondary to isotretinoin therapy - Continue lip balm as directed, Dr. Cortibalm recommended  Long term medication management (isotretinoin) - While taking Isotretinoin and for 30 days after you finish the medication, do not share pills, do not donate blood. Isotretinoin is best absorbed when taken with a fatty meal. Isotretinoin can make you sensitive to the sun. Daily careful sun protection including sunscreen SPF 30+ when outdoors is recommended.  Follow-up in 30 days.  I, [id number], RMA, am acting as scribe for , MD .  Documentation: I have reviewed the above documentation for accuracy and completeness, and I agree with the above.  , MD

## 2020-10-13 NOTE — Patient Instructions (Signed)
Continue to wash the wound with soap and water daily. Cover with a dry dressing and secure with tape.

## 2020-10-13 NOTE — Progress Notes (Signed)
[facility] POST-OP OFFICE VISIT  10/13/2020  HPI: Curtis Peterson is a 45 y.o. male 39 days s/p incision anddrainage of suprapubic hidradenitis abscess x11for suprapubic hidradenitis abscesseswhich unfortunately required takeback the following day to control bleeding.  He is doing well He continues to have some lower abdominal soreness, worse if he accidentally hits one of the incision sites No fever, chills He continues to do well with packing at home, and is needing less and less No other complaints.   Vital signs: BP 122/84   Pulse 89   Temp 98.3 F (36.8 C) (Oral)   Ht 5\' 11"  (1.803 m)   Wt 227 lb (103 kg)   SpO2 94%   BMI 31.66 kg/m    Physical Exam: Constitutional: well appearing male, NAD Skin: A majority of his I&D sites have closed, there is one site in the suprapubic and then to the right of this that still need a small amount of packing. The wound bed is granulation tissue, no erythema, no purulence   Assessment/Plan: This is a 45 y.o. male 39 days s/p incision anddrainage of suprapubic hidradenitis abscess x73for suprapubic hidradenitis abscesseswhich unfortunately required takeback the following day to control bleeding.   - Wounds are healing well, I suspect this will no longer need any packing in the next week. He can transition to superficial dressings at that time.   - Okay to return to work   - Pain control prn; Tylenol/Motrin  - He will rtc in 2-3 weeks for final wound check  -- 7f, PA-C [facility] 10/13/2020, 10:09 AM [phone] M-F: 7am - 4pm

## 2020-10-25 ENCOUNTER — Encounter: Payer: Self-pay | Admitting: Dermatology

## 2020-10-31 ENCOUNTER — Encounter: Payer: Self-pay | Admitting: Physician Assistant

## 2020-11-03 ENCOUNTER — Ambulatory Visit (INDEPENDENT_AMBULATORY_CARE_PROVIDER_SITE_OTHER): Payer: 59 | Admitting: Physician Assistant

## 2020-11-03 ENCOUNTER — Other Ambulatory Visit: Payer: Self-pay

## 2020-11-03 ENCOUNTER — Encounter: Payer: Self-pay | Admitting: Physician Assistant

## 2020-11-03 VITALS — BP 148/93 | HR 73 | Temp 98.3°F | Ht 71.0 in | Wt 233.6 lb

## 2020-11-03 DIAGNOSIS — Z09 Encounter for follow-up examination after completed treatment for conditions other than malignant neoplasm: Secondary | ICD-10-CM

## 2020-11-03 DIAGNOSIS — L0231 Cutaneous abscess of buttock: Secondary | ICD-10-CM

## 2020-11-03 DIAGNOSIS — L732 Hidradenitis suppurativa: Secondary | ICD-10-CM

## 2020-11-03 DIAGNOSIS — L03317 Cellulitis of buttock: Secondary | ICD-10-CM

## 2020-11-03 NOTE — Progress Notes (Signed)
[facility] POST-OP OFFICE VISIT  11/03/2020  HPI: Curtis Peterson is a 45 y.o. male 1.5 months s/p incision anddrainage of suprapubic hidradenitis abscess x13for suprapubic hidradenitis abscesseswhich unfortunately required takeback the following day to control bleeding  He has done very well since the last time I saw him He is no longer requiring packing Minimal intermittent drainage No fever, chills, erythema Some itching He has returned to work without issues  Vital signs: BP (!) 148/93   Pulse 73   Temp 98.3 F (36.8 C) (Oral)   Ht 5\' 11"  (1.803 m)   Wt 233 lb 9.6 oz (106 kg)   SpO2 97%   BMI 32.58 kg/m    Physical Exam: Constitutional: Well appearing male, NAD Skin: His multiple I&D sites have closed, no erythema or fluctuance appreciable  Assessment/Plan: This is a 45 y.o. male 1.5 months s/p incision anddrainage of suprapubic hidradenitis abscess x85for suprapubic hidradenitis abscesseswhich unfortunately required takeback the following day to control bleeding   - Nothing further  - Continue superficial dressings for comfort  - He can rtc on as needed basis   -- Edison Simon, PA-C [facility] 11/03/2020, 10:59 AM [phone] M-F: 7am - 4pm

## 2020-11-03 NOTE — Patient Instructions (Signed)
Please call our office if you have questions or concerns.   

## 2020-11-14 ENCOUNTER — Other Ambulatory Visit: Payer: Self-pay

## 2020-11-14 ENCOUNTER — Ambulatory Visit (INDEPENDENT_AMBULATORY_CARE_PROVIDER_SITE_OTHER): Payer: 59 | Admitting: Dermatology

## 2020-11-14 VITALS — Wt 233.0 lb

## 2020-11-14 DIAGNOSIS — K130 Diseases of lips: Secondary | ICD-10-CM

## 2020-11-14 DIAGNOSIS — Z79899 Other long term (current) drug therapy: Secondary | ICD-10-CM | POA: Diagnosis not present

## 2020-11-14 DIAGNOSIS — L732 Hidradenitis suppurativa: Secondary | ICD-10-CM

## 2020-11-14 DIAGNOSIS — L853 Xerosis cutis: Secondary | ICD-10-CM

## 2020-11-14 MED ORDER — ISOTRETINOIN 40 MG PO CAPS: 40.0000 mg | ORAL_CAPSULE | Freq: Every day | ORAL | 0 refills | Status: AC

## 2020-11-14 NOTE — Progress Notes (Unsigned)
   Follow-Up Visit   Subjective  Curtis Peterson is a 45 y.o. male who presents for the following: HS  (Patient is currently on week 8 of Isotretinoin at 30mg  po QD - he c/o chapped lips, dry lips, dry nose, and dry skin. He has active lesions on the scalp today).  The following portions of the chart were reviewed this encounter and updated as appropriate:   Tobacco  Allergies  Meds  Problems  Med Hx  Surg Hx  Fam Hx     Review of Systems:  No other skin or systemic complaints except as noted in HPI or Assessment and Plan.  Objective  Well appearing patient in no apparent distress; mood and affect are within normal limits.  A focused examination was performed including the scalp . Relevant physical exam findings are noted in the Assessment and Plan.  Objective  Scalp: Significant scarring and fistulas of the scalp.   Assessment & Plan  Hidradenitis suppurativa Scalp Week 8 -  chronic, severe, not at goal. Severe; On Isotretinoin -  requiring FDA mandated monthly evaluations and laboratory monitoring; Chronic and Persistent; Not to Goal  While taking isotretinoin, do not share pills and do not donate blood. Isotretinoin is best absorbed when taken with a fatty meal. Isotretinoin can make you sensitive to the sun. Daily careful sun protection including sunscreen SPF 30+ when outdoors is recommended.  Increase Isotretinoin to 40mg  po QD. #30 0RF.  [facility] - [address] [place], [place] # [phone]  Dose to date 1,500mg /105.7kg = Total dose - 14.2mg /kg   ISOtretinoin (ACCUTANE) 40 MG capsule - Scalp  Other Related Medications ISOtretinoin (ACCUTANE) 30 MG capsule   Isotretinoin F/U - 11/14/20 1600      Isotretinoin Follow Up   iPledge # [id number]    Date 11/14/20    Weight 233 lb (105.7 kg)    Acne breakouts since last visit? Yes      Dosage   Target Dosage (mg) 20,600    Current (To Date) Dosage (mg) 1,500mg     To Go Dosage (mg) 19,100      Side  Effects   Skin Chapped Lips;Dry Eyes;Dry Lips;Dry Nose;Dry Skin    Gastrointestinal WNL    Neurological WNL    Constitutional WNL          Xerosis secondary to Isotretinoin  - diffuse xerotic patches - recommend gentle, hydrating skin care - gentle skin care handout given  Cheilitis secondary to Isotretinoin  - Continue lip balm as directed, Dr. Luvenia Heller Cortibalm or Aquaphor recommended  Return in about 1 month (around 12/12/2020).  Luther Redo, CMA, am acting as scribe for Sarina Ser, MD .  Documentation: I have reviewed the above documentation for accuracy and completeness, and I agree with the above.  Sarina Ser, MD

## 2020-11-14 NOTE — Patient Instructions (Signed)
Dry Skin Care  What causes dry skin?  Dry skin is common and results from inadequate moisture in the outer skin layers. Dry skin usually results from the excessive loss of moisture from the skin surface. This occurs due to two major factors: 1. Normally the skin's oil glands deposit a layer of oil on the skin's surface. This layer of oil prevents the loss of moisture from the skin. Exposure to soaps, cleaners, solvents, and disinfectants removes this oily film, allowing water to escape. 2. Water loss from the skin increases when the humidity is low. During winter months we spend a lot of time indoors where the air is heated. Heated air has very low humidity. This also contributes to dry skin.  A tendency for dry skin may accompany such disorders as eczema. Also, as people age, the number of functioning oil glands decreases, and the tendency toward dry skin can be a sensation of skin tightness when emerging from the shower.  How do I manage dry skin?  1. Humidify your environment. This can be accomplished by using a humidifier in your bedroom at night during winter months. 2. Bathing can actually put moisture back into your skin if done right. Take the following steps while bathing to sooth dry skin:  Avoid hot water, which only dries the skin and makes itching worse. Use warm water.  Avoid washcloths or extensive rubbing or scrubbing.  Use mild soaps like unscented Dove, Oil of Olay, Cetaphil, Basis, or CeraVe.  If you take baths rather than showers, rinse off soap residue with clean water before getting out of tub.  Once out of the shower/tub, pat dry gently with a soft towel. Leave your skin damp.  While still damp, apply any medicated ointment/cream you were prescribed to the affected areas. After you apply your medicated ointment/cream, then apply your moisturizer to your whole body.This is the most important step in dry skin care. If this is omitted, your skin will continue to be  dry.  The choice of moisturizer is also very important. In general, lotion will not provider enough moisture to severely dry skin because it is water based. You should use an ointment or cream. Moisturizers should also be unscented. Good choices include Vaseline (plain petrolatum), Aquaphor, Cetaphil, CeraVe, Vanicream, DML Forte, Aveeno moisture, or Eucerin Cream.  Bath oils can be helpful, but do not replace the application of moisturizer after the bath. In addition, they make the tub slippery causing an increased risk for falls. Therefore, we do not recommend their use. 

## 2020-11-17 ENCOUNTER — Encounter: Payer: Self-pay | Admitting: Dermatology

## 2020-11-22 ENCOUNTER — Encounter: Payer: Self-pay | Admitting: Physician Assistant

## 2020-11-22 ENCOUNTER — Other Ambulatory Visit: Payer: Self-pay

## 2020-11-22 ENCOUNTER — Telehealth: Payer: Self-pay | Admitting: Physician Assistant

## 2020-11-22 ENCOUNTER — Ambulatory Visit (INDEPENDENT_AMBULATORY_CARE_PROVIDER_SITE_OTHER): Payer: 59 | Admitting: Physician Assistant

## 2020-11-22 VITALS — BP 119/80 | HR 84 | Temp 97.7°F | Ht 71.0 in | Wt 234.6 lb

## 2020-11-22 DIAGNOSIS — L732 Hidradenitis suppurativa: Secondary | ICD-10-CM

## 2020-11-22 MED ORDER — SULFAMETHOXAZOLE-TRIMETHOPRIM 800-160 MG PO TABS: 1.0000 | ORAL_TABLET | Freq: Two times a day (BID) | ORAL | 0 refills | Status: AC

## 2020-11-22 NOTE — Progress Notes (Unsigned)
[facility] SUR[facility]ICAL CL[facility]IC NOTE  11/22/2020  History of Present Illness: Curtis Peterson is a 45 y.o. male well known to our service following multiple admissions and I&Ds for hidradenitis most recently in December of 2021. He hd done well after his last surgery but presents today with concerns of new possible abscess on his right medial thigh. He first noticed this area on Saturday which was small but thought nothing of it. This continued to swell, became erythematous, and tender to the touch. He was unsure if it had begun to spontaneously drain or not. No fever or chills at home. He is currently not taking any ABx. Again, he has a history of similar. Cx taken in December grew MSSA. He does follow with Dr Nehemiah Massed (dermatology) for [facility] and is currently on isotretinoin. No other complaints this morning.   Past Medical History: Past Medical History:  Diagnosis Date  . Diabetes mellitus without complication ([place])   . Hypertension      Past Surgical History: Past Surgical History:  Procedure Laterality Date  . head lesions removed  2015  . HYDRADENITIS EXCISION N/A 02/26/2020   Procedure: EXCISION HIDRADENITIS, Perineum;  Surgeon: Jules Husbands, MD;  Location: [facility] ORS;  Service: General;  Laterality: N/A;  . INCISION AND DRAINAGE ABSCESS N/A 09/04/2020   Procedure: INCISION AND DRAINAGE SKIN ABSCESS;  Surgeon: Olean Ree, MD;  Location: [facility] ORS;  Service: General;  Laterality: N/A;  Suprapubic area  . INCISION AND DRAINAGE PERIRECTAL ABSCESS N/A 06/30/2019   Procedure: IRRIGATION AND DEBRIDEMENT PERIRECTAL ABSCESS;  Surgeon: Jules Husbands, MD;  Location: [facility] ORS;  Service: General;  Laterality: N/A;  . WOUND EXPLORATION N/A 09/06/2020   Procedure: WOUND EXPLORATION;  Surgeon: Jules Husbands, MD;  Location: [facility] ORS;  Service: General;  Laterality: N/A;    Home Medications: Prior to Admission medications   Medication Sig Start Date End Date Taking? Authorizing Provider   acetaminophen (TYLENOL) 325 MG tablet Take 650 mg by mouth every 6 (six) hours as needed for moderate pain.    [provider]  hydrOXYzine (ATARAX/VISTARIL) 10 MG tablet Take 1 tablet (10 mg total) by mouth 3 (three) times daily as needed for itching. 09/22/20   Tylene Fantasia, PA-C  ISOtretinoin (ACCUTANE) 30 MG capsule Take 1 capsule (30 mg total) by mouth daily. Take with fatty meal 10/13/20   Ralene Bathe, MD  ISOtretinoin (ACCUTANE) 40 MG capsule Take 1 capsule (40 mg total) by mouth daily. 11/14/20 12/14/20  Ralene Bathe, MD  lisinopril (ZESTRIL) 2.5 MG tablet Take 2.5 mg by mouth daily.     [provider]  valACYclovir (VALTREX) 500 MG tablet Take 500 mg by mouth daily. 08/27/20   [provider]    Allergies: No Known Allergies  Review of Systems: Review of Systems  Constitutional: Negative for chills and fever.  Respiratory: Negative for cough and shortness of breath.   Cardiovascular: Negative for chest pain and palpitations.  Gastrointestinal: Negative for abdominal pain, nausea and vomiting.  Skin:       + Right Medial Thigh Abscess  All other systems reviewed and are negative.   Physical Exam There were no vitals taken for this visit.  Physical Exam Constitutional:      General: He is not in acute distress.    Appearance: Normal appearance. He is obese. He is not ill-appearing.  HENT:     Head: Normocephalic and atraumatic.  Eyes:     General: No scleral icterus.  Conjunctiva/sclera: Conjunctivae normal.  Pulmonary:     Effort: Pulmonary effort is normal. No respiratory distress.  Genitourinary:    Comments: Defered Musculoskeletal:     Right lower leg: No edema.     Left lower leg: No edema.  Skin:    General: Skin is warm and dry.     Coloration: Skin is not pale.     Findings: Erythema present.          Comments: He does have a small 2 x 2 cm area to the right proximal medial thigh which is erythematous and  indurated. I do not appreciate any gross fluctuance. There is central skin breakdown where I believe this has been draining from. No crepitus or evidence of necrotizing infection.   Neurological:     General: No focal deficit present.     Mental Status: He is alert and oriented to person, place, and time.  Psychiatric:        Mood and Affect: Mood normal.        Behavior: Behavior normal.     Labs/Imaging: No new pertinent labs or imaging  Assessment and Plan: This is a 45 y.o. male right thigh cellulitis/abscess complicated by a history of hidradenitis   - I do not think he has a drainable abscess at this time and the area is mostly indurated and may have spontaneously drained already. I will hold of on I&D at this time as I am not sure this will have high yield. I will start him on Bactrim BID as a precaution, his previous Cx have grown MSSA. I suspect this may also help consolidate this area into a drainable abscess. He was also encouraged to use warm compresses. I will plan to see him again on Friday for a re-check. He understands to call the office sooner should the area worsen or he develop fever, and at that time we can consider I&D.   Face-to-face time spent with the patient and care providers was 20 minutes, with more than 50% of the time spent counseling, educating, and coordinating care of the patient.     Edison Simon, PA-C [facility] 11/22/2020, 11:02 AM [phone] M-F: 7am - 4pm

## 2020-11-22 NOTE — Progress Notes (Signed)
Erroneous

## 2020-11-22 NOTE — Patient Instructions (Signed)
See your appointment listed below. Be sure to get your prescription from your pharmacy. Please contact us if you have any questions or concerns.

## 2020-11-22 NOTE — Telephone Encounter (Signed)
Patient calls, he is having another area of concern, now states that there is a new are inner thigh, it is black, hard and red with puss coming from the area.  Please call him.  Thank you.

## 2020-11-25 ENCOUNTER — Encounter: Payer: Self-pay | Admitting: Physician Assistant

## 2020-11-25 ENCOUNTER — Other Ambulatory Visit: Payer: Self-pay

## 2020-11-25 ENCOUNTER — Ambulatory Visit (INDEPENDENT_AMBULATORY_CARE_PROVIDER_SITE_OTHER): Payer: 59 | Admitting: Physician Assistant

## 2020-11-25 VITALS — BP 145/92 | HR 83 | Temp 97.9°F | Ht 71.0 in | Wt 233.4 lb

## 2020-11-25 DIAGNOSIS — Z09 Encounter for follow-up examination after completed treatment for conditions other than malignant neoplasm: Secondary | ICD-10-CM | POA: Diagnosis not present

## 2020-11-25 DIAGNOSIS — L732 Hidradenitis suppurativa: Secondary | ICD-10-CM | POA: Diagnosis not present

## 2020-11-25 NOTE — Progress Notes (Signed)
[facility] SURGICAL CLINIC NOTE  11/25/2020  History of Present Illness: Curtis Peterson is a 45 y.o. male well known to our service following multiple admissions and I&Ds for hidradenitis most recently in December of 2021. He has done well after his last surgery. He saw me on 02/15 secondary to concerns over an area on his right medial thigh that he was concerned about. At that time, he was started on Bactrim but there was no definitive abscess to drain. Today, he reports that the area on his thigh has continued to improved. Minimal pain. No fever, chills, or drainage., No other complaints or issues this morning.   Past Medical History: Past Medical History:  Diagnosis Date  . Diabetes mellitus without complication ([place])   . Hypertension      Past Surgical History: Past Surgical History:  Procedure Laterality Date  . head lesions removed  2015  . HYDRADENITIS EXCISION N/A 02/26/2020   Procedure: EXCISION HIDRADENITIS, Perineum;  Surgeon: Jules Husbands, MD;  Location: [facility] ORS;  Service: General;  Laterality: N/A;  . INCISION AND DRAINAGE ABSCESS N/A 09/04/2020   Procedure: INCISION AND DRAINAGE SKIN ABSCESS;  Surgeon: Olean Ree, MD;  Location: [facility] OR[facility];  Service: General;  Laterality: N/A;  Suprapubic area  . INCISION AND DRAINAGE PERIRECTAL ABSCESS N/A 06/30/2019   Procedure: IRRIGATION AND DEBRIDEMENT PERIRECTAL ABSCESS;  Surgeon: Jules Husbands, MD;  Location: [facility] OR[facility];  Service: General;  Laterality: N/A;  . WOUND EXPLORATION N/A 09/06/2020   Procedure: WOUND EXPLORATION;  Surgeon: Jules Husbands, MD;  Location: [facility] OR[facility];  Service: General;  Laterality: N/A;    Home Medications: Prior to Admission medications   Medication Sig Start Date End Date Taking? Authorizing Provider  acetaminophen (TYLENOL) 325 MG tablet Take 650 mg by mouth every 6 (six) hours as needed for moderate pain.   Yes [provider]  hydrOXYzine (ATARAX/VISTARIL) 10 MG tablet Take  1 tablet (10 mg total) by mouth 3 (three) times daily as needed for itching. 09/22/20  Yes Tylene Fantasia, PA-C  ISOtretinoin (ACCUTANE) 30 MG capsule Take 1 capsule (30 mg total) by mouth daily. Take with fatty meal 10/13/20  Yes Ralene Bathe, MD  ISOtretinoin (ACCUTANE) 40 MG capsule Take 1 capsule (40 mg total) by mouth daily. 11/14/20 12/14/20 Yes Ralene Bathe, MD  lisinopril (ZESTRIL) 2.5 MG tablet Take 2.5 mg by mouth daily.    Yes [provider]  sulfamethoxazole-trimethoprim (BACTRIM DS) 800-160 MG tablet Take 1 tablet by mouth 2 (two) times daily for 7 days. 11/22/20 11/29/20 Yes Tylene Fantasia, PA-C  valACYclovir (VALTREX) 500 MG tablet Take 500 mg by mouth daily. 08/27/20  Yes [provider]    Allergies: No Known Allergies  Review of Systems: Review of Systems  Constitutional: Negative for chills and fever.  Respiratory: Negative for cough and shortness of breath.   Cardiovascular: Negative for chest pain and palpitations.  Gastrointestinal: Negative for abdominal pain, nausea and vomiting.  Skin:       + Right Thigh Abscess  All other systems reviewed and are negative.   Physical Exam BP (!) 145/92   Pulse 83   Temp 97.9 F (36.6 C) (Oral)   Ht 5\' 11"  (1.803 m)   Wt 233 lb 6.4 oz (105.9 kg)   SpO2 97%   BMI 32.55 kg/m   Physical Exam Vitals and nursing note reviewed. Exam conducted with a chaperone present.  Constitutional:      General: He is  not in acute distress.    Appearance: Normal appearance. He is obese. He is not ill-appearing.  HENT:     Head: Normocephalic and atraumatic.  Eyes:     General: No scleral icterus.    Conjunctiva/sclera: Conjunctivae normal.  Pulmonary:     Effort: Pulmonary effort is normal. No respiratory distress.  Skin:    General: Skin is warm and dry.          Comments: Previous I&D sites to the abdomen have healed well  Neurological:     General: No focal deficit present.     Mental Status: He  is alert and oriented to person, place, and time.  Psychiatric:        Mood and Affect: Mood normal.        Behavior: Behavior normal.     Labs/Imaging: No new pertinent imaging studies  Assessment and Plan: This is a 45 y.o. male right thigh cellulitis/abscess complicated by a history of hidradenitis   - Complete ABx as prescribed  - No drainable abscess  - Follow up with dermatology as scheduled  - Follow up in general surgery clinic prn   Face-to-face time spent with the patient and care providers was 10 minutes, with more than 50% of the time spent counseling, educating, and coordinating care of the patient.    -- Edison Simon, PA-C [facility] 11/25/2020, 10:10 AM [phone] M-F: 7am - 4pm

## 2020-11-25 NOTE — Patient Instructions (Addendum)
The area is looking better. Use a Band-Aid to the area on your belly while open. Finish all of your antibiotics.   Follow-up with our office as needed.  Please call and ask to speak with a nurse if you develop questions or concerns.

## 2020-12-12 ENCOUNTER — Other Ambulatory Visit: Payer: Self-pay

## 2020-12-12 ENCOUNTER — Ambulatory Visit (INDEPENDENT_AMBULATORY_CARE_PROVIDER_SITE_OTHER): Payer: 59 | Admitting: Dermatology

## 2020-12-12 VITALS — Wt 233.4 lb

## 2020-12-12 DIAGNOSIS — L209 Atopic dermatitis, unspecified: Secondary | ICD-10-CM | POA: Diagnosis not present

## 2020-12-12 DIAGNOSIS — Z79899 Other long term (current) drug therapy: Secondary | ICD-10-CM | POA: Diagnosis not present

## 2020-12-12 DIAGNOSIS — L732 Hidradenitis suppurativa: Secondary | ICD-10-CM | POA: Diagnosis not present

## 2020-12-12 MED ORDER — ISOTRETINOIN 30 MG PO CAPS: ORAL_CAPSULE | ORAL | 0 refills | Status: DC

## 2020-12-12 MED ORDER — TRIAMCINOLONE ACETONIDE 0.1 % EX OINT: TOPICAL_OINTMENT | CUTANEOUS | 1 refills | Status: DC

## 2020-12-12 NOTE — Progress Notes (Unsigned)
Follow-Up Visit   Subjective  Curtis Peterson is a 45 y.o. male who presents for the following: hidradenitis suppurativa (Of the scalp and groin - patient is currently on week 12 of 40mg  po QD Isotretinoin. A week after increasing the dose to 40mg  patient started to experience a rash on his trunk and extremities that has become progressively worse. He has tried CeraVe cream and OTC diabetic dry skin lotion, but nothing has helped).  The following portions of the chart were reviewed this encounter and updated as appropriate:   Tobacco  Allergies  Meds  Problems  Med Hx  Surg Hx  Fam Hx     Review of Systems:  No other skin or systemic complaints except as noted in HPI or Assessment and Plan.  Objective  Well appearing patient in no apparent distress; mood and affect are within normal limits.  A focused examination was performed including the scalp and upper body. Relevant physical exam findings are noted in the Assessment and Plan.  Objective  Trunk and extremities: Scaly erythematous papules and patches +/- dyspigmentation, lichenification, excoriations.   Objective  Scalp: Significant scarring and fistulas of the scalp.  Assessment & Plan  Atopic dermatitis with flare Trunk and extremities 2ndary to Isotretinoin medication -  Start TMC 0.1% ointment to aa's BID x 2 weeks. Then decrease use to 5d/qk. Avoid f/g/a.  Topical steroids (such as triamcinolone, fluocinolone, fluocinonide, mometasone, clobetasol, halobetasol, betamethasone, hydrocortisone) can cause thinning and lightening of the skin if they are used for too long in the same area. Your physician has selected the right strength medicine for your problem and area affected on the body. Please use your medication only as directed by your physician to prevent side effects.   triamcinolone ointment (KENALOG) 0.1 % - Trunk and extremities  Hidradenitis suppurativa Scalp Week 12 - Acne is chronic, severe, not at  goal. Severe; On Isotretinoin -  requiring FDA mandated monthly evaluations and laboratory monitoring; Chronic and Persistent; Not to Goal  While taking isotretinoin, do not share pills and do not donate blood. Isotretinoin is best absorbed when taken with a fatty meal. Isotretinoin can make you sensitive to the sun. Daily careful sun protection including sunscreen SPF 30+ when outdoors is recommended.   Due to atopic dermatitis flare decrease Isotretinoin to 30mg  po QD.   Dose to date 2,700mg /105.9kg = 25.5mg /kg  ISOtretinoin (ACCUTANE) 30 MG capsule - Scalp  Other Related Medications ISOtretinoin (ACCUTANE) 30 MG capsule ISOtretinoin (ACCUTANE) 40 MG capsule   Isotretinoin F/U - 12/12/20 1235      Isotretinoin Follow Up   iPledge # [id number]    Date 12/12/20    Weight 233 lb 6.4 oz (105.9 kg)    Acne breakouts since last visit? Yes      Dosage   Target Dosage (mg) 21180    Current (To Date) Dosage (mg) 2700    To Go Dosage (mg) 18480      Side Effects   Skin Chapped Lips;Dry Eyes;Dry Lips;Dry Skin;Dry Nose    Gastrointestinal WNL    Neurological WNL    Constitutional WNL    Other Side Effects rash   started one week after increasing dose to 40mg          Return in about 1 month (around 01/12/2021) for HS/Isotretinoin follow up .  Luther Redo, CMA, am acting as scribe for Sarina Ser, MD .  Documentation: I have reviewed the above documentation for accuracy and completeness, and I agree with  the above.  Sarina Ser, MD

## 2020-12-13 ENCOUNTER — Encounter: Payer: Self-pay | Admitting: Dermatology

## 2020-12-16 ENCOUNTER — Other Ambulatory Visit: Payer: Self-pay | Admitting: Urology

## 2020-12-21 NOTE — Progress Notes (Deleted)
12/22/2020 8:50 PM   Curtis Peterson 1975/12/13 [id number]  Referring provider: Tracie Harrier, MD [address] [facility] [place],  [place] [address]  No chief complaint on file.  Urological history 1. 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 Hampton Abbot for a swollen foreskin.  He was seen by Dr. Hampton Abbot yesterday for symptoms of two weeks of swelling of his foreskin limiting the ability to retract the foreskin past the glans.  He was started on Bactrim DS and referred to [place].    He stated he started to have penile tenderness that started 2 weeks ago.  He states that he has increasingly been unable to pull back the foreskin to aid in urination and cleaning the head of his penis.  He has noted some blood on his underwear, but he denies any purulent discharge.  He is sexually active, but he states he is a monogamous relationship with his girlfriend.  Patient denies any gross hematuria, dysuria or suprapubic/flank pain.  Patient denies any fevers, chills, nausea or vomiting.  He is a diet-controlled diabetic.  His UA is orange cloudy, trace blood, 2+ protein, 1+ leukocytes and 6-10 WBCs, 0-2 RBCs, 0-10 epithelial cells, and few bacteria.  His PVR is 0 mL.    PMH: Past Medical History:  Diagnosis Date  . Diabetes mellitus without complication ([place])   . Hypertension     Surgical History: Past Surgical History:  Procedure Laterality Date  . head lesions removed  2015  . HYDRADENITIS EXCISION N/A 02/26/2020   Procedure: EXCISION HIDRADENITIS, Perineum;  Surgeon: Jules Husbands, MD;  Location: [facility] OR[facility];  Service: General;  Laterality: N/A;  . INCISION AND DRAINAGE ABSCESS N/A 09/04/2020   Procedure: INCISION AND DRAINAGE SKIN ABSCESS;  Surgeon: Olean Ree, MD;  Location: [facility];  Service: General;  Laterality: N/A;  Suprapubic  area  . INCISION AND DRAINAGE PERIRECTAL ABSCESS N/A 06/30/2019   Procedure: IRRIGATION AND DEBRIDEMENT PERIRECTAL ABSCESS;  Surgeon: Jules Husbands, MD;  Location: [facility];  Service: General;  Laterality: N/A;  . WOUND EXPLORATION N/A 09/06/2020   Procedure: WOUND EXPLORATION;  Surgeon: Jules Husbands, MD;  Location: [facility];  Service: General;  Laterality: N/A;    Home Medications:  Allergies as of 12/22/2020   No Known Allergies     Medication List       Accurate as of December 21, 2020  8:50 PM. If you have any questions, ask your nurse or doctor.        acetaminophen 325 MG tablet Commonly known as: TYLENOL Take 650 mg by mouth every 6 (six) hours as needed for moderate pain.   hydrOXYzine 10 MG tablet Commonly known as: ATARAX/VISTARIL Take 1 tablet (10 mg total) by mouth 3 (three) times daily as needed for itching.   ISOtretinoin 30 MG capsule Commonly known as: ACCUTANE Take 1 capsule (30 mg total) by mouth daily. Take with fatty meal   ISOtretinoin 30 MG capsule Commonly known as: ACCUTANE Take one cap po QD   lisinopril 2.5 MG tablet Commonly known as: ZESTRIL Take 2.5 mg by mouth daily.   triamcinolone ointment 0.1 % Commonly known as: KENALOG Apply to aa's rash BID x 2 weeks then decrease use to 5d/wk. Avoid face, groin, and axilla.   valACYclovir 500 MG tablet Commonly known  as: VALTREX Take 500 mg by mouth daily.       Allergies: No Known Allergies  Family History: Family History  Problem Relation Age of Onset  . Hypertension Other   . Diabetes Mother   . Diabetes Father     Social History:  reports that he has been smoking cigarettes. He has been smoking about 0.50 packs per day. He has never used smokeless tobacco. He reports that he does not drink alcohol and does not use drugs.  ROS:                                        Physical Exam: There were no vitals taken for this visit.  Constitutional:  Well nourished.  Alert and oriented, No acute distress. HEENT: [place] AT, mask in place.  Trachea midline, no masses. Cardiovascular: No clubbing, cyanosis, or edema. Respiratory: Normal respiratory effort, no increased work of breathing. GI: Abdomen is soft, non tender, non distended, no abdominal masses. Liver and spleen not palpable.  No hernias appreciated.  Stool sample for occult testing is not indicated.   GU: No CVA tenderness.  No bladder fullness or masses.  Patient with uncircumcised phallus.  Urethral meatus is patent.  No penile discharge. No penile lesions or rashes.  Neurologic: Grossly intact, no focal deficits, moving all 4 extremities. Psychiatric: Normal mood and affect.  Laboratory Data: Results for orders placed or performed during the hospital encounter of 09/02/20  Resp Panel by RT-PCR (Flu A&B, Covid) Nasopharyngeal Swab   Specimen: Nasopharyngeal Swab; Nasopharyngeal(NP) swabs in vial transport medium  Result Value Ref Range   SARS Coronavirus 2 by RT PCR NEGATIVE NEGATIVE   Influenza A by PCR NEGATIVE NEGATIVE   Influenza B by PCR NEGATIVE NEGATIVE  Culture, blood (routine x 2)   Specimen: BLOOD  Result Value Ref Range   Specimen Description BLOOD BLOOD RIGHT FOREARM    Special Requests      BOTTLES DRAWN AEROBIC AND ANAEROBIC Blood Culture adequate volume   Culture      NO GROWTH 5 DAYS Performed at [facility], [address] [place], [place] [address]    Report Status 09/08/2020 FINAL   Culture, blood (routine x 2)   Specimen: BLOOD  Result Value Ref Range   Specimen Description BLOOD LEFT ANTECUBITAL    Special Requests      BOTTLES DRAWN AEROBIC AND ANAEROBIC Blood Culture adequate volume   Culture      NO GROWTH 5 DAYS Performed at [facility], [address] [place], [place] [address]    Report Status 09/08/2020 FINAL   Urine culture   Specimen: Urine, Random  Result Value Ref Range   Specimen Description      URINE, RANDOM Performed at  [facility], [address], [place], [place] [address]    Special Requests      NONE Performed at [facility], [address], [place], [place] [address]    Culture      NO GROWTH Performed at [facility], [place] [address], [place], [place] [address]    Report Status 09/04/2020 FINAL   Aerobic/Anaerobic Culture (surgical/deep wound)   Specimen: PATH Cytology Misc. fluid; Body Fluid  Result Value Ref Range   Specimen Description      ABSCESS Performed at [facility], [address] [place],  [address]  Special Requests      NONE Performed at [facility], [place], [place] [address]    Gram Stain      RARE WBC PRESENT, PREDOMINANTLY PMN RARE GRAM POSITIVE COCCI IN PAIRS    Culture      FEW STREPTOCOCCUS GROUP C RARE STAPHYLOCOCCUS AUREUS Beta hemolytic streptococci are predictably susceptible to penicillin and other beta lactams. Susceptibility testing not routinely performed. NO ANAEROBES ISOLATED Performed at [facility], [place] [address], [place], [place] [address]    Report Status 09/09/2020 FINAL    Organism ID, Bacteria STAPHYLOCOCCUS AUREUS       Susceptibility   Staphylococcus aureus - MIC*    CIPROFLOXACIN <=0.5 SENSITIVE Sensitive     ERYTHROMYCIN <=0.25 SENSITIVE Sensitive     GENTAMICIN <=0.5 SENSITIVE Sensitive     OXACILLIN <=0.25 SENSITIVE Sensitive     TETRACYCLINE <=1 SENSITIVE Sensitive     VANCOMYCIN <=0.5 SENSITIVE Sensitive     TRIMETH/SULFA <=10 SENSITIVE Sensitive     CLINDAMYCIN <=0.25 SENSITIVE Sensitive     RIFAMPIN <=0.5 SENSITIVE Sensitive     Inducible Clindamycin NEGATIVE Sensitive     * RARE STAPHYLOCOCCUS AUREUS  Lactic acid, plasma  Result Value Ref Range   Lactic Acid, Venous 1.7 0.5 - 1.9 mmol/L  Comprehensive metabolic panel  Result Value Ref Range   Sodium 136 135 - 145 mmol/L   Potassium 4.0 3.5 - 5.1 mmol/L   Chloride 103 98 - 111 mmol/L   CO2 23  22 - 32 mmol/L   Glucose, Bld 125 (H) 70 - 99 mg/dL   BUN 11 6 - 20 mg/dL   Creatinine, Ser 0.66 0.61 - 1.24 mg/dL   Calcium 9.5 8.9 - 10.3 mg/dL   Total Protein 8.8 (H) 6.5 - 8.1 g/dL   Albumin 3.3 (L) 3.5 - 5.0 g/dL   AST 37 15 - 41 U/L   ALT 42 0 - 44 U/L   Alkaline Phosphatase 83 38 - 126 U/L   Total Bilirubin 0.5 0.3 - 1.2 mg/dL   GFR, Estimated >60 >60 mL/min   Anion gap 10 5 - 15  CBC with Differential  Result Value Ref Range   WBC 9.2 4.0 - 10.5 K/uL   RBC 5.04 4.22 - 5.81 MIL/uL   Hemoglobin 16.7 13.0 - 17.0 g/dL   HCT 50.7 39.0 - 52.0 %   MCV 100.6 (H) 80.0 - 100.0 fL   MCH 33.1 26.0 - 34.0 pg   MCHC 32.9 30.0 - 36.0 g/dL   RDW 13.2 11.5 - 15.5 %   Platelets 296 150 - 400 K/uL   nRBC 0.0 0.0 - 0.2 %   Neutrophils Relative % 59 %   Neutro Abs 5.4 1.7 - 7.7 K/uL   Lymphocytes Relative 32 %   Lymphs Abs 2.9 0.7 - 4.0 K/uL   Monocytes Relative 6 %   Monocytes Absolute 0.5 0.1 - 1.0 K/uL   Eosinophils Relative 2 %   Eosinophils Absolute 0.2 0.0 - 0.5 K/uL   Basophils Relative 1 %   Basophils Absolute 0.1 0.0 - 0.1 K/uL   Immature Granulocytes 0 %   Abs Immature Granulocytes 0.02 0.00 - 0.07 K/uL  Urinalysis, Complete w Microscopic  Result Value Ref Range   Color, Urine YELLOW (A) YELLOW   APPearance CLEAR (A) CLEAR   Specific Gravity, Urine 1.031 (H) 1.005 - 1.030   pH 5.0 5.0 - 8.0   Glucose, UA NEGATIVE NEGATIVE mg/dL  Hgb urine dipstick SMALL (A) NEGATIVE   Bilirubin Urine NEGATIVE NEGATIVE   Ketones, ur NEGATIVE NEGATIVE mg/dL   Protein, ur 30 (A) NEGATIVE mg/dL   Nitrite NEGATIVE NEGATIVE   Leukocytes,Ua NEGATIVE NEGATIVE   WBC, UA NONE SEEN 0 - 5 WBC/hpf   Bacteria, UA NONE SEEN NONE SEEN   Squamous Epithelial / LPF NONE SEEN 0 - 5   Mucus PRESENT   HIV Antibody (routine testing w rflx)  Result Value Ref Range   HIV Screen 4th Generation wRfx Non Reactive Non Reactive  Basic metabolic panel  Result Value Ref Range   Sodium 136 135 - 145 mmol/L    Potassium 4.0 3.5 - 5.1 mmol/L   Chloride 106 98 - 111 mmol/L   CO2 23 22 - 32 mmol/L   Glucose, Bld 110 (H) 70 - 99 mg/dL   BUN 9 6 - 20 mg/dL   Creatinine, Ser 0.64 0.61 - 1.24 mg/dL   Calcium 8.9 8.9 - 10.3 mg/dL   GFR, Estimated >60 >60 mL/min   Anion gap 7 5 - 15  CBC  Result Value Ref Range   WBC 8.4 4.0 - 10.5 K/uL   RBC 4.62 4.22 - 5.81 MIL/uL   Hemoglobin 15.3 13.0 - 17.0 g/dL   HCT 46.5 39.0 - 52.0 %   MCV 100.6 (H) 80.0 - 100.0 fL   MCH 33.1 26.0 - 34.0 pg   MCHC 32.9 30.0 - 36.0 g/dL   RDW 13.3 11.5 - 15.5 %   Platelets 255 150 - 400 K/uL   nRBC 0.4 (H) 0.0 - 0.2 %  Hemoglobin A1c  Result Value Ref Range   Hgb A1c MFr Bld 6.0 (H) 4.8 - 5.6 %   Mean Plasma Glucose 125.5 mg/dL  CBC  Result Value Ref Range   WBC 6.2 4.0 - 10.5 K/uL   RBC 4.49 4.22 - 5.81 MIL/uL   Hemoglobin 14.9 13.0 - 17.0 g/dL   HCT 45.4 39.0 - 52.0 %   MCV 101.1 (H) 80.0 - 100.0 fL   MCH 33.2 26.0 - 34.0 pg   MCHC 32.8 30.0 - 36.0 g/dL   RDW 13.1 11.5 - 15.5 %   Platelets 217 150 - 400 K/uL   nRBC 0.0 0.0 - 0.2 %  Comprehensive metabolic panel  Result Value Ref Range   Sodium 137 135 - 145 mmol/L   Potassium 3.9 3.5 - 5.1 mmol/L   Chloride 105 98 - 111 mmol/L   CO2 25 22 - 32 mmol/L   Glucose, Bld 106 (H) 70 - 99 mg/dL   BUN 8 6 - 20 mg/dL   Creatinine, Ser 0.52 (L) 0.61 - 1.24 mg/dL   Calcium 9.1 8.9 - 10.3 mg/dL   Total Protein 7.3 6.5 - 8.1 g/dL   Albumin 2.8 (L) 3.5 - 5.0 g/dL   AST 25 15 - 41 U/L   ALT 31 0 - 44 U/L   Alkaline Phosphatase 64 38 - 126 U/L   Total Bilirubin 0.7 0.3 - 1.2 mg/dL   GFR, Estimated >60 >60 mL/min   Anion gap 7 5 - 15  Glucose, capillary  Result Value Ref Range   Glucose-Capillary 86 70 - 99 mg/dL  Glucose, capillary  Result Value Ref Range   Glucose-Capillary 102 (H) 70 - 99 mg/dL  Glucose, capillary  Result Value Ref Range   Glucose-Capillary 157 (H) 70 - 99 mg/dL  Glucose, capillary  Result Value Ref Range   Glucose-Capillary 141 (H) 70 -  99 mg/dL  Glucose, capillary  Result Value Ref Range   Glucose-Capillary 200 (H) 70 - 99 mg/dL  CBC  Result Value Ref Range   WBC 9.2 4.0 - 10.5 K/uL   RBC 4.43 4.22 - 5.81 MIL/uL   Hemoglobin 14.6 13.0 - 17.0 g/dL   HCT 45.6 39.0 - 52.0 %   MCV 102.9 (H) 80.0 - 100.0 fL   MCH 33.0 26.0 - 34.0 pg   MCHC 32.0 30.0 - 36.0 g/dL   RDW 12.7 11.5 - 15.5 %   Platelets 221 150 - 400 K/uL   nRBC 0.0 0.0 - 0.2 %  Comprehensive metabolic panel  Result Value Ref Range   Sodium 134 (L) 135 - 145 mmol/L   Potassium 4.7 3.5 - 5.1 mmol/L   Chloride 106 98 - 111 mmol/L   CO2 21 (L) 22 - 32 mmol/L   Glucose, Bld 159 (H) 70 - 99 mg/dL   BUN 12 6 - 20 mg/dL   Creatinine, Ser 0.68 0.61 - 1.24 mg/dL   Calcium 9.1 8.9 - 10.3 mg/dL   Total Protein 7.1 6.5 - 8.1 g/dL   Albumin 2.7 (L) 3.5 - 5.0 g/dL   AST 25 15 - 41 U/L   ALT 33 0 - 44 U/L   Alkaline Phosphatase 64 38 - 126 U/L   Total Bilirubin 0.6 0.3 - 1.2 mg/dL   GFR, Estimated >60 >60 mL/min   Anion gap 7 5 - 15  Glucose, capillary  Result Value Ref Range   Glucose-Capillary 182 (H) 70 - 99 mg/dL  Glucose, capillary  Result Value Ref Range   Glucose-Capillary 119 (H) 70 - 99 mg/dL  Glucose, capillary  Result Value Ref Range   Glucose-Capillary 116 (H) 70 - 99 mg/dL  Glucose, capillary  Result Value Ref Range   Glucose-Capillary 107 (H) 70 - 99 mg/dL  Basic metabolic panel  Result Value Ref Range   Sodium 138 135 - 145 mmol/L   Potassium 4.2 3.5 - 5.1 mmol/L   Chloride 107 98 - 111 mmol/L   CO2 23 22 - 32 mmol/L   Glucose, Bld 101 (H) 70 - 99 mg/dL   BUN 16 6 - 20 mg/dL   Creatinine, Ser 0.78 0.61 - 1.24 mg/dL   Calcium 9.0 8.9 - 10.3 mg/dL   GFR, Estimated >60 >60 mL/min   Anion gap 8 5 - 15  Glucose, capillary  Result Value Ref Range   Glucose-Capillary 94 70 - 99 mg/dL  Glucose, capillary  Result Value Ref Range   Glucose-Capillary 100 (H) 70 - 99 mg/dL  Glucose, capillary  Result Value Ref Range   Glucose-Capillary 76  70 - 99 mg/dL  Hemoglobin and hematocrit, blood  Result Value Ref Range   Hemoglobin 13.3 13.0 - 17.0 g/dL   HCT 40.3 39.0 - 52.0 %  Glucose, capillary  Result Value Ref Range   Glucose-Capillary 110 (H) 70 - 99 mg/dL  Glucose, capillary  Result Value Ref Range   Glucose-Capillary 114 (H) 70 - 99 mg/dL  CBC  Result Value Ref Range   WBC 7.0 4.0 - 10.5 K/uL   RBC 3.35 (L) 4.22 - 5.81 MIL/uL   Hemoglobin 11.2 (L) 13.0 - 17.0 g/dL   HCT 34.1 (L) 39.0 - 52.0 %   MCV 101.8 (H) 80.0 - 100.0 fL   MCH 33.4 26.0 - 34.0 pg   MCHC 32.8 30.0 - 36.0 g/dL   RDW 12.8 11.5 - 15.5 %   Platelets 188  150 - 400 K/uL   nRBC 0.0 0.0 - 0.2 %  Glucose, capillary  Result Value Ref Range   Glucose-Capillary 161 (H) 70 - 99 mg/dL  Glucose, capillary  Result Value Ref Range   Glucose-Capillary 106 (H) 70 - 99 mg/dL  CBG monitoring, ED  Result Value Ref Range   Glucose-Capillary 86 70 - 99 mg/dL  CBG monitoring, ED  Result Value Ref Range   Glucose-Capillary 135 (H) 70 - 99 mg/dL  Type and screen  Result Value Ref Range   ABO/RH(D) O POS    Antibody Screen NEG    Sample Expiration      09/09/2020,2359 Performed at [facility], [address] [place], [place] [address]    Lab Results  Component Value Date   WBC 7.0 09/07/2020   HGB 11.2 (L) 09/07/2020   HCT 34.1 (L) 09/07/2020   MCV 101.8 (H) 09/07/2020   PLT 188 09/07/2020    Lab Results  Component Value Date   CREATININE 0.78 09/06/2020    Lab Results  Component Value Date   HGBA1C 6.0 (H) 09/03/2020    Lab Results  Component Value Date   AST 25 09/05/2020   Lab Results  Component Value Date   ALT 33 09/05/2020  I have reviewed the labs.   Pertinent Imaging: No recent imaging    Assessment & Plan:    1. Genital herpes *** Secondary to herpes Will continue Valtrex 500 mg daily Counseled regarding the fact it is a STI and can be transmitted through any type of sexual activity  RTC in 6 months for  recheck  No follow-ups on file.  These notes generated with voice recognition software. I apologize for typographical errors.  Zara Council, PA-C  [facility] [address]  [place], [place] [address] [phone]

## 2020-12-22 ENCOUNTER — Ambulatory Visit: Payer: 59 | Admitting: Urology

## 2020-12-22 DIAGNOSIS — A6002 Herpesviral infection of other male genital organs: Secondary | ICD-10-CM

## 2020-12-23 ENCOUNTER — Ambulatory Visit (INDEPENDENT_AMBULATORY_CARE_PROVIDER_SITE_OTHER): Payer: 59 | Admitting: Urology

## 2020-12-23 ENCOUNTER — Encounter: Payer: Self-pay | Admitting: Urology

## 2020-12-23 ENCOUNTER — Other Ambulatory Visit: Payer: Self-pay

## 2020-12-23 VITALS — BP 134/89 | HR 74 | Ht 71.0 in | Wt 232.0 lb

## 2020-12-23 DIAGNOSIS — A6002 Herpesviral infection of other male genital organs: Secondary | ICD-10-CM

## 2020-12-23 MED ORDER — VALACYCLOVIR HCL 500 MG PO TABS: 500.0000 mg | ORAL_TABLET | Freq: Every day | ORAL | 3 refills | Status: DC

## 2020-12-23 NOTE — Progress Notes (Signed)
12/23/2020 11:04 AM   Curtis Peterson 07/06/1976 [id number]  Referring provider: Tracie Harrier, MD [address] [facility] [place],  [place] [address]  Chief Complaint  Patient presents with  . Follow-up   Urological history 1. 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 male who presents today for a yearly recheck.    He has no urinary complaints at this visit.  Patient denies any modifying or aggravating factors.  Patient denies any gross hematuria, dysuria or suprapubic/flank pain.  Patient denies any fevers, chills, nausea or vomiting.   Patient still having spontaneous erections.  He denies any pain or curvature with erections.   PMH: Past Medical History:  Diagnosis Date  . Diabetes mellitus without complication ([place])   . Hypertension     Surgical History: Past Surgical History:  Procedure Laterality Date  . head lesions removed  2015  . HYDRADENITIS EXCISION N/A 02/26/2020   Procedure: EXCISION HIDRADENITIS, Perineum;  Surgeon: Jules Husbands, MD;  Location: [facility] ORS;  Service: General;  Laterality: N/A;  . INCISION AND DRAINAGE ABSCESS N/A 09/04/2020   Procedure: INCISION AND DRAINAGE SKIN ABSCESS;  Surgeon: Olean Ree, MD;  Location: [facility] OR[facility];  Service: General;  Laterality: N/A;  Suprapubic area  . INCISION AND DRAINAGE PERIRECTAL ABSCESS N/A 06/30/2019   Procedure: IRRIGATION AND DEBRIDEMENT PERIRECTAL ABSCESS;  Surgeon: Jules Husbands, MD;  Location: [facility] OR[facility];  Service: General;  Laterality: N/A;  . WOUND EXPLORATION N/A 09/06/2020   Procedure: WOUND EXPLORATION;  Surgeon: Jules Husbands, MD;  Location: [facility] OR[facility];  Service: General;  Laterality: N/A;    Home Medications:  Allergies as of 12/23/2020   No Known Allergies     Medication List       Accurate as of December 23, 2020 11:04 AM. If you have any questions, ask your nurse or doctor.        acetaminophen 325 MG tablet Commonly known  as: TYLENOL Take 650 mg by mouth every 6 (six) hours as needed for moderate pain.   hydrOXYzine 10 MG tablet Commonly known as: ATARAX/VISTARIL Take 1 tablet (10 mg total) by mouth 3 (three) times daily as needed for itching.   ISOtretinoin 30 MG capsule Commonly known as: ACCUTANE Take one cap po QD   lisinopril 2.5 MG tablet Commonly known as: ZESTRIL Take 2.5 mg by mouth daily.   triamcinolone ointment 0.1 % Commonly known as: KENALOG Apply to aa's rash BID x 2 weeks then decrease use to 5d/wk. Avoid face, groin, and axilla.   valACYclovir 500 MG tablet Commonly known as: VALTREX Take 1 tablet (500 mg total) by mouth daily.       Allergies: No Known Allergies  Family History: Family History  Problem Relation Age of Onset  . Hypertension Other   . Diabetes Mother   . Diabetes Father     Social History:  reports that he has been smoking cigarettes. He has been smoking about 0.50 packs per day. He has never used smokeless tobacco. He reports that he does not drink alcohol and does not use drugs.   ROS   Physical Exam: BP 134/89   Pulse 74   Ht 5\' 11"  (1.803 m)   Wt 232 lb (105.2 kg)   BMI 32.36 kg/m   Constitutional:  Well nourished. Alert and oriented, No acute distress. HEENT: [place] AT, mask in place.  Trachea midline, no masses. Cardiovascular: No clubbing, cyanosis, or edema.  Respiratory: Normal respiratory effort, no increased work of breathing. Neurologic: Grossly intact, no focal deficits, moving all 4 extremities. Psychiatric: Normal mood and affect.  Laboratory Data: Results for orders placed or performed during the hospital encounter of 09/02/20  Resp Panel by RT-PCR (Flu A&B, Covid) Nasopharyngeal Swab   Specimen: Nasopharyngeal Swab; Nasopharyngeal(NP) swabs in vial transport medium  Result Value Ref Range   SARS Coronavirus 2 by RT PCR NEGATIVE NEGATIVE   Influenza A by PCR NEGATIVE NEGATIVE   Influenza B by PCR NEGATIVE NEGATIVE  Culture,  blood (routine x 2)   Specimen: BLOOD  Result Value Ref Range   Specimen Description BLOOD BLOOD RIGHT FOREARM    Special Requests      BOTTLES DRAWN AEROBIC AND ANAEROBIC Blood Culture adequate volume   Culture      NO GROWTH 5 DAYS Performed at [facility], [address] [place], [place] [address]    Report Status 09/08/2020 FINAL   Culture, blood (routine x 2)   Specimen: BLOOD  Result Value Ref Range   Specimen Description BLOOD LEFT ANTECUBITAL    Special Requests      BOTTLES DRAWN AEROBIC AND ANAEROBIC Blood Culture adequate volume   Culture      NO GROWTH 5 DAYS Performed at [facility], [address], [place], [place] [address]    Report Status 09/08/2020 FINAL   Urine culture   Specimen: Urine, Random  Result Value Ref Range   Specimen Description      URINE, RANDOM Performed at [facility], [address] [place], [place] [address]    Special Requests      NONE Performed at [facility], [address] [place], [place] [address]    Culture      NO GROWTH Performed at [facility], [place] [address] [place], [place] [address]    Report Status 09/04/2020 FINAL   Aerobic/Anaerobic Culture (surgical/deep wound)   Specimen: PATH Cytology Misc. fluid; Body Fluid  Result Value Ref Range   Specimen Description      ABSCESS Performed at [facility], [address] [place], [place] [address]    Special Requests      NONE Performed at [facility], [address] [place], [place] [address]    Gram Stain      RARE WBC PRESENT, PREDOMINANTLY PMN RARE GRAM POSITIVE COCCI IN PAIRS    Culture      FEW STREPTOCOCCUS GROUP C RARE STAPHYLOCOCCUS AUREUS Beta hemolytic streptococci are predictably susceptible to penicillin and other beta lactams. Susceptibility testing not routinely performed. NO ANAEROBES ISOLATED Performed at [facility], [place] [address] [place], [place] [address]     Report Status 09/09/2020 FINAL    Organism ID, Bacteria STAPHYLOCOCCUS AUREUS       Susceptibility   Staphylococcus aureus - MIC*    CIPROFLOXACIN <=0.5 SENSITIVE Sensitive     ERYTHROMYCIN <=0.25 SENSITIVE Sensitive     GENTAMICIN <=0.5 SENSITIVE Sensitive     OXACILLIN <=0.25 SENSITIVE Sensitive     TETRACYCLINE <=1 SENSITIVE Sensitive     VANCOMYCIN <=0.5 SENSITIVE Sensitive     TRIMETH/SULFA <=10 SENSITIVE Sensitive     CLINDAMYCIN <=0.25 SENSITIVE Sensitive     RIFAMPIN <=0.5 SENSITIVE Sensitive     Inducible Clindamycin NEGATIVE Sensitive     * RARE STAPHYLOCOCCUS AUREUS  Lactic acid, plasma  Result Value Ref Range   Lactic Acid, Venous 1.7 0.5 - 1.9 mmol/L  Comprehensive metabolic panel  Result Value Ref Range   Sodium 136 135 - 145 mmol/L   Potassium 4.0 3.5 - 5.1 mmol/L   Chloride 103 98 - 111 mmol/L   CO2 23 22 - 32 mmol/L   Glucose, Bld 125 (H) 70 - 99 mg/dL   BUN 11 6 - 20 mg/dL   Creatinine, Ser 0.66 0.61 - 1.24 mg/dL   Calcium 9.5 8.9 - 10.3 mg/dL   Total Protein 8.8 (H) 6.5 - 8.1 g/dL   Albumin 3.3 (L) 3.5 - 5.0 g/dL   AST 37 15 - 41 U/L   ALT 42 0 - 44 U/L   Alkaline Phosphatase 83 38 - 126 U/L   Total Bilirubin 0.5 0.3 - 1.2 mg/dL   GFR, Estimated >60 >60 mL/min   Anion gap 10 5 - 15  CBC with Differential  Result Value Ref Range   WBC 9.2 4.0 - 10.5 K/uL   RBC 5.04 4.22 - 5.81 MIL/uL   Hemoglobin 16.7 13.0 - 17.0 g/dL   HCT 50.7 39.0 - 52.0 %   MCV 100.6 (H) 80.0 - 100.0 fL   MCH 33.1 26.0 - 34.0 pg   MCHC 32.9 30.0 - 36.0 g/dL   RDW 13.2 11.5 - 15.5 %   Platelets 296 150 - 400 K/uL   nRBC 0.0 0.0 - 0.2 %   Neutrophils Relative % 59 %   Neutro Abs 5.4 1.7 - 7.7 K/uL   Lymphocytes Relative 32 %   Lymphs Abs 2.9 0.7 - 4.0 K/uL   Monocytes Relative 6 %   Monocytes Absolute 0.5 0.1 - 1.0 K/uL   Eosinophils Relative 2 %   Eosinophils Absolute 0.2 0.0 - 0.5 K/uL   Basophils Relative 1 %   Basophils Absolute 0.1 0.0 - 0.1 K/uL   Immature  Granulocytes 0 %   Abs Immature Granulocytes 0.02 0.00 - 0.07 K/uL  Urinalysis, Complete w Microscopic  Result Value Ref Range   Color, Urine YELLOW (A) YELLOW   APPearance CLEAR (A) CLEAR   Specific Gravity, Urine 1.031 (H) 1.005 - 1.030   pH 5.0 5.0 - 8.0   Glucose, UA NEGATIVE NEGATIVE mg/dL   Hgb urine dipstick SMALL (A) NEGATIVE   Bilirubin Urine NEGATIVE NEGATIVE   Ketones, ur NEGATIVE NEGATIVE mg/dL   Protein, ur 30 (A) NEGATIVE mg/dL   Nitrite NEGATIVE NEGATIVE   Leukocytes,Ua NEGATIVE NEGATIVE   WBC, UA NONE SEEN 0 - 5 WBC/hpf   Bacteria, UA NONE SEEN NONE SEEN   Squamous Epithelial / LPF NONE SEEN 0 - 5   Mucus PRESENT   HIV Antibody (routine testing w rflx)  Result Value Ref Range   HIV Screen 4th Generation wRfx Non Reactive Non Reactive  Basic metabolic panel  Result Value Ref Range   Sodium 136 135 - 145 mmol/L   Potassium 4.0 3.5 - 5.1 mmol/L   Chloride 106 98 - 111 mmol/L   CO2 23 22 - 32 mmol/L   Glucose, Bld 110 (H) 70 - 99 mg/dL   BUN 9 6 - 20 mg/dL   Creatinine, Ser 0.64 0.61 - 1.24 mg/dL   Calcium 8.9 8.9 - 10.3 mg/dL   GFR, Estimated >60 >60 mL/min   Anion gap 7 5 - 15  CBC  Result Value Ref Range   WBC 8.4 4.0 - 10.5 K/uL   RBC 4.62 4.22 - 5.81 MIL/uL   Hemoglobin 15.3 13.0 - 17.0 g/dL   HCT 46.5 39.0 - 52.0 %  MCV 100.6 (H) 80.0 - 100.0 fL   MCH 33.1 26.0 - 34.0 pg   MCHC 32.9 30.0 - 36.0 g/dL   RDW 13.3 11.5 - 15.5 %   Platelets 255 150 - 400 K/uL   nRBC 0.4 (H) 0.0 - 0.2 %  Hemoglobin A1c  Result Value Ref Range   Hgb A1c MFr Bld 6.0 (H) 4.8 - 5.6 %   Mean Plasma Glucose 125.5 mg/dL  CBC  Result Value Ref Range   WBC 6.2 4.0 - 10.5 K/uL   RBC 4.49 4.22 - 5.81 MIL/uL   Hemoglobin 14.9 13.0 - 17.0 g/dL   HCT 45.4 39.0 - 52.0 %   MCV 101.1 (H) 80.0 - 100.0 fL   MCH 33.2 26.0 - 34.0 pg   MCHC 32.8 30.0 - 36.0 g/dL   RDW 13.1 11.5 - 15.5 %   Platelets 217 150 - 400 K/uL   nRBC 0.0 0.0 - 0.2 %  Comprehensive metabolic panel  Result  Value Ref Range   Sodium 137 135 - 145 mmol/L   Potassium 3.9 3.5 - 5.1 mmol/L   Chloride 105 98 - 111 mmol/L   CO2 25 22 - 32 mmol/L   Glucose, Bld 106 (H) 70 - 99 mg/dL   BUN 8 6 - 20 mg/dL   Creatinine, Ser 0.52 (L) 0.61 - 1.24 mg/dL   Calcium 9.1 8.9 - 10.3 mg/dL   Total Protein 7.3 6.5 - 8.1 g/dL   Albumin 2.8 (L) 3.5 - 5.0 g/dL   AST 25 15 - 41 U/L   ALT 31 0 - 44 U/L   Alkaline Phosphatase 64 38 - 126 U/L   Total Bilirubin 0.7 0.3 - 1.2 mg/dL   GFR, Estimated >60 >60 mL/min   Anion gap 7 5 - 15  Glucose, capillary  Result Value Ref Range   Glucose-Capillary 86 70 - 99 mg/dL  Glucose, capillary  Result Value Ref Range   Glucose-Capillary 102 (H) 70 - 99 mg/dL  Glucose, capillary  Result Value Ref Range   Glucose-Capillary 157 (H) 70 - 99 mg/dL  Glucose, capillary  Result Value Ref Range   Glucose-Capillary 141 (H) 70 - 99 mg/dL  Glucose, capillary  Result Value Ref Range   Glucose-Capillary 200 (H) 70 - 99 mg/dL  CBC  Result Value Ref Range   WBC 9.2 4.0 - 10.5 K/uL   RBC 4.43 4.22 - 5.81 MIL/uL   Hemoglobin 14.6 13.0 - 17.0 g/dL   HCT 45.6 39.0 - 52.0 %   MCV 102.9 (H) 80.0 - 100.0 fL   MCH 33.0 26.0 - 34.0 pg   MCHC 32.0 30.0 - 36.0 g/dL   RDW 12.7 11.5 - 15.5 %   Platelets 221 150 - 400 K/uL   nRBC 0.0 0.0 - 0.2 %  Comprehensive metabolic panel  Result Value Ref Range   Sodium 134 (L) 135 - 145 mmol/L   Potassium 4.7 3.5 - 5.1 mmol/L   Chloride 106 98 - 111 mmol/L   CO2 21 (L) 22 - 32 mmol/L   Glucose, Bld 159 (H) 70 - 99 mg/dL   BUN 12 6 - 20 mg/dL   Creatinine, Ser 0.68 0.61 - 1.24 mg/dL   Calcium 9.1 8.9 - 10.3 mg/dL   Total Protein 7.1 6.5 - 8.1 g/dL   Albumin 2.7 (L) 3.5 - 5.0 g/dL   AST 25 15 - 41 U/L   ALT 33 0 - 44 U/L   Alkaline Phosphatase 64 38 - 126  U/L   Total Bilirubin 0.6 0.3 - 1.2 mg/dL   GFR, Estimated >60 >60 mL/min   Anion gap 7 5 - 15  Glucose, capillary  Result Value Ref Range   Glucose-Capillary 182 (H) 70 - 99 mg/dL   Glucose, capillary  Result Value Ref Range   Glucose-Capillary 119 (H) 70 - 99 mg/dL  Glucose, capillary  Result Value Ref Range   Glucose-Capillary 116 (H) 70 - 99 mg/dL  Glucose, capillary  Result Value Ref Range   Glucose-Capillary 107 (H) 70 - 99 mg/dL  Basic metabolic panel  Result Value Ref Range   Sodium 138 135 - 145 mmol/L   Potassium 4.2 3.5 - 5.1 mmol/L   Chloride 107 98 - 111 mmol/L   CO2 23 22 - 32 mmol/L   Glucose, Bld 101 (H) 70 - 99 mg/dL   BUN 16 6 - 20 mg/dL   Creatinine, Ser 0.78 0.61 - 1.24 mg/dL   Calcium 9.0 8.9 - 10.3 mg/dL   GFR, Estimated >60 >60 mL/min   Anion gap 8 5 - 15  Glucose, capillary  Result Value Ref Range   Glucose-Capillary 94 70 - 99 mg/dL  Glucose, capillary  Result Value Ref Range   Glucose-Capillary 100 (H) 70 - 99 mg/dL  Glucose, capillary  Result Value Ref Range   Glucose-Capillary 76 70 - 99 mg/dL  Hemoglobin and hematocrit, blood  Result Value Ref Range   Hemoglobin 13.3 13.0 - 17.0 g/dL   HCT 40.3 39.0 - 52.0 %  Glucose, capillary  Result Value Ref Range   Glucose-Capillary 110 (H) 70 - 99 mg/dL  Glucose, capillary  Result Value Ref Range   Glucose-Capillary 114 (H) 70 - 99 mg/dL  CBC  Result Value Ref Range   WBC 7.0 4.0 - 10.5 K/uL   RBC 3.35 (L) 4.22 - 5.81 MIL/uL   Hemoglobin 11.2 (L) 13.0 - 17.0 g/dL   HCT 34.1 (L) 39.0 - 52.0 %   MCV 101.8 (H) 80.0 - 100.0 fL   MCH 33.4 26.0 - 34.0 pg   MCHC 32.8 30.0 - 36.0 g/dL   RDW 12.8 11.5 - 15.5 %   Platelets 188 150 - 400 K/uL   nRBC 0.0 0.0 - 0.2 %  Glucose, capillary  Result Value Ref Range   Glucose-Capillary 161 (H) 70 - 99 mg/dL  Glucose, capillary  Result Value Ref Range   Glucose-Capillary 106 (H) 70 - 99 mg/dL  CBG monitoring, ED  Result Value Ref Range   Glucose-Capillary 86 70 - 99 mg/dL  CBG monitoring, ED  Result Value Ref Range   Glucose-Capillary 135 (H) 70 - 99 mg/dL  Type and screen  Result Value Ref Range   ABO/RH(D) O POS    Antibody  Screen NEG    Sample Expiration      09/09/2020,2359 Performed at [facility], [address] [place], [place] [address]    Lab Results  Component Value Date   WBC 7.0 09/07/2020   HGB 11.2 (L) 09/07/2020   HCT 34.1 (L) 09/07/2020   MCV 101.8 (H) 09/07/2020   PLT 188 09/07/2020    Lab Results  Component Value Date   CREATININE 0.78 09/06/2020    Lab Results  Component Value Date   HGBA1C 6.0 (H) 09/03/2020    Lab Results  Component Value Date   AST 25 09/05/2020   Lab Results  Component Value Date   ALT 33 09/05/2020  I have reviewed the labs.   Pertinent  Imaging: No recent imaging    Assessment & Plan:    1. Genital herpes Will continue Valtrex 500 mg daily  Return in about 1 year (around 12/23/2021) for recheck .  These notes generated with voice recognition software. I apologize for typographical errors.  Zara Council, PA-C  [facility] [address]  [place], [place] [address] [phone]

## 2020-12-26 ENCOUNTER — Ambulatory Visit: Payer: 59 | Admitting: Dermatology

## 2021-01-18 ENCOUNTER — Encounter: Payer: Self-pay | Admitting: Dermatology

## 2021-01-18 ENCOUNTER — Ambulatory Visit (INDEPENDENT_AMBULATORY_CARE_PROVIDER_SITE_OTHER): Payer: Self-pay | Admitting: Dermatology

## 2021-01-18 ENCOUNTER — Other Ambulatory Visit: Payer: Self-pay

## 2021-01-18 VITALS — Wt 233.0 lb

## 2021-01-18 DIAGNOSIS — Z79899 Other long term (current) drug therapy: Secondary | ICD-10-CM

## 2021-01-18 DIAGNOSIS — L732 Hidradenitis suppurativa: Secondary | ICD-10-CM

## 2021-01-18 DIAGNOSIS — L853 Xerosis cutis: Secondary | ICD-10-CM

## 2021-01-18 DIAGNOSIS — L2089 Other atopic dermatitis: Secondary | ICD-10-CM

## 2021-01-18 DIAGNOSIS — K130 Diseases of lips: Secondary | ICD-10-CM

## 2021-01-18 MED ORDER — EUCRISA 2 % EX OINT: 1.0000 "application " | TOPICAL_OINTMENT | Freq: Two times a day (BID) | CUTANEOUS | 3 refills | Status: DC

## 2021-01-18 MED ORDER — ISOTRETINOIN 30 MG PO CAPS
ORAL_CAPSULE | ORAL | 0 refills | Status: DC
Start: 2021-01-18 — End: 2021-02-22

## 2021-01-18 NOTE — Patient Instructions (Signed)

## 2021-01-18 NOTE — Progress Notes (Signed)
Isotretinoin Follow-Up Visit   Subjective  Curtis Peterson is a 45 y.o. male who presents for the following: Follow-up (Hidradenitis follow up week #16 Isotretinoin 30 mg qd - decreased due to atopic derm flare, which is better. He is still having some break outs on his scalp.).  Week # 16    Isotretinoin F/U - 01/18/21 1000      Isotretinoin Follow Up   iPledge # [phone]    Date 01/18/21    Weight 233 lb (105.7 kg)    Acne breakouts since last visit? Yes      Dosage   Target Dosage (mg) 21180    Current (To Date) Dosage (mg) 3600    To Go Dosage (mg) 17580      Side Effects   Skin Chapped Lips;Dry Eyes;Dry Lips;Dry Skin    Gastrointestinal WNL    Neurological WNL    Constitutional WNL           Side effects: Dry skin, dry lips  Denies changes in night vision, shortness of breath, abdominal pain, nausea, vomiting, diarrhea, blood in stool or urine, visual changes, headaches, epistaxis, joint pain, myalgias, mood changes, depression, or suicidal ideation.   The following portions of the chart were reviewed this encounter and updated as appropriate: medications, allergies, medical history  Review of Systems:  No other skin or systemic complaints except as noted in HPI or Assessment and Plan.  Objective  Well appearing patient in no apparent distress; mood and affect are within normal limits.  An examination of the face, neck, chest, and back was performed and relevant findings are noted below.   Objective  Scalp: Pitted scarring with a nodule of left mastoid.   Assessment & Plan   Hidradenitis suppurativa Scalp Hidradenitis Suppurativa/Acne -improving but not to goal.  Still active lesions but much better. Hidradenitis Suppurativa is a chronic; persistent; non-curable, but treatable condition due to abnormal inflamed sweat glands in the body folds (axilla, inframammary, groin, medial thighs), causing recurrent painful cysts and scarring. It can be associated  with severe scarring acne and cysts; abscesses and scarring of scalp. The goal is control and prevention of flares, as it is not curable. Scars are permanent and can be thickened. Treatment may include daily use of topical medication and oral antibiotics.  Oral isotretinoin may also be helpful.  For more severe cases, Humira (a biologic injection) may be prescribed to decrease the inflammatory process and prevent flares.  When indicated, inflamed cysts may also be treated surgically.  Severe; On Isotretinoin -  requiring FDA mandated monthly evaluations and laboratory monitoring; Chronic and Persistent; Not to Goal  Continue Isotretinoin 30 mg 1 po qd. we will keep dose low for now until gets warmer as his eczema/atopic dermatitis flared with higher doses of isotretinoin.  ISOtretinoin (ACCUTANE) 30 MG capsule - Scalp  atopic dermatitis -much better today on treatment.  It flared with isotretinoin use. Arms Atopic dermatitis (eczema) is a chronic, relapsing, pruritic condition that can significantly affect quality of life. It is often associated with allergic rhinitis and/or asthma and can require treatment with topical medications, phototherapy, or in severe cases a biologic medication called Dupixent in older children and adults.   Start Eucrisa oint qd-bid. May consider Opzelura in the future.  Crisaborole (EUCRISA) 2 % OINT - Arms   Xerosis secondary to isotretinoin therapy - Continue emollients as directed  Cheilitis secondary to isotretinoin therapy - Continue lip balm as directed, Dr. Luvenia Heller Cortibalm recommended  Long term  medication management (isotretinoin) - While taking Isotretinoin and for 30 days after you finish the medication, do not share pills, do not donate blood. Isotretinoin is best absorbed when taken with a fatty meal. Isotretinoin can make you sensitive to the sun. Daily careful sun protection including sunscreen SPF 30+ when outdoors is recommended.  Follow-up in 30  days.  I, Ashok Cordia, CMA, am acting as scribe for Sarina Ser, MD .  Documentation: I have reviewed the above documentation for accuracy and completeness, and I agree with the above.  Sarina Ser, MD

## 2021-02-22 ENCOUNTER — Other Ambulatory Visit: Payer: Self-pay

## 2021-02-22 ENCOUNTER — Ambulatory Visit (INDEPENDENT_AMBULATORY_CARE_PROVIDER_SITE_OTHER): Payer: Self-pay | Admitting: Dermatology

## 2021-02-22 VITALS — Wt 233.0 lb

## 2021-02-22 DIAGNOSIS — K130 Diseases of lips: Secondary | ICD-10-CM

## 2021-02-22 DIAGNOSIS — L732 Hidradenitis suppurativa: Secondary | ICD-10-CM

## 2021-02-22 DIAGNOSIS — L2089 Other atopic dermatitis: Secondary | ICD-10-CM

## 2021-02-22 DIAGNOSIS — L853 Xerosis cutis: Secondary | ICD-10-CM

## 2021-02-22 DIAGNOSIS — Z79899 Other long term (current) drug therapy: Secondary | ICD-10-CM

## 2021-02-22 MED ORDER — ISOTRETINOIN 40 MG PO CAPS: 40.0000 mg | ORAL_CAPSULE | Freq: Every day | ORAL | 0 refills | Status: DC

## 2021-02-22 NOTE — Progress Notes (Signed)
Called patient to re order RX due to Curtis Peterson [facility] calling and unable to fill prescription due to ipledge number. Patient has been marked inactive in ipledge. I need email on file in order to re register patient. I have called and left message to return my call.  IPledge# [id number]

## 2021-02-22 NOTE — Patient Instructions (Addendum)
If you have any questions or concerns for your doctor, please call our main line at [phone] and press option 4 to reach your doctor's medical assistant. If no one answers, please leave a voicemail as directed and we will return your call as soon as possible. Messages left after 4 pm will be answered the following business day.   You may also send [place] a message via [place]. We typically respond to MyChart messages within 1-2 business days.  For prescription refills, please ask your pharmacy to contact our office. Our fax number is [phone].  If you have an urgent issue when the clinic is closed that cannot wait until the next business day, you can page your doctor at the number below.    Please note that while we do our best to be available for urgent issues outside of office hours, we are not available 24/7.   If you have an urgent issue and are unable to reach [place], you may choose to seek medical care at your doctor's office, retail clinic, urgent care center, or emergency room.  If you have a medical emergency, please immediately call 911 or go to the emergency department.  Pager Numbers  - Dr. Nehemiah Massed: [phone]  - Dr. Laurence Ferrari: [phone]  - Dr. Nicole Kindred: [phone]  In the event of inclement weather, please call our main line at [phone] for an update on the status of any delays or closures.  Dermatology Medication Tips: Please keep the boxes that topical medications come in in order to help keep track of the instructions about where and how to use these. Pharmacies typically print the medication instructions only on the boxes and not directly on the medication tubes.   If your medication is too expensive, please contact our office at [phone] option 4 or send [place] a message through [place].   We are unable to tell what your co-pay for medications will be in advance as this is different depending on your insurance coverage. However, we may be able to find a substitute  medication at lower cost or fill out paperwork to get insurance to cover a needed medication.   If a prior authorization is required to get your medication covered by your insurance company, please allow [place] 1-2 business days to complete this process.  Drug prices often vary depending on where the prescription is filled and some pharmacies may offer cheaper prices.  The website www.goodrx.com contains coupons for medications through different pharmacies. The prices here do not account for what the cost may be with help from insurance (it may be cheaper with your insurance), but the website can give you the price if you did not use any insurance.  - You can print the associated coupon and take it with your prescription to the pharmacy.  - You may also stop by our office during regular business hours and pick up a GoodRx coupon card.  - If you need your prescription sent electronically to a different pharmacy, notify our office through [facility] or by phone at [phone] option 4.    Start Allegra 180 mg take 1 tablet once a day after a week if still itching increase to 2 tablets daily

## 2021-02-22 NOTE — Progress Notes (Signed)
Isotretinoin Follow-Up Visit   Subjective  Curtis Peterson is a 45 y.o. male who presents for the following: Follow-up. Hidradenitis suppurativa of the scalp pt taking Isotretinoin 30 mg daily with a fair response. Pt c/o irritated dry skin, eczema on his body flaring up, treating with Eucrisa ointment, Eucrisa expensive pt paying out of pocket for her medical expenses until next month he will have insurance.   Week # 20   Isotretinoin F/U - 02/23/21 1400      Isotretinoin Follow Up   iPledge # [id number]    Date 02/22/21    Weight 233 lb (105.7 kg)    Acne breakouts since last visit? Yes      Dosage   Target Dosage (mg) 21180    Current (To Date) Dosage (mg) 4500    To Go Dosage (mg) 16680      Side Effects   Skin Chapped Lips;Dry Skin    Gastrointestinal WNL    Neurological WNL    Constitutional WNL           Side effects: Dry skin, dry lips  Denies changes in night vision, shortness of breath, abdominal pain, nausea, vomiting, diarrhea, blood in stool or urine, visual changes, headaches, epistaxis, joint pain, myalgias, mood changes, depression, or suicidal ideation.   The following portions of the chart were reviewed this encounter and updated as appropriate: medications, allergies, medical history  Review of Systems:  No other skin or systemic complaints except as noted in HPI or Assessment and Plan.  Objective  Well appearing patient in no apparent distress; mood and affect are within normal limits.  An examination of the face, neck, chest, and back was performed and relevant findings are noted below.   Objective  scalp: Pitted scaring with a few nodules at left mastoid   Objective  trunk, exts: Scaly erythematous papules and patches +/- dyspigmentation, lichenification, excoriations.    Assessment & Plan   Hidradenitis suppurativa and Acne scalp  Hidradenitis suppurativa is chronic, severe, not at goal. Severe; On Isotretinoin -  requiring FDA  mandated monthly evaluations and laboratory monitoring; Chronic and Persistent; Not to Goal  While taking isotretinoin, do not share pills and do not donate blood. Isotretinoin is best absorbed when taken with a fatty meal. Isotretinoin can make you sensitive to the sun. Daily careful sun protection including sunscreen SPF 30+ when outdoors is recommended.   Increase to Absorica 40 mg take 1 tablet daily, if any side effects may decrease to 40 mg qod   Dose to date 4,500 mg/105.9kg = 43 mg/kg   Other Related Medications ISOtretinoin (ABSORICA) 40 MG capsule  Atopic dermatitis trunk, exts Flared - 2ndary to Isotretinoin medication -  Start TMC 0.1% ointment to aa's BID x 2 weeks. Then decrease use to 5d/qk. Avoid f/g/a.  Topical steroids (such as triamcinolone, fluocinolone, fluocinonide, mometasone, clobetasol, halobetasol, betamethasone, hydrocortisone) can cause thinning and lightening of the skin if they are used for too long in the same area. Your physician has selected the right strength medicine for your problem and area affected on the body. Please use your medication only as directed by your physician to prevent side effects.   Start Allegra 180 mg take 1 tablet once a day after a week if still itching   May consider Dupixent injections in the future    Xerosis secondary to isotretinoin therapy - Continue emollients as directed  Cheilitis secondary to isotretinoin therapy - Continue lip balm as directed, Dr. Luvenia Heller Cortibalm recommended  Long term medication management (isotretinoin) - While taking Isotretinoin and for 30 days after you finish the medication, do not share pills, do not donate blood. Isotretinoin is best absorbed when taken with a fatty meal. Isotretinoin can make you sensitive to the sun. Daily careful sun protection including sunscreen SPF 30+ when outdoors is recommended.  Follow-up in 30 days.  IMarye Round, CMA, am acting as scribe for Sarina Ser,  MD .  Documentation: I have reviewed the above documentation for accuracy and completeness, and I agree with the above.  Sarina Ser, MD

## 2021-03-03 ENCOUNTER — Encounter: Payer: Self-pay | Admitting: Dermatology

## 2021-03-07 ENCOUNTER — Telehealth: Payer: Self-pay

## 2021-03-07 NOTE — Telephone Encounter (Signed)
Patient already using Triamcinolone since March. Patient has had no improvement. Patient has purchased Allegra per Dr. Alveria Apley instructions but also no improvement.

## 2021-03-07 NOTE — Telephone Encounter (Signed)
Patient called in regarding Isotretinoin dose. Patient recently started 40mg  daily. Patient has since broken out in a dry/patchy rash. Patient states he itched so much over the weekend he has broken through the skin in some areas. Patient advised to decrease 1 PO qod per office note. Patient would like to know if we can prescribe anything to help with skin in the mean time?

## 2021-03-09 MED ORDER — CLOBETASOL PROPIONATE 0.05 % EX CREA: 1.0000 "application " | TOPICAL_CREAM | CUTANEOUS | 0 refills | Status: DC

## 2021-03-09 NOTE — Telephone Encounter (Signed)
Per Dr. Nicole Kindred: "I can send in Clobetasol cream qd/bid to [facility] until itchy rash cleared, but he needs to avoid applying to face, groin, axilla, body folds since it can cause skin atrophy (skin thinning, stretch marks) due to its high potency. 60 gm no rfs, Dr. Raliegh Ip can f/up with him at his next visit. "  RX sent in and patient advised.

## 2021-03-27 ENCOUNTER — Ambulatory Visit (INDEPENDENT_AMBULATORY_CARE_PROVIDER_SITE_OTHER): Payer: Self-pay | Admitting: Dermatology

## 2021-03-27 ENCOUNTER — Other Ambulatory Visit: Payer: Self-pay

## 2021-03-27 VITALS — Wt 233.0 lb

## 2021-03-27 DIAGNOSIS — L732 Hidradenitis suppurativa: Secondary | ICD-10-CM

## 2021-03-27 DIAGNOSIS — Z79899 Other long term (current) drug therapy: Secondary | ICD-10-CM

## 2021-03-27 DIAGNOSIS — K130 Diseases of lips: Secondary | ICD-10-CM

## 2021-03-27 DIAGNOSIS — L709 Acne, unspecified: Secondary | ICD-10-CM

## 2021-03-27 DIAGNOSIS — L853 Xerosis cutis: Secondary | ICD-10-CM

## 2021-03-27 DIAGNOSIS — L209 Atopic dermatitis, unspecified: Secondary | ICD-10-CM

## 2021-03-27 MED ORDER — OPZELURA 1.5 % EX CREA: 1.0000 "application " | TOPICAL_CREAM | Freq: Two times a day (BID) | CUTANEOUS | 3 refills | Status: DC

## 2021-03-27 MED ORDER — ISOTRETINOIN 30 MG PO CAPS: 30.0000 mg | ORAL_CAPSULE | Freq: Every day | ORAL | 0 refills | Status: DC

## 2021-03-27 NOTE — Patient Instructions (Signed)

## 2021-03-27 NOTE — Progress Notes (Signed)
Isotretinoin Follow-Up Visit   Subjective  Curtis Peterson is a 45 y.o. male who presents for the following: HS (Patient currently on Isotretinoin 40mg  po QD, but as soon as the dose was increased last month he experienced a significant rash on his hands, trunk, and legs. He was originally prescribed TMC 0.1% cream, but that didn't help so Dr. Nicole Kindred sent him in Clobetasol 0.05% cream BID. Patient states that even with treatment rash is persistent, itchy, and painful.).  Week # 24   Isotretinoin F/U - 03/27/21 0800       Isotretinoin Follow Up   iPledge # [id number]    Date 03/27/21    Weight 233 lb (105.7 kg)      Dosage   Target Dosage (mg) 21180    Current (To Date) Dosage (mg) 5700    To Go Dosage (mg) 15,480      Side Effects   Skin Chapped Lips;Dry Skin    Gastrointestinal WNL    Neurological WNL    Constitutional WNL    Other Side Effects rash of the trunk and extremities since increasing dose to 40mg  po QD              Side effects: Dry skin, dry lips  Denies changes in night vision, shortness of breath, abdominal pain, nausea, vomiting, diarrhea, blood in stool or urine, visual changes, headaches, epistaxis, joint pain, myalgias, mood changes, depression, or suicidal ideation.   The following portions of the chart were reviewed this encounter and updated as appropriate: medications, allergies, medical history  Review of Systems:  No other skin or systemic complaints except as noted in HPI or Assessment and Plan.  Objective  Well appearing patient in no apparent distress; mood and affect are within normal limits.  An examination of the face, neck, chest, and back was performed and relevant findings are noted below.   Scalp Active papule of the L mastoid, and scarring.   Trunk, extremities Scaly erythematous papules and patches +/- dyspigmentation, lichenification, excoriations.    Assessment & Plan   Hidradenitis suppurativa Scalp With acne -   chronic, severe, not at goal. Severe; On Isotretinoin -  requiring FDA mandated monthly evaluations and laboratory monitoring; Chronic and Persistent; Not to Goal  While taking isotretinoin, do not share pills and do not donate blood. Isotretinoin is best absorbed when taken with a fatty meal. Isotretinoin can make you sensitive to the sun. Daily careful sun protection including sunscreen SPF 30+ when outdoors is recommended.   Due to atopic dermatitis decrease Isotretinoin to 30mg  po QD.   Related Medications ISOtretinoin (ABSORICA) 40 MG capsule Take 1 capsule (40 mg total) by mouth daily.  Atopic dermatitis, Trunk, extremities Exacerbated secondary to Isotretinon therapy -  Atopic dermatitis (eczema) is a chronic, relapsing, pruritic condition that can significantly affect quality of life. It is often associated with allergic rhinitis and/or asthma and can require treatment with topical medications, phototherapy, or in severe cases a biologic medication called Dupixent in older children and adults.   Continue Clobetasol 0.05% cream to aa's BID. Topical steroids (such as triamcinolone, fluocinolone, fluocinonide, mometasone, clobetasol, halobetasol, betamethasone, hydrocortisone) can cause thinning and lightening of the skin if they are used for too long in the same area. Your physician has selected the right strength medicine for your problem and area affected on the body. Please use your medication only as directed by your physician to prevent side effects.   Start Opzelura to aa's BID  Ruxolitinib Phosphate (OPZELURA)  1.5 % CREA - Trunk, extremities Apply 1 application topically 2 (two) times daily. Apply to rash BID  ISOtretinoin (ACCUTANE) 30 MG capsule - Trunk, extremities Take 1 capsule (30 mg total) by mouth daily. Ipledge #[id number]  Related Medications triamcinolone ointment (KENALOG) 0.1 % Apply to aa's rash BID x 2 weeks then decrease use to 5d/wk. Avoid face, groin, and  axilla.   Xerosis secondary to isotretinoin therapy - Continue emollients as directed  Cheilitis secondary to isotretinoin therapy - Continue lip balm as directed, Dr. Luvenia Heller Cortibalm recommended  Long term medication management (isotretinoin) - While taking Isotretinoin and for 30 days after you finish the medication, do not share pills, do not donate blood. Isotretinoin is best absorbed when taken with a fatty meal. Isotretinoin can make you sensitive to the sun. Daily careful sun protection including sunscreen SPF 30+ when outdoors is recommended.  Follow-up in 5 weeks.  Luther Redo, CMA, am acting as scribe for Sarina Ser, MD .  Documentation: I have reviewed the above documentation for accuracy and completeness, and I agree with the above.  Sarina Ser, MD

## 2021-03-29 ENCOUNTER — Encounter: Payer: Self-pay | Admitting: Dermatology

## 2021-04-24 ENCOUNTER — Telehealth: Payer: Self-pay

## 2021-04-24 NOTE — Telephone Encounter (Signed)
Pt called did not get his medications from [facility], called [facility] Isotretinoin and Opzelura are on hold because the pt was going out of town when they contacted him, discussed with pharmacy they can fill the Isotretinoin and Opzelura

## 2021-04-25 ENCOUNTER — Other Ambulatory Visit: Payer: Self-pay

## 2021-04-25 DIAGNOSIS — L209 Atopic dermatitis, unspecified: Secondary | ICD-10-CM

## 2021-04-25 MED ORDER — ISOTRETINOIN 30 MG PO CAPS: 30.0000 mg | ORAL_CAPSULE | Freq: Every day | ORAL | 0 refills | Status: DC

## 2021-04-25 NOTE — Progress Notes (Signed)
Change of pharmacy per patient request.  

## 2021-05-01 ENCOUNTER — Ambulatory Visit (INDEPENDENT_AMBULATORY_CARE_PROVIDER_SITE_OTHER): Payer: 59 | Admitting: Dermatology

## 2021-05-01 ENCOUNTER — Encounter: Payer: Self-pay | Admitting: Dermatology

## 2021-05-01 ENCOUNTER — Other Ambulatory Visit: Payer: Self-pay

## 2021-05-01 DIAGNOSIS — L732 Hidradenitis suppurativa: Secondary | ICD-10-CM | POA: Diagnosis not present

## 2021-05-01 DIAGNOSIS — L2089 Other atopic dermatitis: Secondary | ICD-10-CM | POA: Diagnosis not present

## 2021-05-01 DIAGNOSIS — L209 Atopic dermatitis, unspecified: Secondary | ICD-10-CM | POA: Diagnosis not present

## 2021-05-01 MED ORDER — OPZELURA 1.5 % EX CREA: 1.0000 "application " | TOPICAL_CREAM | Freq: Two times a day (BID) | CUTANEOUS | 3 refills | Status: DC

## 2021-05-01 MED ORDER — ISOTRETINOIN 30 MG PO CAPS: 30.0000 mg | ORAL_CAPSULE | Freq: Every day | ORAL | 0 refills | Status: DC

## 2021-05-01 NOTE — Progress Notes (Signed)
Isotretinoin Follow-Up Visit   Subjective  Curtis Peterson is a 45 y.o. male who presents for the following: Follow-up (5 weeks f/u HS/acne on the scalp, patient report he never got his RX for Isotretinoin last month so he has been off treatment ) and Eczema (5 weeks f/u eczema on the trunk and exts related to Isotretinoin therapy treating with Opzelura with a good response ).  Week # 24  Side effects: Dry skin, dry lips  Denies changes in night vision, shortness of breath, abdominal pain, nausea, vomiting, diarrhea, blood in stool or urine, visual changes, headaches, epistaxis, joint pain, myalgias, mood changes, depression, or suicidal ideation.   The following portions of the chart were reviewed this encounter and updated as appropriate: medications, allergies, medical history  Review of Systems:  No other skin or systemic complaints except as noted in HPI or Assessment and Plan.  Objective  Well appearing patient in no apparent distress; mood and affect are within normal limits.  An examination of the face, neck, chest, and back was performed and relevant findings are noted below.   Left Abdomen (side) - Upper Scarring and violaceous plaques on the abdomen, scalp   trunk, exts Scaly erythematous papules and patches +/- dyspigmentation, lichenification, excoriations.    Assessment & Plan   Hidradenitis suppurativa Left Abdomen (side) - Upper Hidradenitis suppurativa With acne -  chronic, severe, not at goal. Severe; On Isotretinoin -  requiring FDA mandated monthly evaluations and laboratory monitoring; Chronic and Persistent; Not to Goal  While taking isotretinoin, do not share pills and do not donate blood. Isotretinoin is best absorbed when taken with a fatty meal. Isotretinoin can make you sensitive to the sun. Daily careful sun protection including sunscreen SPF 30+ when outdoors is recommended.   Due to atopic dermatitis decrease Isotretinoin to '30mg'$  po QD.    Related Medications ISOtretinoin (ABSORICA) 40 MG capsule Take 1 capsule (40 mg total) by mouth daily.   Related Medications ISOtretinoin (ACCUTANE) 30 MG capsule Take 1 capsule (30 mg total) by mouth daily. Ipledge [id number]  Other atopic dermatitis trunk, exts Exacerbated secondary to Isotretinon therapy - Atopic dermatitis (eczema) is a chronic, relapsing, pruritic condition that can significantly affect quality of life. It is often associated with allergic rhinitis and/or asthma and can require treatment with topical medications, phototherapy, or in severe cases a biologic medication called Dupixent in older children and adults.   Continue Clobetasol 0.05% cream to aa's BID. Topical steroids (such as triamcinolone, fluocinolone, fluocinonide, mometasone, clobetasol, halobetasol, betamethasone, hydrocortisone) can cause thinning and lightening of the skin if they are used for too long in the same area. Your physician has selected the right strength medicine for your problem and area affected on the body. Please use your medication only as directed by your physician to prevent side effects.   Cont Opzelura to aa's BID  Related Medications Ruxolitinib Phosphate (OPZELURA) 1.5 % CREA Apply 1 application topically 2 (two) times daily. Apply to rash BID  Atopic dermatitis, unspecified type  Related Medications triamcinolone ointment (KENALOG) 0.1 % Apply to aa's rash BID x 2 weeks then decrease use to 5d/wk. Avoid face, groin, and axilla.  ISOtretinoin (ACCUTANE) 30 MG capsule Take 1 capsule (30 mg total) by mouth daily. Ipledge [id number]  Ruxolitinib Phosphate (OPZELURA) 1.5 % CREA Apply 1 application topically 2 (two) times daily. Apply to rash BID   Xerosis secondary to isotretinoin therapy - Continue emollients as directed  Cheilitis secondary to isotretinoin therapy - Continue lip  balm as directed, Dr. Luvenia Heller Cortibalm recommended  Long term medication management  (isotretinoin) - While taking Isotretinoin and for 30 days after you finish the medication, do not share pills, do not donate blood. Isotretinoin is best absorbed when taken with a fatty meal. Isotretinoin can make you sensitive to the sun. Daily careful sun protection including sunscreen SPF 30+ when outdoors is recommended.  Follow-up in 30 days.  IMarye Round, CMA, am acting as scribe for Sarina Ser, MD .  Documentation: I have reviewed the above documentation for accuracy and completeness, and I agree with the above.  Sarina Ser, MD

## 2021-05-01 NOTE — Patient Instructions (Signed)

## 2021-05-01 NOTE — Progress Notes (Deleted)
   Isotretinoin Follow-Up Visit   Subjective  Curtis Peterson is a 45 y.o. male who presents for the following: Follow-up (5 weeks f/u HS/acne on the scalp, patient report he never got his RX for Isotretinoin last month so he has been off treatment ) and Eczema (5 weeks f/u eczema on the trunk and exts related to Isotretinoin therapy treating with Opzelura with a good response ).  Week # 24- patient was off Isotretinoin last month  Side effects: Dry skin, dry lips  Denies changes in night vision, shortness of breath, abdominal pain, nausea, vomiting, diarrhea, blood in stool or urine, visual changes, headaches, epistaxis, joint pain, myalgias, mood changes, depression, or suicidal ideation.   The following portions of the chart were reviewed this encounter and updated as appropriate: medications, allergies, medical history  Review of Systems:  No other skin or systemic complaints except as noted in HPI or Assessment and Plan.  Objective  Well appearing patient in no apparent distress; mood and affect are within normal limits.  An examination of the face, neck, chest, and back was performed and relevant findings are noted below.   Left Abdomen (side) - Upper Scarring and violaceous plaques on the abdomen, scalp   trunk, exts Scaly erythematous papules and patches +/- dyspigmentation, lichenification, excoriations.    Assessment & Plan  Xerosis secondary to isotretinoin therapy - Continue emollients as directed  Cheilitis secondary to isotretinoin therapy - Continue lip balm as directed, Dr. Luvenia Heller Cortibalm recommended  Long term medication management (isotretinoin) - While taking Isotretinoin and for 30 days after you finish the medication, do not share pills, do not donate blood. Isotretinoin is best absorbed when taken with a fatty meal. Isotretinoin can make you sensitive to the sun. Daily careful sun protection including sunscreen SPF 30+ when outdoors is  recommended.  Follow-up in 30 days.  IMarye Round, CMA, am acting as scribe for Sarina Ser, MD .

## 2021-05-04 ENCOUNTER — Emergency Department
Admission: EM | Admit: 2021-05-04 | Discharge: 2021-05-04 | Disposition: A | Discharge status: Home / Self Care | Payer: 59 | Attending: Emergency Medicine | Admitting: Emergency Medicine

## 2021-05-04 ENCOUNTER — Other Ambulatory Visit: Payer: Self-pay

## 2021-05-04 DIAGNOSIS — Z20822 Contact with and (suspected) exposure to covid-19: Secondary | ICD-10-CM | POA: Insufficient documentation

## 2021-05-04 DIAGNOSIS — E119 Type 2 diabetes mellitus without complications: Secondary | ICD-10-CM | POA: Insufficient documentation

## 2021-05-04 DIAGNOSIS — Z79899 Other long term (current) drug therapy: Secondary | ICD-10-CM | POA: Diagnosis not present

## 2021-05-04 DIAGNOSIS — R69 Illness, unspecified: Secondary | ICD-10-CM | POA: Diagnosis not present

## 2021-05-04 DIAGNOSIS — I10 Essential (primary) hypertension: Secondary | ICD-10-CM | POA: Diagnosis not present

## 2021-05-04 DIAGNOSIS — F17210 Nicotine dependence, cigarettes, uncomplicated: Secondary | ICD-10-CM | POA: Insufficient documentation

## 2021-05-04 DIAGNOSIS — H6501 Acute serous otitis media, right ear: Secondary | ICD-10-CM | POA: Insufficient documentation

## 2021-05-04 DIAGNOSIS — H6500 Acute serous otitis media, unspecified ear: Secondary | ICD-10-CM

## 2021-05-04 DIAGNOSIS — H9201 Otalgia, right ear: Secondary | ICD-10-CM | POA: Diagnosis present

## 2021-05-04 LAB — RESP PANEL BY RT-PCR (FLU A&B, COVID) ARPGX2
Influenza A by PCR: NEGATIVE
Influenza B by PCR: NEGATIVE
SARS Coronavirus 2 by RT PCR: NEGATIVE

## 2021-05-04 MED ORDER — AMOXICILLIN 875 MG PO TABS: 875.0000 mg | ORAL_TABLET | Freq: Two times a day (BID) | ORAL | 0 refills | Status: DC

## 2021-05-04 NOTE — ED Provider Notes (Signed)
[facility] Emergency Department Provider Note  ____________________________________________   Event Date/Time   First MD Initiated Contact with Patient 05/04/21 0920     (approximate)  I have reviewed the triage vital signs and the nursing notes.   HISTORY  Chief Complaint Foreign Body in Ear    HPI Curtis Peterson is a 45 y.o. male presents emergency department complaining of right ear pain that started about 3 AM this morning.  He states he thought something crawled into his ear.  His girlfriend wash the ear out with hydrogen peroxide.  States he still feels a lot of pressure and something is in his ear.  Sittings he had chills.  No fever.  Some congestion in his chest.  Denies chest pain or shortness of breath.  Past Medical History:  Diagnosis Date   Diabetes mellitus without complication ([place])    Hypertension     Patient Active Problem List   Diagnosis Date Noted   Abdominal wall cellulitis 09/03/2020   Hidradenitis    Abscess and cellulitis of gluteal region 06/30/2019   Cellulitis and abscess of buttock 06/30/2019   Abnormal LFTs (liver function tests) 06/26/2019   Benign fasciculations 06/26/2019   Benign essential hypertension 10/29/2018   Hepatic steatosis 10/29/2018   Type 2 diabetes mellitus with hyperglycemia, without long-term current use of insulin ([place]) 10/29/2018    Past Surgical History:  Procedure Laterality Date   head lesions removed  2015   HYDRADENITIS EXCISION N/A 02/26/2020   Procedure: EXCISION HIDRADENITIS, Perineum;  Surgeon: Jules Husbands, MD;  Location: [facility] ORS;  Service: General;  Laterality: N/A;   INCISION AND DRAINAGE ABSCESS N/A 09/04/2020   Procedure: INCISION AND DRAINAGE SKIN ABSCESS;  Surgeon: Olean Ree, MD;  Location: [facility] ORS;  Service: General;  Laterality: N/A;  Suprapubic area   INCISION AND DRAINAGE PERIRECTAL ABSCESS N/A 06/30/2019   Procedure: IRRIGATION AND DEBRIDEMENT PERIRECTAL ABSCESS;   Surgeon: Jules Husbands, MD;  Location: [facility] ORS;  Service: General;  Laterality: N/A;   WOUND EXPLORATION N/A 09/06/2020   Procedure: WOUND EXPLORATION;  Surgeon: Jules Husbands, MD;  Location: [facility] ORS;  Service: General;  Laterality: N/A;    Prior to Admission medications   Medication Sig Start Date End Date Taking? Authorizing Provider  amoxicillin (AMOXIL) 875 MG tablet Take 1 tablet (875 mg total) by mouth 2 (two) times daily. 05/04/21  Yes Keisa Blow, Linden Dolin, PA-C  acetaminophen (TYLENOL) 325 MG tablet Take 650 mg by mouth every 6 (six) hours as needed for moderate pain.    [provider]  clobetasol cream (TEMOVATE) [id number] % Apply 1 application topically as directed. Once to twice daily as needed for flare. Avoid face, groin and underarms. 03/09/21   Brendolyn Patty, MD  hydrOXYzine (ATARAX/VISTARIL) 10 MG tablet Take 1 tablet (10 mg total) by mouth 3 (three) times daily as needed for itching. 09/22/20   Tylene Fantasia, PA-C  ISOtretinoin (ACCUTANE) 30 MG capsule Take 1 capsule (30 mg total) by mouth daily. Ipledge [id number] 05/01/21   Ralene Bathe, MD  lisinopril (ZESTRIL) 2.5 MG tablet Take 2.5 mg by mouth daily.     [provider]  Ruxolitinib Phosphate (OPZELURA) 1.5 % CREA Apply 1 application topically 2 (two) times daily. Apply to rash BID 05/01/21   Ralene Bathe, MD  triamcinolone ointment (KENALOG) 0.1 % Apply to aa's rash BID x 2 weeks then decrease use to 5d/wk. Avoid face, groin, and axilla. 12/12/20   Sarina Ser  C, MD  valACYclovir (VALTREX) 500 MG tablet Take 1 tablet (500 mg total) by mouth daily. 12/23/20   [name], PA-C    Allergies Patient has no known allergies.  Family History  Problem Relation Age of Onset   Hypertension Other    Diabetes Mother    Diabetes Father     Social History Social History   Tobacco Use   Smoking status: Every Day    Packs/day: 0.50    Types: Cigarettes   Smokeless tobacco: Never  Substance  Use Topics   Alcohol use: No   Drug use: No    Review of Systems  Constitutional: No fever/positive for chills Eyes: No visual changes. ENT: No sore throat.  Positive for right ear pain Respiratory: Positive cough Cardiovascular: Denies chest pain Gastrointestinal: Denies abdominal pain Genitourinary: Negative for dysuria. Musculoskeletal: Negative for back pain. Skin: Negative for rash. Psychiatric: no mood changes,     ____________________________________________   PHYSICAL EXAM:  VITAL SIGNS: ED Triage Vitals  Enc Vitals Group     BP 05/04/21 0724 (!) 126/92     Pulse Rate 05/04/21 0724 77     Resp 05/04/21 0724 15     Temp 05/04/21 0724 98 F (36.7 C)     Temp Source 05/04/21 0724 Oral     SpO2 05/04/21 0724 97 %     Weight 05/04/21 0722 250 lb (113.4 kg)     Height 05/04/21 0722 '5\' 11"'$  (1.803 m)     Head Circumference --      Peak Flow --      Pain Score 05/04/21 0722 3     Pain Loc --      Pain Edu? --      Excl. in [place]? --     Constitutional: Alert and oriented. Well appearing and in no acute distress. Eyes: Conjunctivae are normal.  Head: Atraumatic. Ears right ear canal has a small amount of wax but no foreign body noted, right TM is bulging and pink to red, left TM is normal Nose: No congestion/rhinnorhea. Mouth/Throat: Mucous membranes are moist.   Neck:  supple no lymphadenopathy noted Cardiovascular: Normal rate, regular rhythm. Heart sounds are normal Respiratory: Normal respiratory effort.  No retractions, lungs c t a  GU: deferred Musculoskeletal: FROM all extremities, warm and well perfused Neurologic:  Normal speech and language.  Skin:  Skin is warm, dry and intact. No rash noted. Psychiatric: Mood and affect are normal. Speech and behavior are normal.  ____________________________________________   LABS (all labs ordered are listed, but only abnormal results are displayed)  Labs Reviewed  RESP PANEL BY RT-PCR (FLU A&B, COVID) [id number]    ____________________________________________   ____________________________________________  RADIOLOGY    ____________________________________________   PROCEDURES  Procedure(s) performed: No  Procedures    ____________________________________________   INITIAL IMPRESSION / ASSESSMENT AND PLAN / ED COURSE  Pertinent labs & imaging results that were available during my care of the patient were reviewed by me and considered in my medical decision making (see chart for details).   Patient is a 45 year old male presents emergency department with concerns of foreign body in ear.  Physical exam does not show foreign body but does show the right TM being red and swollen.  Due to the area being red and swollen we will start patient on amoxicillin.  Also will do a COVID test due to his comorbidities.  If positive he may take Paxlovid if desired. Covid test is negative, attempted to call  pt with results but there was not answer     Curtis Peterson was evaluated in Emergency Department on 05/04/2021 for the symptoms described in the history of present illness. He was evaluated in the context of the global COVID-19 pandemic, which necessitated consideration that the patient might be at risk for infection with the SARS-CoV-2 virus that causes COVID-19. Institutional protocols and algorithms that pertain to the evaluation of patients at risk for COVID-19 are in a state of rapid change based on information released by regulatory bodies including the CDC and federal and state organizations. These policies and algorithms were followed during the patient's care in the ED.    As part of my medical decision making, I reviewed the following data within the [facility] notes reviewed and incorporated, Labs reviewed , Old chart reviewed, Notes from prior ED visits, and [place] Controlled Substance Database  ____________________________________________   FINAL CLINICAL  IMPRESSION(S) / ED DIAGNOSES  Final diagnoses:  Acute serous otitis media, recurrence not specified, unspecified laterality      NEW MEDICATIONS STARTED DURING THIS VISIT:  New Prescriptions   AMOXICILLIN (AMOXIL) 875 MG TABLET    Take 1 tablet (875 mg total) by mouth 2 (two) times daily.     Note:  This document was prepared using Dragon voice recognition software and may include unintentional dictation errors.    Versie Starks, PA-C 05/04/21 1407    Arta Silence, MD 05/04/21 [phone]

## 2021-05-04 NOTE — ED Triage Notes (Signed)
Pt states "I have something stuck in my ear" , in the right ear since 3am

## 2021-05-04 NOTE — Discharge Instructions (Addendum)
Follow up with your regular doctor if not improving in 3 days Return if worsening Take the medication as prescribed

## 2021-05-22 ENCOUNTER — Other Ambulatory Visit: Payer: Self-pay

## 2021-05-22 DIAGNOSIS — L209 Atopic dermatitis, unspecified: Secondary | ICD-10-CM

## 2021-05-22 DIAGNOSIS — L732 Hidradenitis suppurativa: Secondary | ICD-10-CM

## 2021-05-22 MED ORDER — ISOTRETINOIN 30 MG PO CAPS: 30.0000 mg | ORAL_CAPSULE | Freq: Every day | ORAL | 0 refills | Status: DC

## 2021-05-22 NOTE — Progress Notes (Signed)
Patient called today regarding accutane RX. [facility], I did call to confirm RX was not picked up. RX transferred to [facility] per patient's request. Patient re confirmed in ipledge and appointment moved out another month.

## 2021-06-07 ENCOUNTER — Ambulatory Visit: Payer: 59 | Admitting: Dermatology

## 2021-06-13 DIAGNOSIS — I10 Essential (primary) hypertension: Secondary | ICD-10-CM | POA: Diagnosis not present

## 2021-06-13 DIAGNOSIS — R69 Illness, unspecified: Secondary | ICD-10-CM | POA: Diagnosis not present

## 2021-06-13 DIAGNOSIS — E1165 Type 2 diabetes mellitus with hyperglycemia: Secondary | ICD-10-CM | POA: Diagnosis not present

## 2021-06-13 DIAGNOSIS — L209 Atopic dermatitis, unspecified: Secondary | ICD-10-CM | POA: Diagnosis not present

## 2021-06-13 DIAGNOSIS — R7989 Other specified abnormal findings of blood chemistry: Secondary | ICD-10-CM | POA: Diagnosis not present

## 2021-06-13 DIAGNOSIS — Z720 Tobacco use: Secondary | ICD-10-CM | POA: Insufficient documentation

## 2021-06-13 DIAGNOSIS — R253 Fasciculation: Secondary | ICD-10-CM | POA: Diagnosis not present

## 2021-06-29 ENCOUNTER — Ambulatory Visit: Payer: 59 | Admitting: Dermatology

## 2021-07-01 ENCOUNTER — Other Ambulatory Visit: Payer: Self-pay

## 2021-07-01 ENCOUNTER — Encounter: Payer: Self-pay | Admitting: *Deleted

## 2021-07-01 ENCOUNTER — Emergency Department
Admission: EM | Admit: 2021-07-01 | Discharge: 2021-07-02 | Disposition: A | Discharge status: Home / Self Care | Payer: 59 | Attending: Emergency Medicine | Admitting: Emergency Medicine

## 2021-07-01 DIAGNOSIS — Z79899 Other long term (current) drug therapy: Secondary | ICD-10-CM | POA: Insufficient documentation

## 2021-07-01 DIAGNOSIS — Y908 Blood alcohol level of 240 mg/100 ml or more: Secondary | ICD-10-CM | POA: Diagnosis not present

## 2021-07-01 DIAGNOSIS — F10220 Alcohol dependence with intoxication, uncomplicated: Secondary | ICD-10-CM | POA: Diagnosis not present

## 2021-07-01 DIAGNOSIS — E119 Type 2 diabetes mellitus without complications: Secondary | ICD-10-CM | POA: Diagnosis not present

## 2021-07-01 DIAGNOSIS — F17210 Nicotine dependence, cigarettes, uncomplicated: Secondary | ICD-10-CM | POA: Diagnosis not present

## 2021-07-01 DIAGNOSIS — F1099 Alcohol use, unspecified with unspecified alcohol-induced disorder: Secondary | ICD-10-CM | POA: Diagnosis present

## 2021-07-01 DIAGNOSIS — I10 Essential (primary) hypertension: Secondary | ICD-10-CM | POA: Diagnosis not present

## 2021-07-01 DIAGNOSIS — R69 Illness, unspecified: Secondary | ICD-10-CM | POA: Diagnosis not present

## 2021-07-01 LAB — COMPREHENSIVE METABOLIC PANEL
ALT: 195 U/L — ABNORMAL HIGH (ref 0–44)
AST: 152 U/L — ABNORMAL HIGH (ref 15–41)
Albumin: 3.9 g/dL (ref 3.5–5.0)
Alkaline Phosphatase: 70 U/L (ref 38–126)
Anion gap: 11 (ref 5–15)
BUN: 8 mg/dL (ref 6–20)
CO2: 22 mmol/L (ref 22–32)
Calcium: 10 mg/dL (ref 8.9–10.3)
Chloride: 104 mmol/L (ref 98–111)
Creatinine, Ser: 0.63 mg/dL (ref 0.61–1.24)
GFR, Estimated: >60 mL/min (ref >60)
Glucose, Bld: 120 mg/dL — ABNORMAL HIGH (ref 70–99)
Potassium: 4.1 mmol/L (ref 3.5–5.1)
Sodium: 137 mmol/L (ref 135–145)
Total Bilirubin: 0.6 mg/dL (ref 0.3–1.2)
Total Protein: 8.9 g/dL — ABNORMAL HIGH (ref 6.5–8.1)

## 2021-07-01 LAB — CBG MONITORING, ED: Glucose-Capillary: 126 mg/dL — ABNORMAL HIGH (ref 70–99)

## 2021-07-01 LAB — CBC
HCT: 47.8 % (ref 39.0–52.0)
Hemoglobin: 16.6 g/dL (ref 13.0–17.0)
MCH: 35.3 pg — ABNORMAL HIGH (ref 26.0–34.0)
MCHC: 34.7 g/dL (ref 30.0–36.0)
MCV: 101.7 fL — ABNORMAL HIGH (ref 80.0–100.0)
Platelets: 242 10*3/uL (ref 150–400)
RBC: 4.7 MIL/uL (ref 4.22–5.81)
RDW: 14 % (ref 11.5–15.5)
WBC: 7.8 10*3/uL (ref 4.0–10.5)
nRBC: 0 % (ref 0.0–0.2)

## 2021-07-01 LAB — ETHANOL: Alcohol, Ethyl (B): 324 mg/dL (ref <10)
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
## 2021-07-01 NOTE — ED Triage Notes (Signed)
Pt wants detox from alcohol. Drinks 12 pack a day. Last drink a few hours ago. Reports he is not SI today.

## 2021-07-01 NOTE — ED Notes (Signed)
Pt sleeping, arouses to rub, but will not communicate regarding why he is here. Cont pox placed to monitor.
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
## 2021-07-02 NOTE — BH Assessment (Signed)
Comprehensive Clinical Assessment (CCA) Note  07/02/2021 Melissa Pulido [id number] Recommendations for Services/Supports/Treatments: Consulted with EDP Dr. Alfred Levins, who determined pt. can be discharged with substance abuse treatment resources, when pt is medically sober. This Probation officer provided pt with resources .   Fernandez Kenley is a 45 year old, is an [place] speaking, Hispanic/Latino male who presented to [facility] ED voluntarily, via EMS, requesting detox. Pt admitted to using alcohol on the 07/01/21. Pt is employed and lives with his significant other. Pt reported that he'd drank a 5 pack of beer and got into an argument with his significant other. Pt denies current SI, HI, AV/H, or NSSIB. Pt reported that he has no hx of substance abuse treatment. Pt reports that he is not connected to a psychiatrist or therapist and is not on any psychotropic medications. Pt denies any upcoming court dates. Pt is oriented x5. Pt expressed a desire to change, detox, and live a sober lifestyle.  Pt was drowsy with slurred speech. Pt's thought processes were relevant and coherent. Pt reported that he drinks a 5 pack of beer, daily. Pt denies all other substance use. Pt's BAL was 324 upon arrival.  Chief Complaint: No chief complaint on file.  Visit Diagnosis: Alcohol use disorder, severe    CCA Screening, Triage and Referral (STR)  Patient Reported Information How did you hear about [place]? -- (EMS)  Referral name: No data recorded Referral phone number: No data recorded  Whom do you see for routine medical problems? No data recorded Practice/Facility Name: No data recorded Practice/Facility Phone Number: No data recorded Name of Contact: No data recorded Contact Number: No data recorded Contact Fax Number: No data recorded Prescriber Name: No data recorded Prescriber Address (if known): No data recorded  What Is the Reason for Your Visit/Call Today? ETOH intoxication  How Long Has This Been Causing You  Problems? <Week  What Do You Feel Would Help You the Most Today? Alcohol or Drug Use Treatment   Have You Recently Been in Any Inpatient Treatment (Hospital/Detox/Crisis Center/28-Day Program)? No data recorded Name/Location of Program/Hospital:No data recorded How Long Were You There? No data recorded When Were You Discharged? No data recorded  Have You Ever Received Services From [facility] Before? No data recorded Who Do You See at [facility]? No data recorded  Have You Recently Had Any Thoughts About Hurting Yourself? No  Are You Planning to Commit Suicide/Harm Yourself At This time? No   Have you Recently Had Thoughts About [place]? No  Explanation: No data recorded  Have You Used Any Alcohol or Drugs in the Past 24 Hours? Yes  How Long Ago Did You Use Drugs or Alcohol? No data recorded What Did You Use and How Much? 15 beers   Do You Currently Have a Therapist/Psychiatrist? No  Name of Therapist/Psychiatrist: No data recorded  Have You Been Recently Discharged From Any Office Practice or Programs? No  Explanation of Discharge From Practice/Program: No data recorded    CCA Screening Triage Referral Assessment Type of Contact: Face-to-Face  Is this Initial or Reassessment? No data recorded Date Telepsych consult ordered in [facility]:  No data recorded Time Telepsych consult ordered in [facility]:  No data recorded  Patient Reported Information Reviewed? No data recorded Patient Left Without Being Seen? No data recorded Reason for Not Completing Assessment: No data recorded  Collateral Involvement: None provided   Does Patient Have a [place]? No data recorded Name and Contact of Legal Guardian: No data recorded  If Minor and Not Living with Parent(s), Who has Custody? No data recorded Is CPS involved or ever been involved? Never  Is APS involved or ever been involved? Never   Patient Determined To Be At Risk for Harm To Self or  Others Based on Review of Patient Reported Information or Presenting Complaint? No  Method: No data recorded Availability of Means: No data recorded Intent: No data recorded Notification Required: No data recorded Additional Information for Danger to Others Potential: No data recorded Additional Comments for Danger to Others Potential: No data recorded Are There Guns or Other Weapons in Your Home? No data recorded Types of Guns/Weapons: No data recorded Are These Weapons Safely Secured?                            No data recorded Who Could Verify You Are Able To Have These Secured: No data recorded Do You Have any Outstanding Charges, Pending Court Dates, Parole/Probation? No data recorded Contacted To Inform of Risk of Harm To Self or Others: No data recorded  Location of Assessment: [facility] ED   Does Patient Present under Involuntary Commitment? No  IVC Papers Initial File Date: No data recorded  [place] of Residence: [place]   Patient Currently Receiving the Following Services: Not Receiving Services   Determination of Need: Emergent (2 hours)   Options For Referral: Therapeutic Triage Services     CCA Biopsychosocial Intake/Chief Complaint:  No data recorded Current Symptoms/Problems: No data recorded  Patient Reported Schizophrenia/Schizoaffective Diagnosis in Past: No   Strengths: Pt has good insight; motivated for change  Preferences: No data recorded Abilities: No data recorded  Type of Services Patient Feels are Needed: No data recorded  Initial Clinical Notes/Concerns: No data recorded  Mental Health Symptoms Depression:   None   Duration of Depressive symptoms: No data recorded  Mania:   None   Anxiety:    None   Psychosis:   None   Duration of Psychotic symptoms: No data recorded  Trauma:   None   Obsessions:   None   Compulsions:   None   Inattention:   None   Hyperactivity/Impulsivity:   None   Oppositional/Defiant  Behaviors:   Argumentative   Emotional Irregularity:   None   Other Mood/Personality Symptoms:  No data recorded   Mental Status Exam Appearance and self-care  Stature:   Average   Weight:   Overweight   Clothing:   Disheveled   Grooming:   Neglected   Cosmetic use:   None   Posture/gait:   Normal   Motor activity:   Not Remarkable   Sensorium  Attention:   Normal   Concentration:   Normal   Orientation:   X5   Recall/memory:   Normal   Affect and Mood  Affect:  No data recorded  Mood:   Anxious   Relating  Eye contact:   Normal   Facial expression:   Responsive   Attitude toward examiner:   Cooperative   Thought and Language  Speech flow:  Slurred   Thought content:   Appropriate to Mood and Circumstances   Preoccupation:   None   Hallucinations:   None   Organization:  No data recorded  [employer] of Knowledge:   Average   Intelligence:   Average   Abstraction:   Normal   Judgement:   Impaired   Reality Testing:   Adequate   Insight:  Good   Decision Making:   Normal   Social Functioning  Social Maturity:   Irresponsible   Social Judgement:   Normal   Stress  Stressors:   Relationship   Coping Ability:   Programme researcher, broadcasting/film/video Deficits:   Decision making   Supports:   Support needed     Religion: Religion/Spirituality Are You A Religious Person?: Yes What is Your Religious Affiliation?: Catholic  Leisure/Recreation: Leisure / Recreation Do You Have Hobbies?: No  Exercise/Diet: Exercise/Diet Do You Exercise?: No Have You Gained or Lost A Significant Amount of Weight in the Past Six Months?: No Do You Follow a Special Diet?: No Do You Have Any Trouble Sleeping?: No   CCA Employment/Education Employment/Work Situation: Employment / Work Situation Employment Situation: Employed Patient's Job has Been Impacted by Current Illness: No Has Patient ever Been in Engineer, water?: No  Education: Education Is Patient Currently Attending School?: No Did Physicist, medical?: No Did You Have An Individualized Education Program (IIEP): No Did You Have Any Difficulty At [employer]?: No Patient's Education Has Been Impacted by Current Illness: No   CCA Family/Childhood History Family and Relationship History: Family history Marital status: Single Does patient have children?: Yes How is patient's relationship with their children?: Pt reports having a good relationship with his children  Childhood History:  Childhood History By whom was/is the patient raised?: Both parents Did patient suffer any verbal/emotional/physical/sexual abuse as a child?: No Did patient suffer from severe childhood neglect?: No Has patient ever been sexually abused/assaulted/raped as an adolescent or adult?: No Was the patient ever a victim of a crime or a disaster?: No Witnessed domestic violence?: No Has patient been affected by domestic violence as an adult?: No  Child/Adolescent Assessment:     CCA Substance Use Alcohol/Drug Use: Alcohol / Drug Use Pain Medications: See MAR Prescriptions: See MAR Over the Counter: See MAR History of alcohol / drug use?: Yes Longest period of sobriety (when/how long): Unknown Negative Consequences of Use: Personal relationships Withdrawal Symptoms: None                         ASAM's:  Six Dimensions of Multidimensional Assessment  Dimension 1:  Acute Intoxication and/or Withdrawal Potential:   Dimension 1:  Description of individual's past and current experiences of substance use and withdrawal: None reported  Dimension 2:  Biomedical Conditions and Complications:   Dimension 2:  Description of patient's biomedical conditions and  complications: Type II Diabetes  Dimension 3:  Emotional, Behavioral, or Cognitive Conditions and Complications:     Dimension 4:  Readiness to Change:     Dimension 5:  Relapse, Continued use,  or Continued Problem Potential:     Dimension 6:  Recovery/Living Environment:     ASAM Severity Score: ASAM's Severity Rating Score: 16  ASAM Recommended Level of Treatment: ASAM Recommended Level of Treatment: Level III Residential Treatment   Substance use Disorder (SUD) Substance Use Disorder (SUD)  Checklist Symptoms of Substance Use: Continued use despite having a persistent/recurrent physical/psychological problem caused/exacerbated by use  Recommendations for Services/Supports/Treatments: Recommendations for Services/Supports/Treatments Recommendations For Services/Supports/Treatments: Detox  DSM5 Diagnoses: Patient Active Problem List   Diagnosis Date Noted   Abdominal wall cellulitis 09/03/2020   Hidradenitis    Abscess and cellulitis of gluteal region 06/30/2019   Cellulitis and abscess of buttock 06/30/2019   Abnormal LFTs (liver function tests) 06/26/2019   Benign fasciculations 06/26/2019   Benign essential hypertension 10/29/2018  Hepatic steatosis 10/29/2018   Type 2 diabetes mellitus with hyperglycemia, without long-term current use of insulin ([place]) 10/29/2018    Gabrielle Wakeland R [place], LCAS

## 2021-07-02 NOTE — ED Notes (Signed)
Pt resting, cont pox in place. Pt was provided earlier with outpatient resources for etoh detox by TTS.

## 2021-07-02 NOTE — ED Notes (Signed)
Pt ambulatory out of department steadily wihtout difficulty to parking lot. Caught pt in parking lot. Pt states he is going to walk home. Pt informed "let's get you officially discharged and I will work on getting you a taxi home" as pt relates he has no one to come and get him. Pt agrees and ambulates back into department with this RN.

## 2021-07-02 NOTE — ED Notes (Signed)
Pt in hallway bed in no acute distress, texting on phone.

## 2021-07-02 NOTE — Discharge Instructions (Addendum)
Return to the Emergency Department immediately if:  You have thoughts about hurting yourself or others. You have serious withdrawal symptoms, including: Confusion. Racing heart. High blood pressure. Fever. If you ever feel like you may hurt yourself or others, or have thoughts about taking your own life, get help right away. You can go to your nearest emergency department or call: Your local emergency services (911 in the [place] A suicide crisis helpline, such as the [facility] at [phone]. This is open 24 hours a day.

## 2021-07-19 ENCOUNTER — Encounter: Payer: Self-pay | Admitting: Physician Assistant

## 2021-07-19 ENCOUNTER — Ambulatory Visit (INDEPENDENT_AMBULATORY_CARE_PROVIDER_SITE_OTHER): Payer: 59 | Admitting: Physician Assistant

## 2021-07-19 ENCOUNTER — Other Ambulatory Visit: Payer: Self-pay

## 2021-07-19 VITALS — BP 124/87 | HR 87 | Temp 98.4°F | Ht 71.0 in | Wt 221.0 lb

## 2021-07-19 DIAGNOSIS — L03311 Cellulitis of abdominal wall: Secondary | ICD-10-CM | POA: Diagnosis not present

## 2021-07-19 MED ORDER — SULFAMETHOXAZOLE-TRIMETHOPRIM 800-160 MG PO TABS: 1.0000 | ORAL_TABLET | Freq: Two times a day (BID) | ORAL | 0 refills | Status: AC

## 2021-07-19 NOTE — Patient Instructions (Signed)
Follow up here next week We will send you in a prescription for antibiotics  Keep a dry gauze over the area while it is draining. Change this every day after your shower.

## 2021-07-19 NOTE — Progress Notes (Signed)
[facility] NOTE  07/19/2021  History of Present Illness: Curtis Peterson is a 45 y.o. male  well known to our service following multiple admissions and I&Ds for hidradenitis most recently in December of 2021. He has done well in the interim. He presents today with concerns over another possible abscess. He states that about 24 hours ago he was itching his lower abdomen and thought he may have accidentally opened one of his scars. He reports there has been some drainage from there as well. From what he shows me, this looks serosanguinous. He denied any fever, chills. No other complaints. Given his history he is reasonably concerned and wants to make sure he does not have any worsening or recurrence. No other complaints.  Past Medical History: Past Medical History:  Diagnosis Date   Diabetes mellitus without complication ([place])    Hypertension      Past Surgical History: Past Surgical History:  Procedure Laterality Date   head lesions removed  2015   HYDRADENITIS EXCISION N/A 02/26/2020   Procedure: EXCISION HIDRADENITIS, Perineum;  Surgeon: Jules Husbands, MD;  Location: [facility] ORS;  Service: General;  Laterality: N/A;   INCISION AND DRAINAGE ABSCESS N/A 09/04/2020   Procedure: INCISION AND DRAINAGE SKIN ABSCESS;  Surgeon: Olean Ree, MD;  Location: [facility] ORS;  Service: General;  Laterality: N/A;  Suprapubic area   INCISION AND DRAINAGE PERIRECTAL ABSCESS N/A 06/30/2019   Procedure: IRRIGATION AND DEBRIDEMENT PERIRECTAL ABSCESS;  Surgeon: Jules Husbands, MD;  Location: [facility] ORS;  Service: General;  Laterality: N/A;   WOUND EXPLORATION N/A 09/06/2020   Procedure: WOUND EXPLORATION;  Surgeon: Jules Husbands, MD;  Location: [facility] ORS;  Service: General;  Laterality: N/A;    Home Medications: Prior to Admission medications   Medication Sig Start Date End Date Taking? Authorizing Provider  acetaminophen (TYLENOL) 325 MG tablet Take 650 mg by mouth every 6 (six)  hours as needed for moderate pain.   Yes [provider]  clobetasol cream (TEMOVATE) 1.61 % Apply 1 application topically as directed. Once to twice daily as needed for flare. Avoid face, groin and underarms. 03/09/21  Yes Brendolyn Patty, MD  hydrOXYzine (ATARAX/VISTARIL) 10 MG tablet Take 1 tablet (10 mg total) by mouth 3 (three) times daily as needed for itching. 09/22/20  Yes Tylene Fantasia, PA-C  ISOtretinoin (ACCUTANE) 30 MG capsule Take 1 capsule (30 mg total) by mouth daily. Ipledge #[id number] 05/22/21  Yes Ralene Bathe, MD  lisinopril (ZESTRIL) 2.5 MG tablet Take 2.5 mg by mouth daily.    Yes [provider]  Ruxolitinib Phosphate (OPZELURA) 1.5 % CREA Apply 1 application topically 2 (two) times daily. Apply to rash BID 05/01/21  Yes Ralene Bathe, MD  triamcinolone ointment (KENALOG) 0.1 % Apply to aa's rash BID x 2 weeks then decrease use to 5d/wk. Avoid face, groin, and axilla. 12/12/20  Yes Ralene Bathe, MD  valACYclovir (VALTREX) 500 MG tablet Take 1 tablet (500 mg total) by mouth daily. 12/23/20  Yes McGowan, Larene Beach A, PA-C    Allergies: No Known Allergies  Review of Systems: Review of Systems  Constitutional:  Negative for chills and fever.  Respiratory:  Negative for cough and sputum production.   Cardiovascular:  Negative for chest pain and palpitations.  Gastrointestinal:  Negative for abdominal pain, nausea and vomiting.  Skin:        + Possible Abscess  All other systems reviewed and are negative.  Physical Exam BP 124/87  Pulse 87   Temp 98.4 F (36.9 C)   Ht 5\' 11"  (1.803 m)   Wt 221 lb (100.2 kg)   SpO2 99%   BMI 30.82 kg/m   Physical Exam Vitals and nursing note reviewed. Exam conducted with a chaperone present.  Constitutional:      General: He is not in acute distress.    Appearance: Normal appearance. He is obese. He is not ill-appearing.  HENT:     Head: Normocephalic and atraumatic.     Mouth/Throat:     Mouth:  Mucous membranes are moist.     Pharynx: Oropharynx is clear.  Pulmonary:     Effort: Pulmonary effort is normal. No respiratory distress.  Abdominal:     General: There is no distension.     Palpations: Abdomen is soft.     Tenderness: There is no abdominal tenderness.  Genitourinary:    Comments: Deferred Skin:    Comments: He has multiple areas of scarring to his lower abdomen and suprapubic region consistent with his history of hidradenitis and previous I&Ds. There is a very small opening in ne of these scars centrally. I am able to express a small amount of serosanguinous drainage from this area. There is no gross purulence. No erythema. No evidence of necrosis.   Neurological:     General: No focal deficit present.     Mental Status: He is alert and oriented to person, place, and time.  Psychiatric:        Mood and Affect: Mood normal.        Behavior: Behavior normal.   Small <5 mm opening to one of his previous suprapubic incisions, there is a small amount of drainage, no erythema Remaining incisions have scarred appropriately    Labs/Imaging: No new pertinent labs or imaging   Assessment and Plan: This is a 45 y.o. male with a well known history of recurrent abscesses who presents with drainage from one of his scars. This does not look overtly infected and there does not appear to be any undrained underlying abscess(es). However, given his history, I will send him with 7 days of Bactrim. Previous wound cultures have grown MSSA and I will treat him as such. He can use superficial dressings prn. I will see him back in 1 week for reassessment. He understands to call sooner should things worsen or fail to improve.   Face-to-face time spent with the patient and care providers was 20 minutes, with more than 50% of the time spent counseling, educating, and coordinating care of the patient.     Edison Simon, PA-C [facility] 07/19/2021, 2:33  PM [phone] M-F: 7am - 4pm

## 2021-07-28 ENCOUNTER — Encounter: Payer: Self-pay | Admitting: Physician Assistant

## 2021-07-28 ENCOUNTER — Ambulatory Visit: Payer: 59 | Admitting: Physician Assistant

## 2021-08-22 ENCOUNTER — Other Ambulatory Visit: Payer: Self-pay | Admitting: Dermatology

## 2021-08-22 DIAGNOSIS — L209 Atopic dermatitis, unspecified: Secondary | ICD-10-CM

## 2021-09-13 ENCOUNTER — Ambulatory Visit: Payer: Self-pay | Admitting: Dermatology

## 2021-10-18 ENCOUNTER — Ambulatory Visit: Payer: Self-pay | Admitting: Dermatology

## 2021-10-23 ENCOUNTER — Other Ambulatory Visit: Payer: Self-pay

## 2021-10-23 ENCOUNTER — Ambulatory Visit (INDEPENDENT_AMBULATORY_CARE_PROVIDER_SITE_OTHER): Payer: Self-pay | Admitting: Dermatology

## 2021-10-23 VITALS — Wt 221.0 lb

## 2021-10-23 DIAGNOSIS — L732 Hidradenitis suppurativa: Secondary | ICD-10-CM

## 2021-10-23 DIAGNOSIS — L308 Other specified dermatitis: Secondary | ICD-10-CM

## 2021-10-23 MED ORDER — DUPILUMAB 300 MG/2ML ~~LOC~~ SOSY
600.0000 mg | PREFILLED_SYRINGE | Freq: Once | SUBCUTANEOUS | Status: AC
  Administered 2021-10-23: 600 mg via SUBCUTANEOUS

## 2021-10-23 NOTE — Patient Instructions (Signed)

## 2021-10-23 NOTE — Progress Notes (Signed)
Follow-Up Visit   Subjective  Curtis Peterson is a 46 y.o. male who presents for the following: Follow-up (6 months f/u Hidradenitis suppurativa on his scalp flaring up, pt ran out of Isotretinoin 2 months ago ) and Rash (Eczema flare up ).   Isotretinoin F/U - 10/23/21 1700       Isotretinoin Follow Up   iPledge # [id number]    Date 10/23/21    Weight 221 lb (100.2 kg)      Dosage   Target Dosage (mg) 21180    Current (To Date) Dosage (mg) 7500    To Go Dosage (mg) 13680      Side Effects   Skin Dry Skin              The following portions of the chart were reviewed this encounter and updated as appropriate:   Tobacco   Allergies   Meds   Problems   Med Hx   Surg Hx   Fam Hx      Review of Systems:  No other skin or systemic complaints except as noted in HPI or Assessment and Plan.  Objective  Well appearing patient in no apparent distress; mood and affect are within normal limits.  A focused examination was performed including face. Relevant physical exam findings are noted in the Assessment and Plan.  trunk, exts Scaly erythematous papules and patches +/- dyspigmentation, lichenification, excoriations.                        posterior scalp Scarring and violaceous plaques on the abdomen, scalp      Assessment & Plan  Eczema /atopic dermatitis, severe with pruritus and excoriations - flared with cooler weather.   Trunk, exts  No improvement on current treatment Clobetasol cream and Opzelura cream   Atopic dermatitis (eczema) is a chronic, relapsing, pruritic condition that can significantly affect quality of life. It is often associated with allergic rhinitis and/or asthma and can require treatment with topical medications, phototherapy, or in severe cases biologic injectable medication (Dupixent; Adbry) or Oral JAK inhibitors.    Dupixent injected to the right arm and left arm, pt tolerated injections well  Lot# [id number] Exp  08/09/2023  BSA 25%  Pt will get health insurance coverage in about 30 days, then we will send in the rx for Dupixent   Related Medications dupilumab (DUPIXENT) prefilled syringe 600 mg  Hidradenitis suppurativa -recently flared after being off of isotretinoin.  Overall he still better than previous to being on isotretinoin.  We will consider restarting isotretinoin or Humira in the future when his severe atopic dermatitis improves. He has not yet achieved ideal total dosing. posterior scalp Hidradenitis Suppurativa is a chronic; persistent; non-curable, but treatable condition due to abnormal inflamed sweat glands in the body folds (axilla, inframammary, groin, medial thighs), causing recurrent painful draining cysts and scarring. It can be associated with severe scarring acne and cysts; also abscesses and scarring of scalp. The goal is control and prevention of flares, as it is not curable. Scars are permanent and can be thickened. Treatment may include daily use of topical medication and oral antibiotics.  Oral isotretinoin may also be helpful.  For more severe cases, Humira (a biologic injection) may be prescribed to decrease the inflammatory process and prevent flares.  When indicated, inflamed cysts may also be treated surgically.   We will restart Isotretinoin until we can get the Eczema under control.  Total Isotretinoin  taken 7,500  Total Isotretinoin mg/kg taken 75 mg/kg   Return in about 2 weeks (around 11/06/2021) for [place].  IMarye Round, CMA, am acting as scribe for Sarina Ser, MD .  Documentation: I have reviewed the above documentation for accuracy and completeness, and I agree with the above.  Sarina Ser, MD

## 2021-10-24 ENCOUNTER — Encounter: Payer: Self-pay | Admitting: Dermatology

## 2021-11-06 ENCOUNTER — Other Ambulatory Visit: Payer: Self-pay

## 2021-11-06 ENCOUNTER — Ambulatory Visit (INDEPENDENT_AMBULATORY_CARE_PROVIDER_SITE_OTHER): Payer: Self-pay | Admitting: Dermatology

## 2021-11-06 DIAGNOSIS — L309 Dermatitis, unspecified: Secondary | ICD-10-CM

## 2021-11-06 MED ORDER — DUPILUMAB 300 MG/2ML ~~LOC~~ SOSY
300.0000 mg | PREFILLED_SYRINGE | Freq: Once | SUBCUTANEOUS | Status: AC
  Administered 2021-11-06: 300 mg via SUBCUTANEOUS

## 2021-11-06 NOTE — Progress Notes (Signed)
° °  Follow-Up Visit   Subjective  Curtis Peterson is a 46 y.o. male who presents for the following: Follow-up (Patient here today for 2 week follow up on eczema / atopic dermatitis of trunk and extremities. Patient has just started dupixent. Patient reports he is starting to develop eczema at eyelids. ). The following portions of the chart were reviewed this encounter and updated as appropriate:  Tobacco   Allergies   Meds   Problems   Med Hx   Surg Hx   Fam Hx      Review of Systems: No other skin or systemic complaints except as noted in HPI or Assessment and Plan.  Objective  Well appearing patient in no apparent distress; mood and affect are within normal limits.  A focused examination was performed including trunk, extremities, face. Relevant physical exam findings are noted in the Assessment and Plan.  eyelids, trunk, and extremities Scaly erythematous papules and patches +/- dyspigmentation, lichenification, excoriations.    Assessment & Plan  Eczema eyelids, trunk, and extremities Eczema /atopic dermatitis,  Has improved when compared to prior photos.  Patient has less itching and is pleased Atopic dermatitis (eczema) is a chronic, relapsing, pruritic condition that can significantly affect quality of life. It is often associated with allergic rhinitis and/or asthma and can require treatment with topical medications, phototherapy, or in severe cases biologic injectable medication (Dupixent; Adbry) or Oral JAK inhibitors.    Dupixent injected to the left upper arm. Patient tolerated well.  Lot# [id number] Exp 10/09/2023   BSA 25%  Pt will get health insurance coverage, then we will send in the rx for Dupixent   dupilumab (DUPIXENT) prefilled syringe 300 mg - eyelids, trunk, and extremities  Return for 2 week dupixent follow up . IRuthell Rummage, CMA, am acting as scribe for Sarina Ser, MD. Documentation: I have reviewed the above documentation for accuracy and completeness,  and I agree with the above.  Sarina Ser, MD

## 2021-11-06 NOTE — Patient Instructions (Signed)

## 2021-11-07 ENCOUNTER — Encounter: Payer: Self-pay | Admitting: Dermatology

## 2021-11-22 ENCOUNTER — Telehealth: Payer: Self-pay

## 2021-11-22 ENCOUNTER — Ambulatory Visit: Payer: Self-pay | Admitting: Dermatology

## 2021-11-22 NOTE — Telephone Encounter (Signed)
Left message on voicemail to contact our office. We are currently out of [place] samples and we need to r/s his appointment for today.

## 2021-11-27 ENCOUNTER — Ambulatory Visit: Payer: Self-pay

## 2021-12-05 ENCOUNTER — Ambulatory Visit: Payer: Self-pay | Admitting: Dermatology

## 2021-12-07 ENCOUNTER — Ambulatory Visit: Payer: Self-pay | Admitting: Dermatology

## 2021-12-26 ENCOUNTER — Other Ambulatory Visit: Payer: Self-pay

## 2021-12-26 ENCOUNTER — Encounter: Payer: Self-pay | Admitting: Dermatology

## 2021-12-26 ENCOUNTER — Ambulatory Visit (INDEPENDENT_AMBULATORY_CARE_PROVIDER_SITE_OTHER): Payer: 59 | Admitting: Dermatology

## 2021-12-26 DIAGNOSIS — Z79899 Other long term (current) drug therapy: Secondary | ICD-10-CM | POA: Diagnosis not present

## 2021-12-26 DIAGNOSIS — L2089 Other atopic dermatitis: Secondary | ICD-10-CM | POA: Diagnosis not present

## 2021-12-26 DIAGNOSIS — L732 Hidradenitis suppurativa: Secondary | ICD-10-CM | POA: Diagnosis not present

## 2021-12-26 MED ORDER — DUPIXENT 300 MG/2ML ~~LOC~~ SOSY: 300.0000 mg | PREFILLED_SYRINGE | SUBCUTANEOUS | 5 refills | Status: DC

## 2021-12-26 MED ORDER — DUPILUMAB 300 MG/2ML ~~LOC~~ SOSY
300.0000 mg | PREFILLED_SYRINGE | Freq: Once | SUBCUTANEOUS | Status: AC
  Administered 2021-12-26: 300 mg via SUBCUTANEOUS

## 2021-12-26 NOTE — Progress Notes (Signed)
? ?  Follow-Up Visit ?  ?Subjective  ?Curtis Peterson is a 46 y.o. male who presents for the following: Eczema (Recheck. Here for Dupixent injection. Has not had injection in 1 month. AD is flaring and itching. Does have insurance now). ? ?The following portions of the chart were reviewed this encounter and updated as appropriate:  Tobacco  Allergies  Meds  Problems  Med Hx  Surg Hx  Fam Hx   ?  ?Review of Systems: No other skin or systemic complaints except as noted in HPI or Assessment and Plan. ? ?Objective  ?Well appearing patient in no apparent distress; mood and affect are within normal limits. ? ?A focused examination was performed including face, arms, legs, torso. Relevant physical exam findings are noted in the Assessment and Plan. ? ?Left Forearm - Anterior ?Scaly erythematous papules and patches +/- dyspigmentation, lichenification, excoriations.  ? ?Right Axilla ?Pitted scarring ? ? ?Assessment & Plan  ?atopic dermatitis - severe ?All over skin ?Chronic and persistent condition with duration or expected duration over one year. Condition is bothersome/symptomatic for patient. Currently flared. ?Atopic dermatitis - Severe, on Dupixent (biologic medication).  ?Atopic dermatitis (eczema) is a chronic, relapsing, pruritic condition that can significantly affect quality of life. It is often associated with allergic rhinitis and/or asthma and can require treatment with topical medications, phototherapy, or in severe cases a biologic medication called Dupixent, which requires long term medication management.   ? ?Atopic dermatitis (eczema) is a chronic, relapsing, pruritic condition that can significantly affect quality of life. It is often associated with allergic rhinitis and/or asthma and can require treatment with topical medications, phototherapy, or in severe cases biologic injectable medication (Dupixent; Adbry) or Oral JAK inhibitors. ? ?Dupixent injected to the right upper arm. Patient tolerated  well.  ?Lot# [id number] ?Exp 10/09/2023 ?  ?BSA 25%  ? ?Will submit to insurance today. ? ?dupilumab (DUPIXENT) 300 MG/2ML prefilled syringe - Left Forearm - Anterior ?Inject 300 mg into the skin every 14 (fourteen) days. Starting at day 15 for maintenance. ? ?dupilumab (DUPIXENT) prefilled syringe 300 mg - Left Forearm - Anterior ? ?Related Medications ?Ruxolitinib Phosphate (OPZELURA) 1.5 % CREA ?Apply 1 application topically 2 (two) times daily. Apply to rash BID ? ?Hidradenitis suppurativa ?Right Axilla ?Consider restarting Isotretinoin this summer.  ?D/Ced before ideal dosing achieved due to excessive dryness and caused AD flare. ? ?Return in about 2 weeks (around 01/09/2022) for Dupixent Follow Up and injection, with Dr. Nehemiah Massed. ? ?I, Emelia Salisbury, CMA, am acting as scribe for Sarina Ser, MD. ?Documentation: I have reviewed the above documentation for accuracy and completeness, and I agree with the above. ? ?Sarina Ser, MD ? ? ?

## 2021-12-26 NOTE — Patient Instructions (Addendum)
 Dupilumab (Dupixent) is a treatment given by injection for adults and children with moderate-to-severe atopic dermatitis. Goal is control of skin condition, not cure. It is given as 2 injections at the first dose followed by 1 injection ever 2 weeks thereafter.  Young children are dosed monthly.  Potential side effects include allergic reaction, herpes infections, injection site reactions and conjunctivitis (inflammation of the eyes).  The use of Dupixent requires long term medication management, including periodic office visits.;   If You Need Anything After Your Visit  If you have any questions or concerns for your doctor, please call our main line at [phone] and press option 4 to reach your doctor's medical assistant. If no one answers, please leave a voicemail as directed and we will return your call as soon as possible. Messages left after 4 pm will be answered the following business day.   You may also send us a message via MyChart. We typically respond to MyChart messages within 1-2 business days.  For prescription refills, please ask your pharmacy to contact our office. Our fax number is [phone].  If you have an urgent issue when the clinic is closed that cannot wait until the next business day, you can page your doctor at the number below.    Please note that while we do our best to be available for urgent issues outside of office hours, we are not available 24/7.   If you have an urgent issue and are unable to reach us, you may choose to seek medical care at your doctor's office, retail clinic, urgent care center, or emergency room.  If you have a medical emergency, please immediately call 911 or go to the emergency department.  Pager Numbers  - Dr. Kowalski: [phone]  - Dr. Moye: [phone]  - Dr. Stewart: [phone]  In the event of inclement weather, please call our main line at [phone] for an update on the status of any delays or  closures.  Dermatology Medication Tips: Please keep the boxes that topical medications come in in order to help keep track of the instructions about where and how to use these. Pharmacies typically print the medication instructions only on the boxes and not directly on the medication tubes.   If your medication is too expensive, please contact our office at [phone] option 4 or send us a message through MyChart.   We are unable to tell what your co-pay for medications will be in advance as this is different depending on your insurance coverage. However, we may be able to find a substitute medication at lower cost or fill out paperwork to get insurance to cover a needed medication.   If a prior authorization is required to get your medication covered by your insurance company, please allow us 1-2 business days to complete this process.  Drug prices often vary depending on where the prescription is filled and some pharmacies may offer cheaper prices.  The website www.goodrx.com contains coupons for medications through different pharmacies. The prices here do not account for what the cost may be with help from insurance (it may be cheaper with your insurance), but the website can give you the price if you did not use any insurance.  - You can print the associated coupon and take it with your prescription to the pharmacy.  - You may also stop by our office during regular business hours and pick up a GoodRx coupon card.  - If you need your prescription sent electronically to a different   pharmacy, notify our office through [facility] MyChart or by phone at [phone] option 4.     Si Usted Necesita Algo Despus de Su Visita  Tambin puede enviarnos un mensaje a travs de MyChart. Por lo general respondemos a los mensajes de MyChart en el transcurso de 1 a 2 das hbiles.  Para renovar recetas, por favor pida a su farmacia que se ponga en contacto con nuestra oficina. Nuestro nmero de fax  es el [phone].  Si tiene un asunto urgente cuando la clnica est cerrada y que no puede esperar hasta el siguiente da hbil, puede llamar/localizar a su doctor(a) al nmero que aparece a continuacin.   Por favor, tenga en cuenta que aunque hacemos todo lo posible para estar disponibles para asuntos urgentes fuera del horario de oficina, no estamos disponibles las 24 horas del da, los 7 das de la semana.   Si tiene un problema urgente y no puede comunicarse con nosotros, puede optar por buscar atencin mdica  en el consultorio de su doctor(a), en una clnica privada, en un centro de atencin urgente o en una sala de emergencias.  Si tiene una emergencia mdica, por favor llame inmediatamente al 911 o vaya a la sala de emergencias.  Nmeros de bper  - Dr. Kowalski: [phone]  - Dra. Moye: [phone]  - Dra. Stewart: [phone]  En caso de inclemencias del tiempo, por favor llame a nuestra lnea principal al [phone] para una actualizacin sobre el estado de cualquier retraso o cierre.  Consejos para la medicacin en dermatologa: Por favor, guarde las cajas en las que vienen los medicamentos de uso tpico para ayudarle a seguir las instrucciones sobre dnde y cmo usarlos. Las farmacias generalmente imprimen las instrucciones del medicamento slo en las cajas y no directamente en los tubos del medicamento.   Si su medicamento es muy caro, por favor, pngase en contacto con nuestra oficina llamando al [phone] y presione la opcin 4 o envenos un mensaje a travs de MyChart.   No podemos decirle cul ser su copago por los medicamentos por adelantado ya que esto es diferente dependiendo de la cobertura de su seguro. Sin embargo, es posible que podamos encontrar un medicamento sustituto a menor costo o llenar un formulario para que el seguro cubra el medicamento que se considera necesario.   Si se requiere una autorizacin previa para que su compaa de seguros cubra  su medicamento, por favor permtanos de 1 a 2 das hbiles para completar este proceso.  Los precios de los medicamentos varan con frecuencia dependiendo del lugar de dnde se surte la receta y alguna farmacias pueden ofrecer precios ms baratos.  El sitio web www.goodrx.com tiene cupones para medicamentos de diferentes farmacias. Los precios aqu no tienen en cuenta lo que podra costar con la ayuda del seguro (puede ser ms barato con su seguro), pero el sitio web puede darle el precio si no utiliz ningn seguro.  - Puede imprimir el cupn correspondiente y llevarlo con su receta a la farmacia.  - Tambin puede pasar por nuestra oficina durante el horario de atencin regular y recoger una tarjeta de cupones de GoodRx.  - Si necesita que su receta se enve electrnicamente a una farmacia diferente, informe a nuestra oficina a travs de MyChart de [facility] o por telfono llamando al [phone] y presione la opcin 4.  

## 2021-12-27 ENCOUNTER — Ambulatory Visit (INDEPENDENT_AMBULATORY_CARE_PROVIDER_SITE_OTHER): Payer: 59 | Admitting: Urology

## 2021-12-27 ENCOUNTER — Encounter: Payer: Self-pay | Admitting: Urology

## 2021-12-27 VITALS — BP 131/86 | HR 76 | Temp 98.0°F | Ht 71.0 in | Wt 228.0 lb

## 2021-12-27 DIAGNOSIS — A6002 Herpesviral infection of other male genital organs: Secondary | ICD-10-CM | POA: Diagnosis not present

## 2021-12-27 DIAGNOSIS — R69 Illness, unspecified: Secondary | ICD-10-CM | POA: Diagnosis not present

## 2021-12-27 MED ORDER — VALACYCLOVIR HCL 500 MG PO TABS: 500.0000 mg | ORAL_TABLET | Freq: Every day | ORAL | 3 refills | Status: DC

## 2021-12-27 NOTE — Progress Notes (Signed)
? ? ?12/27/2021 ?10:30 AM  ? ?[place] ?1975/11/24 ?[id number] ? ?Referring provider: Tracie Harrier, MD ?[place] ?[facility] ?[place],  [place] [address] ? ?Chief Complaint  ?Patient presents with  ? Follow-up  ? 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 ? ?HPI: ?Curtis Peterson is a 46 y.o. male who presents today for a yearly recheck with his SO, Curtis Peterson.   ? ?He has had no herpes breakouts over the last year ?His SO, Curtis Peterson, is wanting to start suppressive therapy ?He has not issues with urination ?Patient denies any modifying or aggravating factors.  Patient denies any gross hematuria, dysuria or suprapubic/flank pain.  Patient denies any fevers, chills, nausea or vomiting.   ?He has no issues with erections ?Patient still having spontaneous erections.  He denies any pain or curvature with erections.   ? ?PMH: ?Past Medical History:  ?Diagnosis Date  ? Diabetes mellitus without complication ([place])   ? Hypertension   ? ? ?Surgical History: ?Past Surgical History:  ?Procedure Laterality Date  ? head lesions removed  2015  ? HYDRADENITIS EXCISION N/A 02/26/2020  ? Procedure: EXCISION HIDRADENITIS, Perineum;  Surgeon: Jules Husbands, MD;  Location: [facility] ORS;  Service: General;  Laterality: N/A;  ? INCISION AND DRAINAGE ABSCESS N/A 09/04/2020  ? Procedure: INCISION AND DRAINAGE SKIN ABSCESS;  Surgeon: Olean Ree, MD;  Location: [facility] ORS;  Service: General;  Laterality: N/A;  Suprapubic area  ? INCISION AND DRAINAGE PERIRECTAL ABSCESS N/A 06/30/2019  ? Procedure: IRRIGATION AND DEBRIDEMENT PERIRECTAL ABSCESS;  Surgeon: Jules Husbands, MD;  Location: [facility] OR[facility];  Service: General;  Laterality: N/A;  ? WOUND EXPLORATION N/A 09/06/2020  ? Procedure: WOUND EXPLORATION;  Surgeon: Jules Husbands, MD;  Location: [facility] OR[facility];  Service: General;  Laterality: N/A;  ? ? ?Home Medications:  ?Allergies as of 12/27/2021   ?No Known Allergies ?  ? ?  ?Medication List  ?  ? ?   ? Accurate as of December 27, 2021 10:30 AM. If you have any questions, ask your nurse or doctor.  ?  ?  ? ?  ? ?acetaminophen 325 MG tablet ?Commonly known as: TYLENOL ?Take 650 mg by mouth every 6 (six) hours as needed for moderate pain. ?  ?clobetasol cream 0.05 % ?Commonly known as: TEMOVATE ?Apply 1 application topically as directed. Once to twice daily as needed for flare. Avoid face, groin and underarms. ?  ?Dupixent 300 MG/2ML prefilled syringe ?Generic drug: dupilumab ?Inject 300 mg into the skin every 14 (fourteen) days. Starting at day 15 for maintenance. ?  ?hydrOXYzine 10 MG tablet ?Commonly known as: ATARAX ?Take 1 tablet (10 mg total) by mouth 3 (three) times daily as needed for itching. ?  ?lisinopril 2.5 MG tablet ?Commonly known as: ZESTRIL ?Take 2.5 mg by mouth daily. ?  ?Opzelura 1.5 % Crea ?Generic drug: Ruxolitinib Phosphate ?Apply 1 application topically 2 (two) times daily. Apply to rash BID ?  ?triamcinolone ointment 0.1 % ?Commonly known as: KENALOG ?APPLY TO THE AFFECTED AREA TWICE DAILY FOR 2 WEEKS THEN DECREASE TO 5 DAYS A WEEK. AVOID FACE, GROIN, AND AXILLA ?  ?valACYclovir 500 MG tablet ?Commonly known as: VALTREX ?Take 1 tablet (500 mg total) by mouth daily. ?  ? ?  ? ? ?Allergies: No Known Allergies ? ?Family History: ?Family History  ?Problem Relation Age of Onset  ? Hypertension Other   ? Diabetes Mother   ? Diabetes Father   ? ? ?Social  History:  reports that he has been smoking cigarettes. He has been smoking an average of .5 packs per day. He has never used smokeless tobacco. He reports current alcohol use. He reports that he does not use drugs. ? ? ?ROS  ? ?Physical Exam: ?BP 131/86   Pulse 76   Temp 98 ?F (36.7 ?C)   Ht '5\' 11"'$  (1.803 m)   Wt 228 lb (103.4 kg)   SpO2 96%   BMI 31.80 kg/m?   ?Constitutional:  Well nourished. Alert and oriented, No acute distress. ?HEENT: [place] AT, mask in place.  Trachea midline ?Cardiovascular: No clubbing, cyanosis, or edema. ?Respiratory:  Normal respiratory effort, no increased work of breathing. ?GU: No CVA tenderness.  No bladder fullness or masses.  Patient with uncircumcised phallus. Foreskin easily retracted  Urethral meatus is patent.  No penile discharge. No penile lesions or rashes. Scrotum without lesions, cysts, rashes and/or edema.  Testicles are located scrotally bilaterally. No masses are appreciated in the testicles. Left and right epididymis are normal. ?Neurologic: Grossly intact, no focal deficits, moving all 4 extremities. ?Psychiatric: Normal mood and affect.  ? ?Laboratory Data: ?Results for orders placed or performed during the hospital encounter of 07/01/21  ?Comprehensive metabolic panel  ?Result Value Ref Range  ? Sodium 137 135 - 145 mmol/L  ? Potassium 4.1 3.5 - 5.1 mmol/L  ? Chloride 104 98 - 111 mmol/L  ? CO2 22 22 - 32 mmol/L  ? Glucose, Bld 120 (H) 70 - 99 mg/dL  ? BUN 8 6 - 20 mg/dL  ? Creatinine, Ser 0.63 0.61 - 1.24 mg/dL  ? Calcium 10.0 8.9 - 10.3 mg/dL  ? Total Protein 8.9 (H) 6.5 - 8.1 g/dL  ? Albumin 3.9 3.5 - 5.0 g/dL  ? AST 152 (H) 15 - 41 U/L  ? ALT 195 (H) 0 - 44 U/L  ? Alkaline Phosphatase 70 38 - 126 U/L  ? Total Bilirubin 0.6 0.3 - 1.2 mg/dL  ? GFR, Estimated >60 >60 mL/min  ? Anion gap 11 5 - 15  ?Ethanol  ?Result Value Ref Range  ? Alcohol, Ethyl (B) 324 (HH) <10 mg/dL  ?cbc  ?Result Value Ref Range  ? WBC 7.8 4.0 - 10.5 K/uL  ? RBC 4.70 4.22 - 5.81 MIL/uL  ? Hemoglobin 16.6 13.0 - 17.0 g/dL  ? HCT 47.8 39.0 - 52.0 %  ? MCV 101.7 (H) 80.0 - 100.0 fL  ? MCH 35.3 (H) 26.0 - 34.0 pg  ? MCHC 34.7 30.0 - 36.0 g/dL  ? RDW 14.0 11.5 - 15.5 %  ? Platelets 242 150 - 400 K/uL  ? nRBC 0.0 0.0 - 0.2 %  ?CBG monitoring, ED  ?Result Value Ref Range  ? Glucose-Capillary 126 (H) 70 - 99 mg/dL  ? ?Lab Results  ?Component Value Date  ? WBC 7.8 07/01/2021  ? HGB 16.6 07/01/2021  ? HCT 47.8 07/01/2021  ? MCV 101.7 (H) 07/01/2021  ? PLT 242 07/01/2021  ? ? ?Lab Results  ?Component Value Date  ? CREATININE 0.63 07/01/2021   ? ? ?Lab Results  ?Component Value Date  ? HGBA1C 6.0 (H) 09/03/2020  ? ? ?Lab Results  ?Component Value Date  ? AST 152 (H) 07/01/2021  ? ?Lab Results  ?Component Value Date  ? ALT 195 (H) 07/01/2021  ?I have reviewed the labs. ? ? ?Pertinent Imaging: ?No recent imaging  ? ? ?Assessment & Plan:   ? ?1. Genital herpes ?-continue Valtrex 500 mg daily ?-refill  given ? ?Return in about 1 year (around 12/28/2022) for recheck . ? ?These notes generated with voice recognition software. I apologize for typographical errors. ? ?Zanyah Lentsch, PA-C ? ?[place] ?[place], [place] [address] ?[phone] ?

## 2021-12-27 NOTE — Patient Instructions (Signed)
Follow-up with our office in one year.  ? ?Please call and ask to speak with a nurse if you develop questions or concerns. ? ?

## 2021-12-29 DIAGNOSIS — R7989 Other specified abnormal findings of blood chemistry: Secondary | ICD-10-CM | POA: Diagnosis not present

## 2021-12-29 DIAGNOSIS — L700 Acne vulgaris: Secondary | ICD-10-CM | POA: Diagnosis not present

## 2021-12-29 DIAGNOSIS — I10 Essential (primary) hypertension: Secondary | ICD-10-CM | POA: Diagnosis not present

## 2021-12-29 DIAGNOSIS — E1165 Type 2 diabetes mellitus with hyperglycemia: Secondary | ICD-10-CM | POA: Diagnosis not present

## 2021-12-29 DIAGNOSIS — Z125 Encounter for screening for malignant neoplasm of prostate: Secondary | ICD-10-CM | POA: Diagnosis not present

## 2021-12-31 ENCOUNTER — Encounter: Payer: Self-pay | Admitting: Dermatology

## 2022-01-10 ENCOUNTER — Ambulatory Visit: Payer: 59 | Admitting: Dermatology

## 2022-01-11 ENCOUNTER — Ambulatory Visit: Payer: Self-pay | Admitting: Dermatology

## 2022-01-16 ENCOUNTER — Ambulatory Visit: Payer: Self-pay | Admitting: Dermatology

## 2022-03-13 ENCOUNTER — Telehealth: Payer: Self-pay

## 2022-03-13 NOTE — Telephone Encounter (Signed)
Patient called in asking for status update on Dupixent. Advised patient to call [facility] [employer] to rush the status of patient assistance. aw

## 2022-04-17 ENCOUNTER — Ambulatory Visit: Payer: Self-pay | Admitting: Dermatology

## 2022-05-15 ENCOUNTER — Ambulatory Visit: Payer: 59 | Admitting: Physician Assistant

## 2022-06-18 ENCOUNTER — Other Ambulatory Visit: Payer: Self-pay

## 2022-06-18 ENCOUNTER — Telehealth: Payer: Self-pay

## 2022-06-18 DIAGNOSIS — L209 Atopic dermatitis, unspecified: Secondary | ICD-10-CM

## 2022-06-18 MED ORDER — TRIAMCINOLONE ACETONIDE 0.1 % EX OINT: TOPICAL_OINTMENT | CUTANEOUS | 0 refills | Status: DC

## 2022-06-18 NOTE — Telephone Encounter (Signed)
Patient called requesting refill of Triamcinolone 0.1%. Okay to send in?

## 2022-06-18 NOTE — Progress Notes (Signed)
Triamcinolone refill sent in per Dr. Nehemiah Massed

## 2022-06-18 NOTE — Telephone Encounter (Signed)
Patient is doing Dupixent every 2 weeks for his eczema but he is still having issues with it. Scheduled him to come in and see you on 07/05/22. Triamcinolone refill sent in.

## 2022-07-05 ENCOUNTER — Ambulatory Visit: Payer: 59 | Admitting: Dermatology

## 2022-07-05 ENCOUNTER — Encounter: Payer: Self-pay | Admitting: Dermatology

## 2022-07-05 DIAGNOSIS — Z79899 Other long term (current) drug therapy: Secondary | ICD-10-CM

## 2022-07-05 DIAGNOSIS — L0291 Cutaneous abscess, unspecified: Secondary | ICD-10-CM | POA: Diagnosis not present

## 2022-07-05 DIAGNOSIS — L2081 Atopic neurodermatitis: Secondary | ICD-10-CM

## 2022-07-05 DIAGNOSIS — L0100 Impetigo, unspecified: Secondary | ICD-10-CM

## 2022-07-05 DIAGNOSIS — L03119 Cellulitis of unspecified part of limb: Secondary | ICD-10-CM

## 2022-07-05 DIAGNOSIS — L732 Hidradenitis suppurativa: Secondary | ICD-10-CM | POA: Diagnosis not present

## 2022-07-05 MED ORDER — DOXYCYCLINE MONOHYDRATE 100 MG PO CAPS: 100.0000 mg | ORAL_CAPSULE | Freq: Two times a day (BID) | ORAL | 0 refills | Status: AC

## 2022-07-05 MED ORDER — MUPIROCIN 2 % EX OINT: 1.0000 | TOPICAL_OINTMENT | Freq: Every day | CUTANEOUS | 2 refills | Status: DC

## 2022-07-05 NOTE — Patient Instructions (Signed)
Due to recent changes in healthcare laws, you may see results of your pathology and/or laboratory studies on MyChart before the doctors have had a chance to review them. We understand that in some cases there may be results that are confusing or concerning to you. Please understand that not all results are received at the same time and often the doctors may need to interpret multiple results in order to provide you with the best plan of care or course of treatment. Therefore, we ask that you please give us 2 business days to thoroughly review all your results before contacting the office for clarification. Should we see a critical lab result, you will be contacted sooner.   If You Need Anything After Your Visit  If you have any questions or concerns for your doctor, please call our main line at [phone] and press option 4 to reach your doctor's medical assistant. If no one answers, please leave a voicemail as directed and we will return your call as soon as possible. Messages left after 4 pm will be answered the following business day.   You may also send us a message via MyChart. We typically respond to MyChart messages within 1-2 business days.  For prescription refills, please ask your pharmacy to contact our office. Our fax number is [phone].  If you have an urgent issue when the clinic is closed that cannot wait until the next business day, you can page your doctor at the number below.    Please note that while we do our best to be available for urgent issues outside of office hours, we are not available 24/7.   If you have an urgent issue and are unable to reach us, you may choose to seek medical care at your doctor's office, retail clinic, urgent care center, or emergency room.  If you have a medical emergency, please immediately call 911 or go to the emergency department.  Pager Numbers  - Dr. Kowalski: [phone]  - Dr. Moye: [phone]  - Dr. Stewart:  [phone]  In the event of inclement weather, please call our main line at [phone] for an update on the status of any delays or closures.  Dermatology Medication Tips: Please keep the boxes that topical medications come in in order to help keep track of the instructions about where and how to use these. Pharmacies typically print the medication instructions only on the boxes and not directly on the medication tubes.   If your medication is too expensive, please contact our office at [phone] option 4 or send us a message through MyChart.   We are unable to tell what your co-pay for medications will be in advance as this is different depending on your insurance coverage. However, we may be able to find a substitute medication at lower cost or fill out paperwork to get insurance to cover a needed medication.   If a prior authorization is required to get your medication covered by your insurance company, please allow us 1-2 business days to complete this process.  Drug prices often vary depending on where the prescription is filled and some pharmacies may offer cheaper prices.  The website www.goodrx.com contains coupons for medications through different pharmacies. The prices here do not account for what the cost may be with help from insurance (it may be cheaper with your insurance), but the website can give you the price if you did not use any insurance.  - You can print the associated coupon and take it with   your prescription to the pharmacy.  - You may also stop by our office during regular business hours and pick up a GoodRx coupon card.  - If you need your prescription sent electronically to a different pharmacy, notify our office through [facility] MyChart or by phone at [phone] option 4.     Si Usted Necesita Algo Despus de Su Visita  Tambin puede enviarnos un mensaje a travs de MyChart. Por lo general respondemos a los mensajes de MyChart en el transcurso de 1 a 2  das hbiles.  Para renovar recetas, por favor pida a su farmacia que se ponga en contacto con nuestra oficina. Nuestro nmero de fax es el [phone].  Si tiene un asunto urgente cuando la clnica est cerrada y que no puede esperar hasta el siguiente da hbil, puede llamar/localizar a su doctor(a) al nmero que aparece a continuacin.   Por favor, tenga en cuenta que aunque hacemos todo lo posible para estar disponibles para asuntos urgentes fuera del horario de oficina, no estamos disponibles las 24 horas del da, los 7 das de la semana.   Si tiene un problema urgente y no puede comunicarse con nosotros, puede optar por buscar atencin mdica  en el consultorio de su doctor(a), en una clnica privada, en un centro de atencin urgente o en una sala de emergencias.  Si tiene una emergencia mdica, por favor llame inmediatamente al 911 o vaya a la sala de emergencias.  Nmeros de bper  - Dr. Kowalski: [phone]  - Dra. Moye: [phone]  - Dra. Stewart: [phone]  En caso de inclemencias del tiempo, por favor llame a nuestra lnea principal al [phone] para una actualizacin sobre el estado de cualquier retraso o cierre.  Consejos para la medicacin en dermatologa: Por favor, guarde las cajas en las que vienen los medicamentos de uso tpico para ayudarle a seguir las instrucciones sobre dnde y cmo usarlos. Las farmacias generalmente imprimen las instrucciones del medicamento slo en las cajas y no directamente en los tubos del medicamento.   Si su medicamento es muy caro, por favor, pngase en contacto con nuestra oficina llamando al [phone] y presione la opcin 4 o envenos un mensaje a travs de MyChart.   No podemos decirle cul ser su copago por los medicamentos por adelantado ya que esto es diferente dependiendo de la cobertura de su seguro. Sin embargo, es posible que podamos encontrar un medicamento sustituto a menor costo o llenar un formulario para que el  seguro cubra el medicamento que se considera necesario.   Si se requiere una autorizacin previa para que su compaa de seguros cubra su medicamento, por favor permtanos de 1 a 2 das hbiles para completar este proceso.  Los precios de los medicamentos varan con frecuencia dependiendo del lugar de dnde se surte la receta y alguna farmacias pueden ofrecer precios ms baratos.  El sitio web www.goodrx.com tiene cupones para medicamentos de diferentes farmacias. Los precios aqu no tienen en cuenta lo que podra costar con la ayuda del seguro (puede ser ms barato con su seguro), pero el sitio web puede darle el precio si no utiliz ningn seguro.  - Puede imprimir el cupn correspondiente y llevarlo con su receta a la farmacia.  - Tambin puede pasar por nuestra oficina durante el horario de atencin regular y recoger una tarjeta de cupones de GoodRx.  - Si necesita que su receta se enve electrnicamente a una farmacia diferente, informe a nuestra oficina a travs de MyChart de [facility]   o por telfono llamando al [phone] y presione la opcin 4.  

## 2022-07-05 NOTE — Progress Notes (Signed)
Follow-Up Visit   Subjective  Curtis Peterson is a 46 y.o. male who presents for the following: Dermatitis (Arms, legs, shoulders, back, feet, itchy, Dupixent sq injections q 2 wks, TMC 0.1% oint tid) and Hidradenitis (Abdomen, axilla, scalp, pt stopped Isotretinoin 10/2021 due to Eczema flare, Total Isotretinoin taken 7,'500mg'$ , '75mg'$ /kg).  The following portions of the chart were reviewed this encounter and updated as appropriate:   Tobacco  Allergies  Meds  Problems  Med Hx  Surg Hx  Fam Hx     Review of Systems:  No other skin or systemic complaints except as noted in HPI or Assessment and Plan.  Objective  Well appearing patient in no apparent distress; mood and affect are within normal limits.  A focused examination was performed including arms, shoulders, abdomen, legs. Relevant physical exam findings are noted in the Assessment and Plan.  arms, legs, back, feet Legs with open sores, patchy erythema with crusting                       abdomen, axilla, scalp Draining abscess abdomen     arms, legs Patchy erythema with crusting, legs, arms   Assessment & Plan  Atopic neurodermatitis arms, legs, back, feet  With severe pruritus Exacerbated by Impetigo Chronic and persistent condition with duration or expected duration over one year. Condition is bothersome/symptomatic for patient. Currently flared.  Atopic dermatitis - Severe, on Dupixent (biologic medication).  Atopic dermatitis (eczema) is a chronic, relapsing, pruritic condition that can significantly affect quality of life. It is often associated with allergic rhinitis and/or asthma and can require treatment with topical medications, phototherapy, or in severe cases a biologic medication called Dupixent, which requires long term medication management.    Start Mupirocin oint qd to open sores Cont Dupixent '300mg'$ /5m sq injections D/C TMC 0.1% oint for now  May consider Rinvoq or Cibinqo in  future  doxycycline (MONODOX) 100 MG capsule - arms, legs, back, feet Take 1 capsule (100 mg total) by mouth 2 (two) times daily for 10 days. Take with food and drink  mupirocin ointment (BACTROBAN) 2 % - arms, legs, back, feet Apply 1 Application topically daily. Qd to all open sores on body  Hidradenitis suppurativa with active abscess of the abdomen abdomen, axilla, scalp Chronic and persistent condition with duration or expected duration over one year. Condition is bothersome/symptomatic for patient. Currently flared.  Hidradenitis Suppurativa is a chronic; persistent; non-curable, but treatable condition due to abnormal inflamed sweat glands in the body folds (axilla, inframammary, groin, medial thighs), causing recurrent painful draining cysts and scarring. It can be associated with severe scarring acne and cysts; also abscesses and scarring of scalp. The goal is control and prevention of flares, as it is not curable. Scars are permanent and can be thickened. Treatment may include daily use of topical medication and oral antibiotics.  Oral isotretinoin may also be helpful.  For more severe cases, Humira (a biologic injection) may be prescribed to decrease the inflammatory process and prevent flares.  When indicated, inflamed cysts may also be treated surgically.  Start Mupirocin oint qd to open sores Doxycycline 100 twice daily for 10 days Aerobic Bacterial Culture today  Will address longer term treatment of Hidradenitis once Atopic Derm under better control  Related Procedures Aerobic culture  Impetigo with cellulitis arms, legs Start Doxycycline '100mg'$  1 po bid with food and drink for 10 days Start Mupirocin oint qd to open sores  Doxycycline should be taken with  food to prevent nausea. Do not lay down for 30 minutes after taking. Be cautious with sun exposure and use good sun protection while on this medication. Pregnant women should not take this medication.    Return in about  2 weeks (around 07/19/2022) for AD and HS f/u.  I, Othelia Pulling, RMA, am acting as scribe for Sarina Ser, MD . Documentation: I have reviewed the above documentation for accuracy and completeness, and I agree with the above.  Sarina Ser, MD

## 2022-07-07 ENCOUNTER — Encounter: Payer: Self-pay | Admitting: Intensive Care

## 2022-07-07 ENCOUNTER — Other Ambulatory Visit: Payer: Self-pay

## 2022-07-07 ENCOUNTER — Emergency Department: Payer: 59

## 2022-07-07 ENCOUNTER — Emergency Department
Admission: EM | Admit: 2022-07-07 | Discharge: 2022-07-07 | Disposition: A | Discharge status: Home / Self Care | Payer: 59 | Attending: Emergency Medicine | Admitting: Emergency Medicine

## 2022-07-07 DIAGNOSIS — M25561 Pain in right knee: Secondary | ICD-10-CM | POA: Diagnosis not present

## 2022-07-07 DIAGNOSIS — S8991XA Unspecified injury of right lower leg, initial encounter: Secondary | ICD-10-CM | POA: Diagnosis not present

## 2022-07-07 MED ORDER — HYDROCODONE-ACETAMINOPHEN 5-325 MG PO TABS: 1.0000 | ORAL_TABLET | Freq: Four times a day (QID) | ORAL | 0 refills | Status: DC | PRN

## 2022-07-07 MED ORDER — IBUPROFEN 600 MG PO TABS: 600.0000 mg | ORAL_TABLET | Freq: Four times a day (QID) | ORAL | 0 refills | Status: DC | PRN

## 2022-07-07 MED ORDER — HYDROCODONE-ACETAMINOPHEN 5-325 MG PO TABS
1.0000 | ORAL_TABLET | Freq: Once | ORAL | Status: AC
  Administered 2022-07-07: 1 via ORAL
  Filled 2022-07-07: qty 1

## 2022-07-07 NOTE — ED Notes (Signed)
See triage note. R knee pain since yesterday. Ambulatory.

## 2022-07-07 NOTE — ED Provider Notes (Signed)
[facility] Provider Note    Event Date/Time   First MD Initiated Contact with Patient 07/07/22 1030     (approximate)   History   Knee Pain (right)   HPI  Curtis Peterson is a 46 y.o. male   to the ED with complaint of right knee pain.  Patient states that he fell last evening directly on his knee and has continued to have pain since that time.  He denies any head injury or loss of consciousness.  He has continued to ambulate but this morning is more painful.  Patient has a history of hypertension and diabetes.      Physical Exam   Triage Vital Signs: ED Triage Vitals  Enc Vitals Group     BP 07/07/22 0849 102/66     Pulse Rate 07/07/22 0849 (!) 105     Resp 07/07/22 0849 16     Temp 07/07/22 0849 98.2 F (36.8 C)     Temp Source 07/07/22 0849 Oral     SpO2 07/07/22 0849 95 %     Weight 07/07/22 0847 235 lb (106.6 kg)     Height 07/07/22 0847 '5\' 11"'$  (1.803 m)     Head Circumference --      Peak Flow --      Pain Score 07/07/22 0846 8     Pain Loc --      Pain Edu? --      Excl. in [place]? --     Most recent vital signs: Vitals:   07/07/22 0849  BP: 102/66  Pulse: (!) 105  Resp: 16  Temp: 98.2 F (36.8 C)  SpO2: 95%     General: Awake, no distress.  CV:  Good peripheral perfusion.  Resp:  Normal effort.  Abd:  No distention.  Other:  Right knee is without effusion, discoloration or deformity.  No soft tissue edema is appreciated.  There is tenderness on palpation of the medial aspect infra patella area.  Range of motion is slow secondary to discomfort.  No crepitus is noted.  Skin is intact.   ED Results / Procedures / Treatments   Labs (all labs ordered are listed, but only abnormal results are displayed) Labs Reviewed - No data to display    RADIOLOGY  X-ray images of her right knee were reviewed and interpreted by myself and pending of the radiologist and was negative for fracture or dislocation.  Official radiology report  is negative.   PROCEDURES:  Critical Care performed:   Procedures   MEDICATIONS ORDERED IN ED: Medications  HYDROcodone-acetaminophen (NORCO/VICODIN) 5-325 MG per tablet 1 tablet (1 tablet Oral Given 07/07/22 1116)     IMPRESSION / MDM / ASSESSMENT AND PLAN / ED COURSE  I reviewed the triage vital signs and the nursing notes.   Differential diagnosis includes, but is not limited to, contusion right knee, sprain, fracture, dislocation, internal derangement.  46 year old male who presents to the ED with complaint of right knee pain after falling on his knee last evening.  Physical exam showed some soft tissue edema that was tender more medially but no crepitus or effusion was noted.  X-ray was negative and reassuring and patient was made aware.  We will place in a knee immobilizer for added support and protection.  Patient is to follow-up with Dr. Leim Fabry who is on-call for orthopedics if he continues to have any continued problems.  Ice and elevation was encouraged.  A prescription for ibuprofen and hydrocodone was  sent to the pharmacy to take for inflammation and pain.      Patient's presentation is most consistent with acute complicated illness / injury requiring diagnostic workup.  FINAL CLINICAL IMPRESSION(S) / ED DIAGNOSES   Final diagnoses:  Acute pain of right knee     Rx / DC Orders   ED Discharge Orders          Ordered    HYDROcodone-acetaminophen (NORCO/VICODIN) 5-325 MG tablet  Every 6 hours PRN        07/07/22 1103    ibuprofen (ADVIL) 600 MG tablet  Every 6 hours PRN        07/07/22 1103             Note:  This document was prepared using Dragon voice recognition software and may include unintentional dictation errors.   Johnn Hai, PA-C 07/07/22 1140    Delman Kitten, MD 07/07/22 1527

## 2022-07-07 NOTE — ED Triage Notes (Signed)
Patient c/o right knee pain that started yesterday.

## 2022-07-07 NOTE — Discharge Instructions (Addendum)
Make an appoint with Dr. Leim Fabry who is on-call for orthopedics if you continue to have problems with your knee.  A prescription for ibuprofen was sent to the pharmacy to take with food as needed for inflammation and pain.  A second prescription was sent for hydrocodone if needed for more severe pain.  Ice and elevation and wear the knee immobilizer when you are up walking for added support.

## 2022-07-10 ENCOUNTER — Telehealth: Payer: Self-pay

## 2022-07-10 DIAGNOSIS — Z6833 Body mass index (BMI) 33.0-33.9, adult: Secondary | ICD-10-CM | POA: Diagnosis not present

## 2022-07-10 DIAGNOSIS — E669 Obesity, unspecified: Secondary | ICD-10-CM | POA: Diagnosis not present

## 2022-07-10 DIAGNOSIS — E119 Type 2 diabetes mellitus without complications: Secondary | ICD-10-CM | POA: Diagnosis not present

## 2022-07-10 DIAGNOSIS — I10 Essential (primary) hypertension: Secondary | ICD-10-CM | POA: Diagnosis not present

## 2022-07-10 DIAGNOSIS — Z720 Tobacco use: Secondary | ICD-10-CM | POA: Diagnosis not present

## 2022-07-10 DIAGNOSIS — Z7984 Long term (current) use of oral hypoglycemic drugs: Secondary | ICD-10-CM | POA: Diagnosis not present

## 2022-07-10 LAB — AEROBIC CULTURE

## 2022-07-10 NOTE — Telephone Encounter (Signed)
Patient informed of culture results.  

## 2022-07-10 NOTE — Telephone Encounter (Signed)
-----   Message from Ralene Bathe, MD sent at 07/10/2022 10:14 AM EDT ----- Culture from skin 07/05/2022 showed: Moderate growth Staphylococcus aureus Sensitive to TCN Continue Doxycycline and Mupirocin until medication all gone.

## 2022-07-19 ENCOUNTER — Ambulatory Visit: Payer: 59 | Admitting: Dermatology

## 2022-09-19 ENCOUNTER — Ambulatory Visit: Payer: 59 | Admitting: Dermatology

## 2022-09-19 DIAGNOSIS — Z79899 Other long term (current) drug therapy: Secondary | ICD-10-CM | POA: Diagnosis not present

## 2022-09-19 DIAGNOSIS — L2089 Other atopic dermatitis: Secondary | ICD-10-CM

## 2022-09-19 MED ORDER — SHINGRIX 50 MCG/0.5ML IM SUSR: 0.5000 mL | INTRAMUSCULAR | 1 refills | Status: DC

## 2022-09-19 NOTE — Progress Notes (Signed)
   Follow-Up Visit   Subjective  Curtis Peterson is a 46 y.o. male who presents for the following: Follow-up (Atopic dermatitis follow up - Dupixent injections are not helping - discussed starting Rinvoq. No history of shingles).  The following portions of the chart were reviewed this encounter and updated as appropriate:   Tobacco  Allergies  Meds  Problems  Med Hx  Surg Hx  Fam Hx     Review of Systems:  No other skin or systemic complaints except as noted in HPI or Assessment and Plan.  Objective  Well appearing patient in no apparent distress; mood and affect are within normal limits.  A focused examination was performed including face. Relevant physical exam findings are noted in the Assessment and Plan.   Assessment & Plan  Other atopic dermatitis -severe and persistent despite Dupixent treatment.  Patient feels Dupixent not adequate treatment and would like to pursue something more aggressive.  Atopic dermatitis - Severe, on Dupixent (biologic medication).  Atopic dermatitis (eczema) is a chronic, relapsing, pruritic condition that can significantly affect quality of life. It is often associated with allergic rhinitis and/or asthma and can require treatment with topical medications, phototherapy, or in severe cases a biologic medication called Dupixent, which requires long term medication management.   Chronic and persistent condition with duration or expected duration over one year. Condition is symptomatic / bothersome to patient. Not to goal.  No history of shingles - recommend patient get Shingrix vaccine before starting Rinvoq. Will plan to start Rinvoq or Cibinqo pending labs.  Zoster Vaccine Adjuvanted [facility]) injection Inject 0.5 mLs into the muscle as directed. - Ordered  Comprehensive metabolic panel CBC with Differential/Platelet Hepatitis B surface antibody,qualitative Hepatitis B surface antigen Hepatitis C antibody QuantiFERON-TB Gold Plus Hepatitis B  core antibody, total Lipid Panel  Related Medications Ruxolitinib Phosphate (OPZELURA) 1.5 % CREA Apply 1 application topically 2 (two) times daily. Apply to rash BID dupilumab (DUPIXENT) 300 MG/2ML prefilled syringe Inject 300 mg into the skin every 14 (fourteen) days. Starting at day 15 for maintenance.  Return in about 1 month (around 10/20/2022).  I, Ashok Cordia, CMA, am acting as scribe for Sarina Ser, MD . Documentation: I have reviewed the above documentation for accuracy and completeness, and I agree with the above.  Sarina Ser, MD

## 2022-09-19 NOTE — Patient Instructions (Signed)
Due to recent changes in healthcare laws, you may see results of your pathology and/or laboratory studies on MyChart before the doctors have had a chance to review them. We understand that in some cases there may be results that are confusing or concerning to you. Please understand that not all results are received at the same time and often the doctors may need to interpret multiple results in order to provide you with the best plan of care or course of treatment. Therefore, we ask that you please give us 2 business days to thoroughly review all your results before contacting the office for clarification. Should we see a critical lab result, you will be contacted sooner.   If You Need Anything After Your Visit  If you have any questions or concerns for your doctor, please call our main line at [phone] and press option 4 to reach your doctor's medical assistant. If no one answers, please leave a voicemail as directed and we will return your call as soon as possible. Messages left after 4 pm will be answered the following business day.   You may also send us a message via MyChart. We typically respond to MyChart messages within 1-2 business days.  For prescription refills, please ask your pharmacy to contact our office. Our fax number is [phone].  If you have an urgent issue when the clinic is closed that cannot wait until the next business day, you can page your doctor at the number below.    Please note that while we do our best to be available for urgent issues outside of office hours, we are not available 24/7.   If you have an urgent issue and are unable to reach us, you may choose to seek medical care at your doctor's office, retail clinic, urgent care center, or emergency room.  If you have a medical emergency, please immediately call 911 or go to the emergency department.  Pager Numbers  - Dr. Kowalski: [phone]  - Dr. Moye: [phone]  - Dr. Stewart:  [phone]  In the event of inclement weather, please call our main line at [phone] for an update on the status of any delays or closures.  Dermatology Medication Tips: Please keep the boxes that topical medications come in in order to help keep track of the instructions about where and how to use these. Pharmacies typically print the medication instructions only on the boxes and not directly on the medication tubes.   If your medication is too expensive, please contact our office at [phone] option 4 or send us a message through MyChart.   We are unable to tell what your co-pay for medications will be in advance as this is different depending on your insurance coverage. However, we may be able to find a substitute medication at lower cost or fill out paperwork to get insurance to cover a needed medication.   If a prior authorization is required to get your medication covered by your insurance company, please allow us 1-2 business days to complete this process.  Drug prices often vary depending on where the prescription is filled and some pharmacies may offer cheaper prices.  The website www.goodrx.com contains coupons for medications through different pharmacies. The prices here do not account for what the cost may be with help from insurance (it may be cheaper with your insurance), but the website can give you the price if you did not use any insurance.  - You can print the associated coupon and take it with   your prescription to the pharmacy.  - You may also stop by our office during regular business hours and pick up a GoodRx coupon card.  - If you need your prescription sent electronically to a different pharmacy, notify our office through [place] MyChart or by phone at [phone] option 4.     Si Usted Necesita Algo Despus de Su Visita  Tambin puede enviarnos un mensaje a travs de MyChart. Por lo general respondemos a los mensajes de MyChart en el transcurso de 1 a 2  das hbiles.  Para renovar recetas, por favor pida a su farmacia que se ponga en contacto con nuestra oficina. Nuestro nmero de fax es el [phone].  Si tiene un asunto urgente cuando la clnica est cerrada y que no puede esperar hasta el siguiente da hbil, puede llamar/localizar a su doctor(a) al nmero que aparece a continuacin.   Por favor, tenga en cuenta que aunque hacemos todo lo posible para estar disponibles para asuntos urgentes fuera del horario de oficina, no estamos disponibles las 24 horas del da, los 7 das de la semana.   Si tiene un problema urgente y no puede comunicarse con nosotros, puede optar por buscar atencin mdica  en el consultorio de su doctor(a), en una clnica privada, en un centro de atencin urgente o en una sala de emergencias.  Si tiene una emergencia mdica, por favor llame inmediatamente al 911 o vaya a la sala de emergencias.  Nmeros de bper  - Dr. Kowalski: [phone]  - Dra. Moye: [phone]  - Dra. Stewart: [phone]  En caso de inclemencias del tiempo, por favor llame a nuestra lnea principal al [phone] para una actualizacin sobre el estado de cualquier retraso o cierre.  Consejos para la medicacin en dermatologa: Por favor, guarde las cajas en las que vienen los medicamentos de uso tpico para ayudarle a seguir las instrucciones sobre dnde y cmo usarlos. Las farmacias generalmente imprimen las instrucciones del medicamento slo en las cajas y no directamente en los tubos del medicamento.   Si su medicamento es muy caro, por favor, pngase en contacto con nuestra oficina llamando al [phone] y presione la opcin 4 o envenos un mensaje a travs de MyChart.   No podemos decirle cul ser su copago por los medicamentos por adelantado ya que esto es diferente dependiendo de la cobertura de su seguro. Sin embargo, es posible que podamos encontrar un medicamento sustituto a menor costo o llenar un formulario para que el  seguro cubra el medicamento que se considera necesario.   Si se requiere una autorizacin previa para que su compaa de seguros cubra su medicamento, por favor permtanos de 1 a 2 das hbiles para completar este proceso.  Los precios de los medicamentos varan con frecuencia dependiendo del lugar de dnde se surte la receta y alguna farmacias pueden ofrecer precios ms baratos.  El sitio web www.goodrx.com tiene cupones para medicamentos de diferentes farmacias. Los precios aqu no tienen en cuenta lo que podra costar con la ayuda del seguro (puede ser ms barato con su seguro), pero el sitio web puede darle el precio si no utiliz ningn seguro.  - Puede imprimir el cupn correspondiente y llevarlo con su receta a la farmacia.  - Tambin puede pasar por nuestra oficina durante el horario de atencin regular y recoger una tarjeta de cupones de GoodRx.  - Si necesita que su receta se enve electrnicamente a una farmacia diferente, informe a nuestra oficina a travs de MyChart de [place]   o por telfono llamando al [phone] y presione la opcin 4.  

## 2022-10-04 ENCOUNTER — Encounter: Payer: Self-pay | Admitting: Dermatology

## 2022-10-22 ENCOUNTER — Ambulatory Visit: Payer: 59 | Admitting: Dermatology

## 2022-10-29 ENCOUNTER — Other Ambulatory Visit: Payer: Self-pay | Admitting: Dermatology

## 2022-10-29 DIAGNOSIS — L209 Atopic dermatitis, unspecified: Secondary | ICD-10-CM

## 2022-11-05 DIAGNOSIS — R7989 Other specified abnormal findings of blood chemistry: Secondary | ICD-10-CM | POA: Diagnosis not present

## 2022-11-05 DIAGNOSIS — L02219 Cutaneous abscess of trunk, unspecified: Secondary | ICD-10-CM | POA: Diagnosis not present

## 2022-11-05 DIAGNOSIS — L2089 Other atopic dermatitis: Secondary | ICD-10-CM | POA: Diagnosis not present

## 2022-11-05 DIAGNOSIS — F17210 Nicotine dependence, cigarettes, uncomplicated: Secondary | ICD-10-CM | POA: Diagnosis not present

## 2022-11-05 DIAGNOSIS — L03311 Cellulitis of abdominal wall: Secondary | ICD-10-CM | POA: Diagnosis not present

## 2022-11-05 DIAGNOSIS — M25561 Pain in right knee: Secondary | ICD-10-CM | POA: Diagnosis not present

## 2022-11-05 DIAGNOSIS — R253 Fasciculation: Secondary | ICD-10-CM | POA: Diagnosis not present

## 2022-11-05 DIAGNOSIS — Z6832 Body mass index (BMI) 32.0-32.9, adult: Secondary | ICD-10-CM | POA: Diagnosis not present

## 2022-11-05 DIAGNOSIS — Z125 Encounter for screening for malignant neoplasm of prostate: Secondary | ICD-10-CM | POA: Diagnosis not present

## 2022-11-05 DIAGNOSIS — E1165 Type 2 diabetes mellitus with hyperglycemia: Secondary | ICD-10-CM | POA: Diagnosis not present

## 2022-11-05 DIAGNOSIS — L732 Hidradenitis suppurativa: Secondary | ICD-10-CM | POA: Diagnosis not present

## 2022-12-31 NOTE — Progress Notes (Deleted)
01/01/2023 10:30 AM   Curtis Peterson 1976/05/03 [id number]  Referring provider: Tracie Harrier, MD [address] [facility] [place],  [place] [address]  No chief complaint on file.  Urological history 1. 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 male who presents today for a yearly recheck with his SO, Curtis Peterson.    He has had no herpes breakouts over the last year His SO, Curtis Peterson, is wanting to start suppressive therapy He has not issues with urination Patient denies any modifying or aggravating factors.  Patient denies any gross hematuria, dysuria or suprapubic/flank pain.  Patient denies any fevers, chills, nausea or vomiting.   He has no issues with erections Patient still having spontaneous erections.  He denies any pain or curvature with erections.     Score:  1-7 Mild 8-19 Moderate 20-35 Severe     Score: 1-7 Severe ED 8-11 Moderate ED 12-16 Mild-Moderate ED 17-21 Mild ED 22-25 No ED   PMH: Past Medical History:  Diagnosis Date   Diabetes mellitus without complication ([place])    Hypertension     Surgical History: Past Surgical History:  Procedure Laterality Date   head lesions removed  2015   HYDRADENITIS EXCISION N/A 02/26/2020   Procedure: EXCISION HIDRADENITIS, Perineum;  Surgeon: Jules Husbands, MD;  Location: [facility];  Service: General;  Laterality: N/A;   INCISION AND DRAINAGE ABSCESS N/A 09/04/2020   Procedure: INCISION AND DRAINAGE SKIN ABSCESS;  Surgeon: Olean Ree, MD;  Location: [facility];  Service: General;  Laterality: N/A;  Suprapubic area   [place] N/A 06/30/2019   Procedure: IRRIGATION AND DEBRIDEMENT PERIRECTAL ABSCESS;  Surgeon: Jules Husbands, MD;  Location: [facility];  Service: General;  Laterality: N/A;   WOUND EXPLORATION N/A 09/06/2020   Procedure: WOUND EXPLORATION;  Surgeon: Jules Husbands, MD;  Location: [facility];  Service:  General;  Laterality: N/A;    Home Medications:  Allergies as of 01/01/2023   No Known Allergies      Medication List        Accurate as of December 31, 2022 10:30 AM. If you have any questions, ask your nurse or doctor.          acetaminophen 325 MG tablet Commonly known as: TYLENOL Take 650 mg by mouth every 6 (six) hours as needed for moderate pain.   clobetasol cream 0.05 % Commonly known as: TEMOVATE Apply 1 application topically as directed. Once to twice daily as needed for flare. Avoid face, groin and underarms.   Dupixent 300 MG/2ML prefilled syringe Generic drug: dupilumab Inject 300 mg into the skin every 14 (fourteen) days. Starting at day 15 for maintenance.   HYDROcodone-acetaminophen 5-325 MG tablet Commonly known as: NORCO/VICODIN Take 1 tablet by mouth every 6 (six) hours as needed.   hydrOXYzine 10 MG tablet Commonly known as: ATARAX Take 1 tablet (10 mg total) by mouth 3 (three) times daily as needed for itching.   ibuprofen 600 MG tablet Commonly known as: ADVIL Take 1 tablet (600 mg total) by mouth every 6 (six) hours as needed.   lisinopril 2.5 MG tablet Commonly known as: ZESTRIL Take 2.5 mg by mouth daily.   mupirocin ointment 2 % Commonly known as: BACTROBAN Apply 1 Application topically daily. Qd to all open sores on body   Opzelura 1.5 % Crea Generic drug: Ruxolitinib Phosphate Apply 1 application topically 2 (two) times daily. Apply to rash  BID   Shingrix injection Generic drug: Zoster Vaccine Adjuvanted Inject 0.5 mLs into the muscle as directed.   triamcinolone ointment 0.1 % Commonly known as: KENALOG APPLY TO AFFECTED AREA TWICE A DAY FOR 2 WEEKS, THEN DEC TO 5 DAYS A WEEK THEREAFTER, AVOID THE FACE, GROIN AND UNDERARMS   valACYclovir 500 MG tablet Commonly known as: VALTREX Take 1 tablet (500 mg total) by mouth daily.        Allergies: No Known Allergies  Family History: Family History  Problem Relation Age of  Onset   Hypertension Other    Diabetes Mother    Diabetes Father     Social History:  reports that he has been smoking cigarettes. He has been smoking an average of .5 packs per day. He has never used smokeless tobacco. He reports current alcohol use of about 6.0 standard drinks of alcohol per week. He reports current drug use. Drug: Marijuana.   ROS   Physical Exam: There were no vitals taken for this visit.  Constitutional:  Well nourished. Alert and oriented, No acute distress. HEENT: [place] AT, moist mucus membranes.  Trachea midline Cardiovascular: No clubbing, cyanosis, or edema. Respiratory: Normal respiratory effort, no increased work of breathing. GU: No CVA tenderness.  No bladder fullness or masses.  Patient with circumcised/uncircumcised phallus. ***Foreskin easily retracted***  Urethral meatus is patent.  No penile discharge. No penile lesions or rashes. Scrotum without lesions, cysts, rashes and/or edema.  Testicles are located scrotally bilaterally. No masses are appreciated in the testicles. Left and right epididymis are normal. Rectal: Patient with  normal sphincter tone. Anus and perineum without scarring or rashes. No rectal masses are appreciated. Prostate is approximately *** grams, *** nodules are appreciated. Seminal vesicles are normal. Neurologic: Grossly intact, no focal deficits, moving all 4 extremities. Psychiatric: Normal mood and affect.   Laboratory Data: ontains abnormal data Urinalysis w/Microscopic Order: [id number] Component Ref Range & Units 1 mo ago  Color Colorless, Straw, Light Yellow, Yellow, Dark Yellow Yellow  Clarity Clear Clear  Specific Gravity 1.005 - 1.030 1.024  pH, Urine 5.0 - 8.0 5.5  Protein, Urinalysis Negative mg/dL 2+ Abnormal   Glucose, Urinalysis Negative mg/dL Negative  Ketones, Urinalysis Negative mg/dL 1+ Abnormal   Blood, Urinalysis Negative Negative  Nitrite, Urinalysis Negative Negative  Leukocyte Esterase,  Urinalysis Negative Negative  Bilirubin, Urinalysis Negative Negative  Urobilinogen, Urinalysis 0.2 - 1.0 mg/dL 0.2  WBC, UA <=5 /hpf 1  Red Blood Cells, Urinalysis <=3 /hpf 1  Bacteria, Urinalysis 0 - 5 /hpf 0-5  Squamous Epithelial Cells, Urinalysis /hpf 0  Resulting Agency [facility] - [facility]   Specimen Collected: 11/05/22 10:08   Performed by: R[place]: 11/05/22 14:55  Received From: [facility]  Result Received: 12/31/22 10:34   Serum creatinine (10/2022) 0.6 Hemoglobin A1c (10/2022) 6.7 Total cholesterol (10/2022) 142 PSA (10/2022) 1.20 TSH (10/2022) 1.745 I have reviewed the labs.   Pertinent Imaging: No recent imaging    Assessment & Plan:    1. Genital herpes -continue Valtrex 500 mg daily -refill given  2. Prostate cancer screening -Discussed with the patient that there have been studies that have demonstrated a prognostic value of obtaining a baseline PSA in early mid life.  It appears that baseline PSA was a stronger predictor of prostate cancer risk then race and family history of prostate cancer -Explained that his PSA of 1.2 is above the acceptable baseline PSA for his age group -  Encouraged him to continue with PSA screening every 1 to 2 years  No follow-ups on file.  These notes generated with voice recognition software. I apologize for typographical errors.  [place], [place] [address]  [place], [place] [address] [phone]

## 2023-01-01 ENCOUNTER — Ambulatory Visit: Payer: 59 | Admitting: Urology

## 2023-01-01 DIAGNOSIS — Z125 Encounter for screening for malignant neoplasm of prostate: Secondary | ICD-10-CM

## 2023-01-01 DIAGNOSIS — A6002 Herpesviral infection of other male genital organs: Secondary | ICD-10-CM

## 2023-01-02 ENCOUNTER — Encounter: Payer: Self-pay | Admitting: Urology

## 2023-03-09 ENCOUNTER — Other Ambulatory Visit: Payer: Self-pay | Admitting: Dermatology

## 2023-03-09 DIAGNOSIS — L209 Atopic dermatitis, unspecified: Secondary | ICD-10-CM

## 2023-03-14 ENCOUNTER — Encounter: Payer: Self-pay | Admitting: Physician Assistant

## 2023-03-14 ENCOUNTER — Ambulatory Visit (INDEPENDENT_AMBULATORY_CARE_PROVIDER_SITE_OTHER): Payer: 59 | Admitting: Physician Assistant

## 2023-03-14 VITALS — BP 129/88 | HR 80 | Temp 98.0°F | Ht 71.0 in | Wt 220.0 lb

## 2023-03-14 DIAGNOSIS — L03311 Cellulitis of abdominal wall: Secondary | ICD-10-CM | POA: Diagnosis not present

## 2023-03-14 MED ORDER — SULFAMETHOXAZOLE-TRIMETHOPRIM 800-160 MG PO TABS: 1.0000 | ORAL_TABLET | Freq: Two times a day (BID) | ORAL | 0 refills | Status: DC

## 2023-03-14 NOTE — Progress Notes (Signed)
[facility] NOTE  03/14/2023  History of Present Illness: Curtis Peterson is a 47 y.o. male well known to our service following multiple admissions and I&Ds for hidradenitis most recently in December of 2021. He has been following up with dermatology in the interim. He had been on Dupixent long term with Dr Gwen Pounds. He was attempting to switch to Rinvoq but he did not want to get shingles vaccine prior to starting this. Last seen by dermatology in December 2023. He is working to get back on Dupixent currently. He has been noticing a significant out break of eczema in the interim, worse on flexor surfaces and lower legs. However, he presents today with concerns over return of purulent drainage from lower anterior abdominal wall. This is worse in the left groin and suprapubic region. He denied any fever, chills, nausea, emesis. He does have abdominal pain/pressure. In the past, he has noted improvement with Bactrim. His previous Cx have grown MSSA   Past Medical History: Past Medical History:  Diagnosis Date   Diabetes mellitus without complication (HCC)    Hypertension      Past Surgical History: Past Surgical History:  Procedure Laterality Date   head lesions removed  2015   HYDRADENITIS EXCISION N/A 02/26/2020   Procedure: EXCISION HIDRADENITIS, Perineum;  Surgeon: Leafy Ro, MD;  Location: [facility];  Service: General;  Laterality: N/A;   INCISION AND DRAINAGE ABSCESS N/A 09/04/2020   Procedure: INCISION AND DRAINAGE SKIN ABSCESS;  Surgeon: Henrene Dodge, MD;  Location: [facility];  Service: General;  Laterality: N/A;  Suprapubic area   INCISION AND DRAINAGE PERIRECTAL ABSCESS N/A 06/30/2019   Procedure: IRRIGATION AND DEBRIDEMENT PERIRECTAL ABSCESS;  Surgeon: Leafy Ro, MD;  Location: [facility];  Service: General;  Laterality: N/A;   WOUND EXPLORATION N/A 09/06/2020   Procedure: WOUND EXPLORATION;  Surgeon: Leafy Ro, MD;  Location: [facility];   Service: General;  Laterality: N/A;    Home Medications: Prior to Admission medications   Medication Sig Start Date End Date Taking? Authorizing Provider  acetaminophen (TYLENOL) 325 MG tablet Take 650 mg by mouth every 6 (six) hours as needed for moderate pain.    [provider]  clobetasol cream (TEMOVATE) 0.05 % Apply 1 application topically as directed. Once to twice daily as needed for flare. Avoid face, groin and underarms. 03/09/21   Willeen Niece, MD  dupilumab (DUPIXENT) 300 MG/2ML prefilled syringe Inject 300 mg into the skin every 14 (fourteen) days. Starting at day 15 for maintenance. 12/26/21   Deirdre Evener, MD  HYDROcodone-acetaminophen (NORCO/VICODIN) 5-325 MG tablet Take 1 tablet by mouth every 6 (six) hours as needed. 07/07/22 07/07/23  Tommi Rumps, PA-C  hydrOXYzine (ATARAX/VISTARIL) 10 MG tablet Take 1 tablet (10 mg total) by mouth 3 (three) times daily as needed for itching. 09/22/20   Donovan Kail, PA-C  ibuprofen (ADVIL) 600 MG tablet Take 1 tablet (600 mg total) by mouth every 6 (six) hours as needed. 07/07/22   Tommi Rumps, PA-C  lisinopril (ZESTRIL) 2.5 MG tablet Take 2.5 mg by mouth daily.     [provider]  mupirocin ointment (BACTROBAN) 2 % Apply 1 Application topically daily. Qd to all open sores on body 07/05/22   Deirdre Evener, MD  Ruxolitinib Phosphate (OPZELURA) 1.5 % CREA Apply 1 application topically 2 (two) times daily. Apply to rash BID 05/01/21   Deirdre Evener, MD  triamcinolone ointment (KENALOG) 0.1 % APPLY TO AFFECTED  AREA TWICE A DAY FOR 2 WEEKS, THEN DECREASE TO 5 DAYS A WEEK THEREAFTER, AVOID THE FACE, GROIN AND UNDERARMS 03/11/23   Deirdre Evener, MD  valACYclovir (VALTREX) 500 MG tablet Take 1 tablet (500 mg total) by mouth daily. 12/27/21   Michiel Cowboy A, PA-C  Zoster Vaccine Adjuvanted [facility]) injection Inject 0.5 mLs into the muscle as directed. 09/19/22   Deirdre Evener, MD    Allergies: No  Known Allergies  Review of Systems: ROS  Physical Exam There were no vitals taken for this visit.  Physical Exam Vitals and nursing note reviewed. Exam conducted with a chaperone present.  Constitutional:      General: He is not in acute distress.    Appearance: Normal appearance. He is obese. He is not ill-appearing.  HENT:     Head: Normocephalic and atraumatic.  Eyes:     General: No scleral icterus.    Conjunctiva/sclera: Conjunctivae normal.  Cardiovascular:     Rate and Rhythm: Normal rate and regular rhythm.     Pulses: Normal pulses.  Pulmonary:     Effort: Pulmonary effort is normal. No respiratory distress.  Genitourinary:    Comments: Deferred Musculoskeletal:     Right lower leg: No edema.     Left lower leg: No edema.  Skin:    General: Skin is warm and dry.     Comments: To the lower abdominal wall/groin there are two prominent areas of purulent appearing drainage. One to the suprapubic region over previous I&D site. There is a second to the left groin,. Which does not appear severe. With palpation, I am only able to express a small amount in these areas. I do not appreciate any large undrained collections. No evidence of any necrotizing infections. This is consistent with previous examinations. Additionally, he has areas of dry, flaking, erythema to the flexor surface ot he elbows bilaterally and lower anterior legs.   Neurological:     General: No focal deficit present.     Mental Status: He is alert and oriented to person, place, and time.  Psychiatric:        Mood and Affect: Mood normal.        Behavior: Behavior normal.     Labs/Imaging: No new labs or imaging  Assessment and Plan: This is a 47 y.o. male with history of recurrent HA and atopic dermatitis    - This is a rather unfortunate situation with what appears to be flare-up/recurrence of drainage from HA affecting the lower abdominal wall. I do not appreciate any undrained collections nor  necrotizing infection on examination. He has responded well with course of Abx in the past, and as such, I will send him with Bactrim.   - I will have him follow up in 1-2 weeks with one of our MDs, he prefers Dr Everlene Farrier, to re-evaluate for response/improvement. I do not feel there is a role for surgery at this time but want to ensure nothing is missed. Any "curative" efforts from a surgery front would involve massive resection which I do no think is feasible and would ultimately result in a large wound that has many barriers to healing. He is understanding of this.  - I will defer any biologic treatment to his dermatologist   Face-to-face time spent with the patient and care providers was 20 minutes, with more than 50% of the time spent counseling, educating, and coordinating care of the patient.     Lynden Oxford, PA-C [facility]  03/14/2023, 3:42 PM M-F: 7am - 4pm

## 2023-03-14 NOTE — Patient Instructions (Signed)
Cover areas and change dressings daily.  Follow up her next week with Dr Everlene Farrier.  Call if you develop any fevers or chills.  Finish all of your antibiotics that we have sent in today.

## 2023-03-20 ENCOUNTER — Encounter: Payer: Self-pay | Admitting: Surgery

## 2023-03-20 ENCOUNTER — Ambulatory Visit (INDEPENDENT_AMBULATORY_CARE_PROVIDER_SITE_OTHER): Payer: 59 | Admitting: Surgery

## 2023-03-20 VITALS — BP 130/88 | HR 68 | Temp 98.2°F | Ht 71.0 in | Wt 220.0 lb

## 2023-03-20 DIAGNOSIS — L732 Hidradenitis suppurativa: Secondary | ICD-10-CM | POA: Diagnosis not present

## 2023-03-20 MED ORDER — SULFAMETHOXAZOLE-TRIMETHOPRIM 800-160 MG PO TABS: 1.0000 | ORAL_TABLET | Freq: Two times a day (BID) | ORAL | 0 refills | Status: AC

## 2023-03-20 NOTE — Patient Instructions (Addendum)
Do warm soaks and warm moist compressions to the area 2-3 times a day. You may try using Hibiclens soap 1-2 times a week.    Try to stop smoking as this will help decrease your flair ups. You may speak with your PCP about this as there are many medications to help.    Hidradenitis Suppurativa Hidradenitis suppurativa is a long-term (chronic) skin disease that starts with blocked sweat glands or hair follicles. Bacteria may grow in these blocked openings of your skin. Hidradenitis suppurativa is like a severe form of acne that develops in areas of your body where acne would be unusual. It is most likely to affect the areas of your body where skin rubs against skin and becomes moist. This includes your: Underarms. Groin. Genital areas. Buttocks. Upper thighs. Breasts. Hidradenitis suppurativa may start out with small pimples. The pimples can develop into deep sores that break open (rupture) and drain pus. Over time your skin may thicken and become scarred. Hidradenitis suppurativa cannot be passed from person to person.  CAUSES  The exact cause of hidradenitis suppurativa is not known. This condition may be due to: Male and male hormones. The condition is rare before and after puberty. An overactive body defense system (immune system). Your immune system may overreact to the blocked hair follicles or sweat glands and cause swelling and pus-filled sores. RISK FACTORS You may have a higher risk of hidradenitis suppurativa if you: Are a woman. Are between ages 51 and 31. Have a family history of hidradenitis suppurativa. Have a personal history of acne. Are overweight. Smoke. Take the drug lithium. SIGNS AND SYMPTOMS  The first signs of an outbreak are usually painful skin bumps that look like pimples. As the condition progresses: Skin bumps may get bigger and grow deeper into the skin. Bumps under the skin may rupture and drain smelly pus. Skin may become itchy and infected. Skin may  thicken and scar. Drainage may continue through tunnels under the skin (fistulas). Walking and moving your arms can become painful. DIAGNOSIS  Your health care provider may diagnose hidradenitis suppurativa based on your medical history and your signs and symptoms. A physical exam will also be done. You may need to see a health care provider who specializes in skin diseases (dermatologist). You may also have tests done to confirm the diagnosis. These can include: Swabbing a sample of pus or drainage from your skin so it can be sent to the lab and tested for infection. Blood tests to check for infection. TREATMENT  The same treatment will not work for everybody with hidradenitis suppurativa. Your treatment will depend on how severe your symptoms are. You may need to try several treatments to find what works best for you. Part of your treatment may include cleaning and bandaging (dressing) your wounds. You may also have to take medicines, such as the following: Antibiotics. Acne medicines. Medicines to block or suppress the immune system. A diabetes medicine (metformin) is sometimes used to treat this condition. For women, birth control pills can sometimes help relieve symptoms. You may need surgery if you have a severe case of hidradenitis suppurativa that does not respond to medicine. Surgery may involve:  Using a laser to clear the skin and remove hair follicles. Opening and draining deep sores. Removing the areas of skin that are diseased and scarred. HOME CARE INSTRUCTIONS Learn as much as you can about your disease, and work closely with your health care providers. Take medicines only as directed by your health  care provider. If you were prescribed an antibiotic medicine, finish it all even if you start to feel better. If you are overweight, losing weight may be very helpful. Try to reach and maintain a healthy weight. Do not use any tobacco products, including cigarettes, chewing tobacco,  or electronic cigarettes. If you need help quitting, ask your health care provider. Do not shave the areas where you get hidradenitis suppurativa. Do not wear deodorant. Wear loose-fitting clothes. Try not to overheat and get sweaty. Take a daily bleach bath as directed by your health care provider. Fill your bathtub halfway with water. Pour in  cup of unscented household bleach. Soak for 5-10 minutes. Cover sore areas with a warm, clean washcloth (compress) for 5-10 minutes. SEEK MEDICAL CARE IF:  You have a flare-up of hidradenitis suppurativa. You have chills or a fever. You are having trouble controlling your symptoms at home.   This information is not intended to replace advice given to you by your health care provider. Make sure you discuss any questions you have with your health care provider.   Document Released: 05/08/2004 Document Revised: 10/15/2014 Document Reviewed: 12/25/2013 Elsevier Interactive Patient Education [employer]

## 2023-03-25 NOTE — Progress Notes (Signed)
Outpatient Surgical Follow Up  03/25/2023  Curtis Peterson is an 47 y.o. male.   Chief Complaint  Patient presents with   Follow-up    HPI: Curtis Peterson is a 47 y.o. male well known to our service following multiple admissions and I&Ds for hidradenitis . He has been following up with dermatology in the interim. He had been on Dupixent long term with Dr Gwen Pounds. Continues to have chronic drainage from suprapubic area as well as perineal area.  No fevers no chills.  Continues to smoke. He denied any fever, chills, nausea, emesis. He does have abdominal pain/pressure. In the past, he has noted improvement with Bactrim. His previous Cx have grown MSSA  He is diabetic.  Past Medical History:  Diagnosis Date   Diabetes mellitus without complication (HCC)    Hypertension     Past Surgical History:  Procedure Laterality Date   head lesions removed  2015   HYDRADENITIS EXCISION N/A 02/26/2020   Procedure: EXCISION HIDRADENITIS, Perineum;  Surgeon: Leafy Ro, MD;  Location: [facility];  Service: General;  Laterality: N/A;   INCISION AND DRAINAGE ABSCESS N/A 09/04/2020   Procedure: INCISION AND DRAINAGE SKIN ABSCESS;  Surgeon: Henrene Dodge, MD;  Location: [facility];  Service: General;  Laterality: N/A;  Suprapubic area   INCISION AND DRAINAGE PERIRECTAL ABSCESS N/A 06/30/2019   Procedure: IRRIGATION AND DEBRIDEMENT PERIRECTAL ABSCESS;  Surgeon: Leafy Ro, MD;  Location: [facility];  Service: General;  Laterality: N/A;   WOUND EXPLORATION N/A 09/06/2020   Procedure: WOUND EXPLORATION;  Surgeon: Leafy Ro, MD;  Location: [facility];  Service: General;  Laterality: N/A;    Family History  Problem Relation Age of Onset   Hypertension Other    Diabetes Mother    Diabetes Father     Social History:  reports that he has been smoking cigarettes. He has been smoking an average of .5 packs per day. He has been exposed to tobacco smoke. He has never used smokeless tobacco. He reports  current alcohol use of about 6.0 standard drinks of alcohol per week. He reports current drug use. Drug: Marijuana.  Allergies: No Known Allergies  Medications reviewed.    ROS Full ROS performed and is otherwise negative other than what is stated in HPI   BP 130/88   Pulse 68   Temp 98.2 F (36.8 C)   Ht 5\' 11"  (1.803 m)   Wt 220 lb (99.8 kg)   SpO2 98%   BMI 30.68 kg/m   Physical Exam Vitals and nursing note reviewed.  Constitutional:      General: He is not in acute distress.    Appearance: Normal appearance.  Pulmonary:     Effort: Pulmonary effort is normal.     Breath sounds: No stridor.  Abdominal:     General: Abdomen is flat. There is no distension.     Palpations: There is no mass.     Tenderness: There is no abdominal tenderness. There is no guarding or rebound.     Hernia: No hernia is present.     Comments: multipe areas in the suprapubic area with chronic sinuses consistent with hidradenitis without abscess or necrotizing infection  Genitourinary:    Comments: There is evidence of perineal hidradenitis.  There is chronic draining sinuses. Musculoskeletal:        General: No swelling or tenderness. Normal range of motion.  Skin:    General: Skin is warm and dry.     Capillary Refill: Capillary  refill takes less than 2 seconds.  Neurological:     General: No focal deficit present.     Mental Status: He is alert and oriented to person, place, and time.  Psychiatric:        Mood and Affect: Mood normal.        Behavior: Behavior normal.        Thought Content: Thought content normal.        Judgment: Judgment normal.      Assessment/Plan: 47 year old male with hidradenitis of the suprapubic area as well as the peritoneum and gluteal area.  No evidence of necrotizing infection there is no evidence of a well-defined abscess.  This is a very difficult and unfortunate situation as there is no good treatment for hidradenitis.  Discussed about doing a  refill of antibiotic.  Also d/w him about showring wth CHG, He will see dermatology to see if they can start on biological therapy but I think that he has had issues regarding medication approval by his insurance.  Discussed with him in detail about smoking sensation.  No need for surgical intervention at this time.  Please note that I spent 30 minutes in this encounter including personally reviewing records, imaging studies, coordinating his care, placing orders and performing appropriate documentation    Sterling Big, MD [facility] General Surgeon

## 2023-04-09 ENCOUNTER — Other Ambulatory Visit: Payer: Self-pay | Admitting: Dermatology

## 2023-04-09 DIAGNOSIS — L209 Atopic dermatitis, unspecified: Secondary | ICD-10-CM

## 2023-05-02 ENCOUNTER — Ambulatory Visit (INDEPENDENT_AMBULATORY_CARE_PROVIDER_SITE_OTHER): Payer: 59 | Admitting: Dermatology

## 2023-05-02 DIAGNOSIS — L732 Hidradenitis suppurativa: Secondary | ICD-10-CM

## 2023-05-02 DIAGNOSIS — Z79899 Other long term (current) drug therapy: Secondary | ICD-10-CM

## 2023-05-02 DIAGNOSIS — L663 Perifolliculitis capitis abscedens: Secondary | ICD-10-CM | POA: Diagnosis not present

## 2023-05-02 DIAGNOSIS — L209 Atopic dermatitis, unspecified: Secondary | ICD-10-CM

## 2023-05-02 DIAGNOSIS — L300 Nummular dermatitis: Secondary | ICD-10-CM

## 2023-05-02 MED ORDER — DUPILUMAB 300 MG/2ML ~~LOC~~ SOSY
600.0000 mg | PREFILLED_SYRINGE | Freq: Once | SUBCUTANEOUS | Status: AC
Start: 2023-05-02 — End: 2023-05-02
  Administered 2023-05-02: 600 mg via SUBCUTANEOUS

## 2023-05-02 MED ORDER — DUPIXENT 300 MG/2ML ~~LOC~~ SOAJ: 300.0000 mg | SUBCUTANEOUS | 5 refills | Status: DC

## 2023-05-02 MED ORDER — DOXYCYCLINE MONOHYDRATE 100 MG PO CAPS: 100.0000 mg | ORAL_CAPSULE | Freq: Two times a day (BID) | ORAL | 0 refills | Status: DC

## 2023-05-02 MED ORDER — CLOBETASOL PROPIONATE 0.05 % EX CREA
1.0000 | TOPICAL_CREAM | Freq: Two times a day (BID) | CUTANEOUS | 11 refills | Status: DC
Start: 2023-05-02 — End: 2023-08-06

## 2023-05-02 NOTE — Patient Instructions (Addendum)
Start doxycycline 100 mg twice daily with food.   Doxycycline should be taken with food to prevent nausea. Do not lay down for 30 minutes after taking. Be cautious with sun exposure and use good sun protection while on this medication. Pregnant women should not take this medication.   Dupilumab (Dupixent) is a treatment given by injection for adults and children with moderate-to-severe atopic dermatitis. Goal is control of skin condition, not cure. It is given as 2 injections at the first dose followed by 1 injection ever 2 weeks thereafter.  Young children are dosed monthly.  Potential side effects include allergic reaction, herpes infections, injection site reactions and conjunctivitis (inflammation of the eyes).  The use of Dupixent requires long term medication management, including periodic office visits.  Due to recent changes in healthcare laws, you may see results of your pathology and/or laboratory studies on MyChart before the doctors have had a chance to review them. We understand that in some cases there may be results that are confusing or concerning to you. Please understand that not all results are received at the same time and often the doctors may need to interpret multiple results in order to provide you with the best plan of care or course of treatment. Therefore, we ask that you please give [place] 2 business days to thoroughly review all your results before contacting the office for clarification. Should we see a critical lab result, you will be contacted sooner.   If You Need Anything After Your Visit  If you have any questions or concerns for your doctor, please call our main line at [phone] and press option 4 to reach your doctor's medical assistant. If no one answers, please leave a voicemail as directed and we will return your call as soon as possible. Messages left after 4 pm will be answered the following business day.   You may also send [place] a message via MyChart. We  typically respond to MyChart messages within 1-2 business days.  For prescription refills, please ask your pharmacy to contact our office. Our fax number is [phone].  If you have an urgent issue when the clinic is closed that cannot wait until the next business day, you can page your doctor at the number below.    Please note that while we do our best to be available for urgent issues outside of office hours, we are not available 24/7.   If you have an urgent issue and are unable to reach [place], you may choose to seek medical care at your doctor's office, retail clinic, urgent care center, or emergency room.  If you have a medical emergency, please immediately call 911 or go to the emergency department.  Pager Numbers  - Dr. Gwen Pounds: [phone]  - Dr. Neale Burly: [phone]  - Dr. Roseanne Reno: [phone]  In the event of inclement weather, please call our main line at [phone] for an update on the status of any delays or closures.  Dermatology Medication Tips: Please keep the boxes that topical medications come in in order to help keep track of the instructions about where and how to use these. Pharmacies typically print the medication instructions only on the boxes and not directly on the medication tubes.   If your medication is too expensive, please contact our office at [phone] option 4 or send [place] a message through MyChart.   We are unable to tell what your co-pay for medications will be in advance as this is different depending on your insurance coverage.  However, we may be able to find a substitute medication at lower cost or fill out paperwork to get insurance to cover a needed medication.   If a prior authorization is required to get your medication covered by your insurance company, please allow [place] 1-2 business days to complete this process.  Drug prices often vary depending on where the prescription is filled and some pharmacies may offer cheaper prices.  The  website www.goodrx.com contains coupons for medications through different pharmacies. The prices here do not account for what the cost may be with help from insurance (it may be cheaper with your insurance), but the website can give you the price if you did not use any insurance.  - You can print the associated coupon and take it with your prescription to the pharmacy.  - You may also stop by our office during regular business hours and pick up a GoodRx coupon card.  - If you need your prescription sent electronically to a different pharmacy, notify our office through [facility] or by phone at [phone] option 4.

## 2023-05-02 NOTE — Progress Notes (Addendum)
Follow Up Visit   Subjective  Curtis Peterson is a 47 y.o. male who presents for the following: HS with acne. Would like to restart Isotretinoin since it is summer and eczema may not flare. Was unable to go above 40 mg. Stopped Isotretinoin at 28 weeks of therapy, September or October 2022, due to eczema flare. Was started on Dupixent at that time.   Eczema is still flaring. Arms, legs, feet, chest. Was on Dupixent. Stopped when unable to get free medication.   The following portions of the chart were reviewed this encounter and updated as appropriate: medications, allergies, medical history  Review of Systems:  No other skin or systemic complaints except as noted in HPI or Assessment and Plan.  Objective  Well appearing patient in no apparent distress; mood and affect are within normal limits.  A focused examination was performed of the following areas:  scalp  Relevant exam findings are noted in the Assessment and Plan.  legs, feet, arms, trunk Scattered multicentimeter circular scaly erythematous plaques on trunk, arms, legs, feet. BSA 10 - 15% Crusting and excoriations with bleeding     Left Axilla, Pubic Draining sinus tracts of axillae and mons pubis  Scalp Diffuse boggy indurated plaques of scalp    Assessment & Plan   Nummular dermatitis  Related Medications clobetasol cream (TEMOVATE) 0.05 % Apply 1 Application topically 2 (two) times daily. Apply to eczema until skin is smooth, then stop. Restart as needed  Atopic dermatitis, unspecified type legs, feet, arms, trunk  Atopic dermatitis - Severe, on Dupixent (biologic medication).  Atopic dermatitis (eczema) is a chronic, relapsing, pruritic condition that can significantly affect quality of life. It is often associated with allergic rhinitis and/or asthma and can require treatment with topical medications, phototherapy, or in severe cases biologic medications, which require long term medication management.    Restart Dupixent. Loading dose injected today Dupixent 600 mg, 300 mg to left upper arm, 300 mg to right upper arm. Patient tolerated well.  Lot # [id number]   Exp: 01/2025  Start clobetasol 0.05% cream twice daily to affected areas until clear. Avoid applying to face, groin, and axilla. Use as directed. Long-term use can cause thinning of the skin.  Topical steroids (such as triamcinolone, fluocinolone, fluocinonide, mometasone, clobetasol, halobetasol, betamethasone, hydrocortisone) can cause thinning and lightening of the skin if they are used for too long in the same area. Your physician has selected the right strength medicine for your problem and area affected on the body. Please use your medication only as directed by your physician to prevent side effects.    Related Medications triamcinolone ointment (KENALOG) 0.1 % APPLY TO AFFECTED AREA TWICE A DAY FOR 2 WEEKS, THEN DECREASE TO 5 DAYS A WEEK THEREAFTER, AVOID THE FACE, GROIN AND UNDERARMS  dupilumab (DUPIXENT) prefilled syringe 600 mg   Hidradenitis suppurativa Pubic; Left Axilla  Chronic and persistent condition with duration or expected duration over one year. Condition is bothersome/symptomatic for patient. Currently flared.   Hidradenitis Suppurativa is a chronic; persistent; non-curable, but treatable condition due to abnormal inflamed sweat glands in the body folds (axilla, inframammary, groin, medial thighs), causing recurrent painful draining cysts and scarring. It can be associated with severe scarring acne and cysts; also abscesses and scarring of scalp. The goal is control and prevention of flares, as it is not curable. Scars are permanent and can be thickened. Treatment may include daily use of topical medication and oral antibiotics.  Oral isotretinoin may also be helpful.  For some cases, Humira or Cosentyx (biologic injections) may be prescribed to decrease the inflammatory process and prevent flares.  When indicated,  inflamed cysts may also be treated surgically.  Treatment Plan:  Will treat atopic dermatitis first and the address HS once dermatitis controlled.  Start doxycycline 100 mg twice daily with food.   Doxycycline should be taken with food to prevent nausea. Do not lay down for 30 minutes after taking. Be cautious with sun exposure and use good sun protection while on this medication. Pregnant women should not take this medication.   Maintenance systemic options: TNFi - no personal or family history of malignancy, tuberculosis, multiple sclerosis, congestive heart failure  JAKi - no history of DVT, PE, other VTE, cerebrovascular accident, myocardial infarction, TB  IL-17i - no personal or family history of IBD or TB  Patient will get baseline labs from [facility]: CBC diff CMP HBV HCV serologies quantiferon gold HIV  Dissecting cellulitis of scalp Scalp  Same as HS  High risk medication use  Related Procedures HIV antibody (with reflex) CBC with Differential/Platelets CMP QuantiFERON-TB Gold Plus Lipid Panel Hep C Antibody Hep B Surface Antibody Hep B Surface Antigen Hepatitis B Core AB, Total      Return in about 2 months (around 07/03/2023) for Dermatitis, Hidradenitis.  Anise Salvo, RMA, am acting as scribe for Elie Goody, MD .   Documentation: I have reviewed the above documentation for accuracy and completeness, and I agree with the above.  Elie Goody, MD

## 2023-05-03 ENCOUNTER — Other Ambulatory Visit: Payer: Self-pay | Admitting: Dermatology

## 2023-05-16 ENCOUNTER — Ambulatory Visit: Payer: 59

## 2023-05-16 ENCOUNTER — Telehealth: Payer: Self-pay

## 2023-05-16 DIAGNOSIS — L209 Atopic dermatitis, unspecified: Secondary | ICD-10-CM

## 2023-05-16 MED ORDER — DUPILUMAB 300 MG/2ML ~~LOC~~ SOSY
300.0000 mg | PREFILLED_SYRINGE | SUBCUTANEOUS | Status: AC
Start: 2023-05-16 — End: 2023-07-11

## 2023-05-16 NOTE — Telephone Encounter (Signed)
I need to place orders for patient to come in office and receive Dupixent injections every two weeks.  Are you okay if I enter them?

## 2023-07-03 ENCOUNTER — Encounter: Payer: Self-pay | Admitting: Dermatology

## 2023-07-03 ENCOUNTER — Other Ambulatory Visit: Payer: Self-pay

## 2023-07-03 ENCOUNTER — Ambulatory Visit (INDEPENDENT_AMBULATORY_CARE_PROVIDER_SITE_OTHER): Payer: 59 | Admitting: Dermatology

## 2023-07-03 DIAGNOSIS — Z79899 Other long term (current) drug therapy: Secondary | ICD-10-CM | POA: Diagnosis not present

## 2023-07-03 DIAGNOSIS — L732 Hidradenitis suppurativa: Secondary | ICD-10-CM

## 2023-07-03 DIAGNOSIS — L209 Atopic dermatitis, unspecified: Secondary | ICD-10-CM | POA: Diagnosis not present

## 2023-07-03 DIAGNOSIS — L300 Nummular dermatitis: Secondary | ICD-10-CM

## 2023-07-03 DIAGNOSIS — Z7189 Other specified counseling: Secondary | ICD-10-CM

## 2023-07-03 MED ORDER — SECUKINUMAB 300 MG/2ML ~~LOC~~ SOAJ
300.0000 mg | Freq: Once | SUBCUTANEOUS | Status: AC
Start: 2023-07-03 — End: 2023-07-03
  Administered 2023-07-03: 300 mg via SUBCUTANEOUS

## 2023-07-03 MED ORDER — COSENTYX SENSOREADY (300 MG) 150 MG/ML ~~LOC~~ SOAJ
300.0000 mg | SUBCUTANEOUS | 0 refills | Status: DC
Start: 2023-07-03 — End: 2023-10-16

## 2023-07-03 MED ORDER — COSENTYX SENSOREADY (300 MG) 150 MG/ML ~~LOC~~ SOAJ
300.0000 mg | SUBCUTANEOUS | 2 refills | Status: DC
Start: 2023-07-03 — End: 2023-08-06

## 2023-07-03 MED ORDER — SECUKINUMAB 300 MG/2ML ~~LOC~~ SOAJ
300.0000 mg | SUBCUTANEOUS | Status: AC
Start: 2023-07-03 — End: 2023-07-24
  Administered 2023-07-10: 300 mg via SUBCUTANEOUS

## 2023-07-03 MED ORDER — AMOXICILLIN-POT CLAVULANATE 875-125 MG PO TABS
1.0000 | ORAL_TABLET | Freq: Two times a day (BID) | ORAL | 2 refills | Status: DC
Start: 2023-07-03 — End: 2023-10-16

## 2023-07-03 NOTE — Patient Instructions (Addendum)
Continue Dupixent 300 mg/53mL every 2 weeks. Continue clobetasol cr twice daily to affected areas as needed prn flares. Avoid applying to face, groin, and axilla. Use as directed. Long-term use can cause thinning of the skin.  Topical steroids (such as triamcinolone, fluocinolone, fluocinonide, mometasone, clobetasol, halobetasol, betamethasone, hydrocortisone) can cause thinning and lightening of the skin if they are used for too long in the same area. Your physician has selected the right strength medicine for your problem and area affected on the body. Please use your medication only as directed by your physician to prevent side effects.   Discussed switching from Dupixent to Rinvoq. Blood work would need to be done on Rinvoq.  Recommend gentle skin care.  Start Augmentin 875-125 mg twice daily.   Due to recent changes in healthcare laws, you may see results of your pathology and/or laboratory studies on MyChart before the doctors have had a chance to review them. We understand that in some cases there may be results that are confusing or concerning to you. Please understand that not all results are received at the same time and often the doctors may need to interpret multiple results in order to provide you with the best plan of care or course of treatment. Therefore, we ask that you please give [place] 2 business days to thoroughly review all your results before contacting the office for clarification. Should we see a critical lab result, you will be contacted sooner.   If You Need Anything After Your Visit  If you have any questions or concerns for your doctor, please call our main line at [phone] and press option 4 to reach your doctor's medical assistant. If no one answers, please leave a voicemail as directed and we will return your call as soon as possible. Messages left after 4 pm will be answered the following business day.   You may also send [place] a message via MyChart. We typically  respond to MyChart messages within 1-2 business days.  For prescription refills, please ask your pharmacy to contact our office. Our fax number is [phone].  If you have an urgent issue when the clinic is closed that cannot wait until the next business day, you can page your doctor at the number below.    Please note that while we do our best to be available for urgent issues outside of office hours, we are not available 24/7.   If you have an urgent issue and are unable to reach [place], you may choose to seek medical care at your doctor's office, retail clinic, urgent care center, or emergency room.  If you have a medical emergency, please immediately call 911 or go to the emergency department.  Pager Numbers  - Dr. Gwen Pounds: [phone]  - Dr. Roseanne Reno: [phone]  - Dr. Katrinka Blazing: [phone]   In the event of inclement weather, please call our main line at [phone] for an update on the status of any delays or closures.  Dermatology Medication Tips: Please keep the boxes that topical medications come in in order to help keep track of the instructions about where and how to use these. Pharmacies typically print the medication instructions only on the boxes and not directly on the medication tubes.   If your medication is too expensive, please contact our office at [phone] option 4 or send [place] a message through MyChart.   We are unable to tell what your co-pay for medications will be in advance as this is different depending on your insurance coverage.  However, we may be able to find a substitute medication at lower cost or fill out paperwork to get insurance to cover a needed medication.   If a prior authorization is required to get your medication covered by your insurance company, please allow [place] 1-2 business days to complete this process.  Drug prices often vary depending on where the prescription is filled and some pharmacies may offer cheaper prices.  The website  www.goodrx.com contains coupons for medications through different pharmacies. The prices here do not account for what the cost may be with help from insurance (it may be cheaper with your insurance), but the website can give you the price if you did not use any insurance.  - You can print the associated coupon and take it with your prescription to the pharmacy.  - You may also stop by our office during regular business hours and pick up a GoodRx coupon card.  - If you need your prescription sent electronically to a different pharmacy, notify our office through [facility] or by phone at [phone] option 4.     Si Usted Necesita Algo Despus de Su Visita  Tambin puede enviarnos un mensaje a travs de Clinical cytogeneticist. Por lo general respondemos a los mensajes de MyChart en el transcurso de 1 a 2 das hbiles.  Para renovar recetas, por favor pida a su farmacia que se ponga en contacto con nuestra oficina. Annie Sable de fax es [place] [phone].  Si tiene un asunto urgente cuando la clnica est cerrada y que no puede esperar hasta el siguiente da hbil, puede llamar/localizar a su doctor(a) al nmero que aparece a continuacin.   Por favor, tenga en cuenta que aunque hacemos todo lo posible para estar disponibles para asuntos urgentes fuera del horario de [place], no estamos disponibles las 24 horas del da, los 7 [address]  [address] de la [place].   Si tiene un problema urgente y no puede comunicarse con nosotros, puede optar por buscar atencin mdica  en el consultorio de su doctor(a), en una clnica privada, en un centro de atencin urgente o en una sala de emergencias.  Si tiene Engineer, drilling, por favor llame inmediatamente al 911 o vaya a la sala de emergencias.  Nmeros de bper  - Dr. Gwen Pounds: [phone]  - Dra. Roseanne Reno: [phone]  - Dr. Katrinka Blazing: [phone]   En caso de inclemencias del tiempo, por favor llame a Lacy Duverney principal al [phone] para una actualizacin  sobre el [place] de cualquier retraso o cierre.  Consejos para la medicacin en dermatologa: Por favor, guarde las cajas en las que vienen los medicamentos de uso tpico para ayudarle a seguir las instrucciones sobre dnde y cmo usarlos. Las farmacias generalmente imprimen las instrucciones del medicamento slo en las cajas y no directamente en los tubos del [place].   Si su medicamento es muy caro, por favor, pngase en contacto con Rolm Gala llamando al [phone] y presione la opcin 4 o envenos un mensaje a travs de Clinical cytogeneticist.   No podemos decirle cul ser su copago por los medicamentos por adelantado ya que esto es diferente dependiendo de la cobertura de su seguro. Sin embargo, es posible que podamos encontrar un medicamento sustituto a Audiological scientist un formulario para que el seguro cubra el medicamento que se considera necesario.   Si se requiere una autorizacin previa para que su compaa de seguros [place] su medicamento, por favor permtanos de 1 a 2 das hbiles para completar [address]  Los precios  de los medicamentos varan con frecuencia dependiendo del lugar de dnde se surte la receta y alguna farmacias pueden ofrecer precios ms baratos.  El sitio web www.goodrx.com tiene cupones para medicamentos de Health and safety inspector. Los precios aqu no tienen en cuenta lo que podra costar con la ayuda del seguro (puede ser ms barato con su seguro), pero el sitio web puede darle el precio si no utiliz Tourist information centre manager.  - Puede imprimir el cupn correspondiente y llevarlo con su receta a la farmacia.  - Tambin puede pasar por nuestra oficina durante el horario de atencin regular y Education officer, museum una tarjeta de cupones de GoodRx.  - Si necesita que su receta se enve electrnicamente a una farmacia diferente, informe a nuestra oficina a travs de MyChart de [place] o por telfono llamando al [phone] y presione la opcin 4.

## 2023-07-03 NOTE — Progress Notes (Unsigned)
Follow-Up Visit   Subjective  Curtis Peterson is a 47 y.o. male who presents for the following: hidradenitis and dermatitis follow up. Patient was restarted on Dupixent at last visit 2 months ago and advises that he has been self-injecting every 2 weeks at home. He continues to have issues with Michelene Heady and the billing with his insurance company. Patient has been in contact with [facility] and they are trying to get issues straightened out. Dermatitis has improved drastically per patient since restarting Dupixent and starting clobetasol cr. Patient's next injection is due this week and he does have one at home.   Patient on doxycycline 100 mg twice daily for HS but patient advising that is not doing good. Continuing to flare at buttocks and low abdomen, active today.   The following portions of the chart were reviewed this encounter and updated as appropriate: medications, allergies, medical history  Review of Systems:  No other skin or systemic complaints except as noted in HPI or Assessment and Plan.  Objective  Well appearing patient in no apparent distress; mood and affect are within normal limits.   A focused examination was performed of the following areas: Abdomen, arms  Relevant exam findings are noted in the Assessment and Plan.    Assessment & Plan   ATOPIC DERMATITIS Exam: Scaly pink papules coalescing to plaques 5% BSA, improved from 15%  Chronic and persistent condition with duration or expected duration over one year. Condition is symptomatic/ bothersome to patient. Not currently at goal.   Atopic dermatitis (eczema) is a chronic, relapsing, pruritic condition that can significantly affect quality of life. It is often associated with allergic rhinitis and/or asthma and can require treatment with topical medications, phototherapy, or in severe cases biologic injectable medication (Dupixent; Adbry) or Oral JAK inhibitors.  Treatment Plan: Continue Dupixent 300 mg/37mL  SQ Q2 wks. Patient has an dose at home for this coming week Continue clobetasol cr twice daily to affected areas as needed prn flares. Avoid applying to face, groin, and axilla. Use as directed. Long-term use can cause thinning of the skin.  Topical steroids (such as triamcinolone, fluocinolone, fluocinonide, mometasone, clobetasol, halobetasol, betamethasone, hydrocortisone) can cause thinning and lightening of the skin if they are used for too long in the same area. Your physician has selected the right strength medicine for your problem and area affected on the body. Please use your medication only as directed by your physician to prevent side effects.   Discussed switching from Dupixent to Rinvoq if not working or if patient wants one drug that may cover atopic dermatitis and hidradenitis suppurativa. Blood work would need to be done on Rinvoq.  Recommend gentle skin care.  HIDRADENITIS SUPPURATIVA Exam: interconnected scarred sinus tracts coalescing into plaques with active purulent drainage on infrapannus and mons  Chronic and persistent condition with duration or expected duration over one year. Condition is bothersome/symptomatic for patient. Currently flared.   Hidradenitis Suppurativa is a chronic; persistent; non-curable, but treatable condition due to abnormal inflamed sweat glands in the body folds (axilla, inframammary, groin, medial thighs), causing recurrent painful draining cysts and scarring. It can be associated with severe scarring acne and cysts; also abscesses and scarring of scalp. The goal is control and prevention of flares, as it is not curable. Scars are permanent and can be thickened. Treatment may include daily use of topical medication and oral antibiotics.  Oral isotretinoin may also be helpful.  For some cases, Humira or Cosentyx (biologic injections) may be prescribed to  decrease the inflammatory process and prevent flares.  When indicated, inflamed cysts may also be  treated surgically.  Treatment Plan: 05/03/23 biologic labs normal, quant gold negative D/c doxycycline Start Augmentin 875-125 mg twice daily. It has worked for patient in the past Start Costenyx 300 mg/2 mL. Cosentyx 300 mg/2 mL to right upper arm. Patient tolerated well. NDC 1610-9604-54 Lot # [id number]   Exp: 12/05/2024  Reviewed risks of biologics including immunosuppression, infections, injection site reaction, and failure to improve condition. Goal is control of skin condition, not cure.  Some older biologics such as Humira and Enbrel may slightly increase risk of malignancy and may worsen congestive heart failure.  Taltz and Cosentyx may cause inflammatory bowel disease to flare. The use of biologics requires long term medication management, including periodic office visits and monitoring of blood work.  Return in about 1 week (around 07/10/2023) for Cosentyx sample injection with nurse, 1 month follow up with Dr. Katrinka Blazing.  Anise Salvo, RMA, am acting as scribe for Elie Goody, MD .   Documentation: I have reviewed the above documentation for accuracy and completeness, and I agree with the above.  Elie Goody, MD

## 2023-07-10 ENCOUNTER — Ambulatory Visit (INDEPENDENT_AMBULATORY_CARE_PROVIDER_SITE_OTHER): Payer: 59

## 2023-07-10 ENCOUNTER — Other Ambulatory Visit: Payer: Self-pay | Admitting: Dermatology

## 2023-07-10 DIAGNOSIS — L732 Hidradenitis suppurativa: Secondary | ICD-10-CM | POA: Diagnosis not present

## 2023-07-10 DIAGNOSIS — L209 Atopic dermatitis, unspecified: Secondary | ICD-10-CM

## 2023-07-10 NOTE — Progress Notes (Signed)
Patient here today for second loading dose injection.   Cosentyx 300mg  injected into left upper arm. Patient tolerated well. NUU:[id number]   EXP:12/05/2024   NDC 6440-3474-25  Dorathy Daft, RMA

## 2023-07-17 ENCOUNTER — Ambulatory Visit (INDEPENDENT_AMBULATORY_CARE_PROVIDER_SITE_OTHER): Payer: 59

## 2023-07-17 DIAGNOSIS — L732 Hidradenitis suppurativa: Secondary | ICD-10-CM | POA: Diagnosis not present

## 2023-07-17 MED ORDER — SECUKINUMAB (300 MG DOSE) 150 MG/ML ~~LOC~~ SOAJ
300.0000 mg | Freq: Once | SUBCUTANEOUS | Status: AC
Start: 2023-07-17 — End: 2023-07-17
  Administered 2023-07-17: 300 mg via SUBCUTANEOUS

## 2023-07-17 NOTE — Progress Notes (Signed)
Patient here today for third loading dose injection.    Cosentyx 300mg  (2 150mg  pens) injected into right and left upper arm. Patient tolerated well.  LOT: [id number]   EXP: 07/2024   NDC 1478-2956-21   Dorathy Daft, RMA

## 2023-07-23 ENCOUNTER — Ambulatory Visit: Payer: 59 | Admitting: Dermatology

## 2023-07-24 ENCOUNTER — Ambulatory Visit: Payer: 59

## 2023-08-06 ENCOUNTER — Ambulatory Visit: Payer: 59 | Admitting: Dermatology

## 2023-08-06 ENCOUNTER — Encounter: Payer: Self-pay | Admitting: Dermatology

## 2023-08-06 DIAGNOSIS — Z7189 Other specified counseling: Secondary | ICD-10-CM

## 2023-08-06 DIAGNOSIS — Z79899 Other long term (current) drug therapy: Secondary | ICD-10-CM

## 2023-08-06 DIAGNOSIS — L209 Atopic dermatitis, unspecified: Secondary | ICD-10-CM | POA: Diagnosis not present

## 2023-08-06 DIAGNOSIS — L300 Nummular dermatitis: Secondary | ICD-10-CM | POA: Diagnosis not present

## 2023-08-06 DIAGNOSIS — L732 Hidradenitis suppurativa: Secondary | ICD-10-CM | POA: Diagnosis not present

## 2023-08-06 MED ORDER — COSENTYX SENSOREADY (300 MG) 150 MG/ML ~~LOC~~ SOAJ: 300.0000 mg | SUBCUTANEOUS | 5 refills | Status: DC

## 2023-08-06 MED ORDER — CLOBETASOL PROPIONATE 0.05 % EX CREA: 1.0000 | TOPICAL_CREAM | Freq: Two times a day (BID) | CUTANEOUS | 11 refills | Status: DC

## 2023-08-06 MED ORDER — DUPIXENT 300 MG/2ML ~~LOC~~ SOAJ: 300.0000 mg | SUBCUTANEOUS | 5 refills | Status: DC

## 2023-08-06 NOTE — Patient Instructions (Addendum)
Continue Dupixent 300 mg/61mL every 2 weeks.   Continue clobetasol cr twice daily to affected areas as needed prn flares. Avoid applying to face, groin, and axilla. Use as directed. Long-term use can cause thinning of the skin.  Continue Augmentin 875-125 mg twice daily.  Continue Costenyx 300 mg/2 mL every 28 days, after finishing loading dose this week.  Dupilumab (Dupixent) is a treatment given by injection for adults and children with moderate-to-severe atopic dermatitis. Goal is control of skin condition, not cure. It is given as 2 injections at the first dose followed by 1 injection ever 2 weeks thereafter.  Young children are dosed monthly.  Potential side effects include allergic reaction, herpes infections, injection site reactions and conjunctivitis (inflammation of the eyes).  The use of Dupixent requires long term medication management, including periodic office visits.   Reviewed risks of biologics including immunosuppression, infections, injection site reaction, and failure to improve condition. Goal is control of skin condition, not cure.  Some older biologics such as Humira and Enbrel may slightly increase risk of malignancy and may worsen congestive heart failure.  Taltz and Cosentyx may cause inflammatory bowel disease to flare. The use of biologics requires long term medication management, including periodic office visits and monitoring of blood work.  Topical steroids (such as triamcinolone, fluocinolone, fluocinonide, mometasone, clobetasol, halobetasol, betamethasone, hydrocortisone) can cause thinning and lightening of the skin if they are used for too long in the same area. Your physician has selected the right strength medicine for your problem and area affected on the body. Please use your medication only as directed by your physician to prevent side effects.      Gentle Skin Care Guide  1. Bathe no more than once a day.  2. Avoid bathing in hot water  3. Use a mild  soap like Dove, Vanicream, Cetaphil, CeraVe. Can use Lever 2000 or Cetaphil antibacterial soap  4. Use soap only where you need it. On most days, use it under your arms, between your legs, and on your feet. Let the water rinse other areas unless visibly dirty.  5. When you get out of the bath/shower, use a towel to gently blot your skin dry, don't rub it.  6. While your skin is still a little damp, apply a moisturizing cream such as Vanicream, CeraVe, Cetaphil, Eucerin, Sarna lotion or plain Vaseline Jelly. For hands apply Neutrogena [place] Hand Cream or Excipial Hand Cream.  7. Reapply moisturizer any time you start to itch or feel dry.  8. Sometimes using free and clear laundry detergents can be helpful. Fabric softener sheets should be avoided. Downy Free & Gentle liquid, or any liquid fabric softener that is free of dyes and perfumes, it acceptable to use  9. If your doctor has given you prescription creams you may apply moisturizers over them       Due to recent changes in healthcare laws, you may see results of your pathology and/or laboratory studies on MyChart before the doctors have had a chance to review them. We understand that in some cases there may be results that are confusing or concerning to you. Please understand that not all results are received at the same time and often the doctors may need to interpret multiple results in order to provide you with the best plan of care or course of treatment. Therefore, we ask that you please give [place] 2 business days to thoroughly review all your results before contacting the office for clarification. Should we see a  critical lab result, you will be contacted sooner.   If You Need Anything After Your Visit  If you have any questions or concerns for your doctor, please call our main line at [phone] and press option 4 to reach your doctor's medical assistant. If no one answers, please leave a voicemail as directed and we will return  your call as soon as possible. Messages left after 4 pm will be answered the following business day.   You may also send [place] a message via MyChart. We typically respond to MyChart messages within 1-2 business days.  For prescription refills, please ask your pharmacy to contact our office. Our fax number is [phone].  If you have an urgent issue when the clinic is closed that cannot wait until the next business day, you can page your doctor at the number below.    Please note that while we do our best to be available for urgent issues outside of office hours, we are not available 24/7.   If you have an urgent issue and are unable to reach [place], you may choose to seek medical care at your doctor's office, retail clinic, urgent care center, or emergency room.  If you have a medical emergency, please immediately call 911 or go to the emergency department.  Pager Numbers  - Dr. Gwen Pounds: [phone]  - Dr. Roseanne Reno: [phone]  - Dr. Katrinka Blazing: [phone]   In the event of inclement weather, please call our main line at [phone] for an update on the status of any delays or closures.  Dermatology Medication Tips: Please keep the boxes that topical medications come in in order to help keep track of the instructions about where and how to use these. Pharmacies typically print the medication instructions only on the boxes and not directly on the medication tubes.   If your medication is too expensive, please contact our office at [phone] option 4 or send [place] a message through MyChart.   We are unable to tell what your co-pay for medications will be in advance as this is different depending on your insurance coverage. However, we may be able to find a substitute medication at lower cost or fill out paperwork to get insurance to cover a needed medication.   If a prior authorization is required to get your medication covered by your insurance company, please allow [place] 1-2 business days to  complete this process.  Drug prices often vary depending on where the prescription is filled and some pharmacies may offer cheaper prices.  The website www.goodrx.com contains coupons for medications through different pharmacies. The prices here do not account for what the cost may be with help from insurance (it may be cheaper with your insurance), but the website can give you the price if you did not use any insurance.  - You can print the associated coupon and take it with your prescription to the pharmacy.  - You may also stop by our office during regular business hours and pick up a GoodRx coupon card.  - If you need your prescription sent electronically to a different pharmacy, notify our office through [facility] or by phone at [phone] option 4.     Si Usted Necesita Algo Despus de Su Visita  Tambin puede enviarnos un mensaje a travs de Clinical cytogeneticist. Por lo general respondemos a los mensajes de MyChart en el transcurso de 1 a 2 das hbiles.  Para renovar recetas, por favor pida a su farmacia que se ponga en contacto con  nuestra oficina. Annie Sable de fax es [place] [phone].  Si tiene un asunto urgente cuando la clnica est cerrada y que no puede esperar hasta el siguiente da hbil, puede llamar/localizar a su doctor(a) al nmero que aparece a continuacin.   Por favor, tenga en cuenta que aunque hacemos todo lo posible para estar disponibles para asuntos urgentes fuera del horario de [place], no estamos disponibles las 24 horas del da, los 7 [address]  [address] de la [place].   Si tiene un problema urgente y no puede comunicarse con nosotros, puede optar por buscar atencin mdica  en el consultorio de su doctor(a), en una clnica privada, en un centro de atencin urgente o en una sala de emergencias.  Si tiene Engineer, drilling, por favor llame inmediatamente al 911 o vaya a la sala de emergencias.  Nmeros de bper  - Dr. Gwen Pounds: [phone]  - Dra. Roseanne Reno:  [phone]  - Dr. Katrinka Blazing: [phone]   En caso de inclemencias del tiempo, por favor llame a Lacy Duverney principal al [phone] para una actualizacin sobre el [place] de cualquier retraso o cierre.  Consejos para la medicacin en dermatologa: Por favor, guarde las cajas en las que vienen los medicamentos de uso tpico para ayudarle a seguir las instrucciones sobre dnde y cmo usarlos. Las farmacias generalmente imprimen las instrucciones del medicamento slo en las cajas y no directamente en los tubos del [place].   Si su medicamento es muy caro, por favor, pngase en contacto con Rolm Gala llamando al [phone] y presione la opcin 4 o envenos un mensaje a travs de Clinical cytogeneticist.   No podemos decirle cul ser su copago por los medicamentos por adelantado ya que esto es diferente dependiendo de la cobertura de su seguro. Sin embargo, es posible que podamos encontrar un medicamento sustituto a Audiological scientist un formulario para que el seguro cubra el medicamento que se considera necesario.   Si se requiere una autorizacin previa para que su compaa de seguros [place] su medicamento, por favor permtanos de 1 a 2 das hbiles para completar [address]  Los precios de los medicamentos varan con frecuencia dependiendo del Environmental consultant de dnde se surte la receta y alguna farmacias pueden ofrecer precios ms baratos.  El sitio web www.goodrx.com tiene cupones para medicamentos de Health and safety inspector. Los precios aqu no tienen en cuenta lo que podra costar con la ayuda del seguro (puede ser ms barato con su seguro), pero el sitio web puede darle el precio si no utiliz Tourist information centre manager.  - Puede imprimir el cupn correspondiente y llevarlo con su receta a la farmacia.  - Tambin puede pasar por nuestra oficina durante el horario de atencin regular y Education officer, museum una tarjeta de cupones de GoodRx.  - Si necesita que su receta se enve electrnicamente a una farmacia diferente, informe  a nuestra oficina a travs de MyChart de [place] o por telfono llamando al [phone] y presione la opcin 4.

## 2023-08-06 NOTE — Progress Notes (Signed)
Follow-Up Visit   Subjective  Curtis Peterson is a 46 y.o. male who presents for the following: Atopic dermatitis. Restarted Dupixent 04/2023. Has been self injecting at home every 2 weeks. Tolerating well. Denies injection site reactions, side effects or adverse reactions. Using Clobetasol cream as directed/as needed. States he is not as itchy as before.   HS recheck. Started Cosentyx 300 mg 07/03/2023. Will finish loading doses this Thursday, injecting at home. Is now on monthly injections. Taking Augmentin as directed. Tolerating well. Denies injection site reactions, side effects or adverse reactions. Drainage has improved.     The following portions of the chart were reviewed this encounter and updated as appropriate: medications, allergies, medical history  Review of Systems:  No other skin or systemic complaints except as noted in HPI or Assessment and Plan.  Objective  Well appearing patient in no apparent distress; mood and affect are within normal limits.  A focused examination was performed of the following areas: Face, arms, groin  Relevant exam findings are noted in the Assessment and Plan.    Assessment & Plan   ATOPIC DERMATITIS Exam: Scaly pink papules coalescing to plaques 5% BSA, improved from 15%   Chronic and persistent condition with duration or expected duration over one year. Condition is symptomatic/ bothersome to patient. Not currently at goal.     Atopic dermatitis (eczema) is a chronic, relapsing, pruritic condition that can significantly affect quality of life. It is often associated with allergic rhinitis and/or asthma and can require treatment with topical medications, phototherapy, or in severe cases biologic injectable medication (Dupixent; Adbry) or Oral JAK inhibitors.   Treatment Plan: Continue Dupixent 300 mg/92mL SQ Q2 wks. Patient has an dose at home for this coming week Continue clobetasol cr twice daily to affected areas as needed prn  flares. Avoid applying to face, groin, and axilla. Use as directed. Long-term use can cause thinning of the skin.   Topical steroids (such as triamcinolone, fluocinolone, fluocinonide, mometasone, clobetasol, halobetasol, betamethasone, hydrocortisone) can cause thinning and lightening of the skin if they are used for too long in the same area. Your physician has selected the right strength medicine for your problem and area affected on the body. Please use your medication only as directed by your physician to prevent side effects.    Discussed switching from Dupixent to Rinvoq if not working or if patient wants one drug that may cover atopic dermatitis and hidradenitis suppurativa. Blood work would need to be done on Rinvoq.   Recommend gentle skin care.   HIDRADENITIS SUPPURATIVA Exam: interconnected scarred sinus tracts coalescing into plaques with reduced purulent drainage on infrapannus and mons   Chronic and persistent condition with duration or expected duration over one year. Condition is bothersome/symptomatic for patient. Currently flared.     Hidradenitis Suppurativa is a chronic; persistent; non-curable, but treatable condition due to abnormal inflamed sweat glands in the body folds (axilla, inframammary, groin, medial thighs), causing recurrent painful draining cysts and scarring. It can be associated with severe scarring acne and cysts; also abscesses and scarring of scalp. The goal is control and prevention of flares, as it is not curable. Scars are permanent and can be thickened. Treatment may include daily use of topical medication and oral antibiotics.  Oral isotretinoin may also be helpful.  For some cases, Humira or Cosentyx (biologic injections) may be prescribed to decrease the inflammatory process and prevent flares.  When indicated, inflamed cysts may also be treated surgically.   Treatment Plan:  Continue Augmentin 875-125 mg twice daily. Contact us for refill if flaring  after finishing course Continue Costenyx 300 mg/2 mL every 28 days, after finishing loading dose this week.    Reviewed risks of biologics including immunosuppression, infections, injection site reaction, and failure to improve condition. Goal is control of skin condition, not cure.  Some older biologics such as Humira and Enbrel may slightly increase risk of malignancy and may worsen congestive heart failure.  Taltz and Cosentyx may cause inflammatory bowel disease to flare. The use of biologics requires long term medication management, including periodic office visits and monitoring of blood work. Nummular dermatitis  Related Medications clobetasol cream (TEMOVATE) 0.05 % Apply 1 Application topically 2 (two) times daily. Apply to eczema until skin is smooth, then stop. Restart as needed  Hidradenitis suppurativa  Related Medications amoxicillin-clavulanate (AUGMENTIN) 875-125 MG tablet Take 1 tablet by mouth 2 (two) times daily.  Secukinumab, 300 MG Dose, (COSENTYX SENSOREADY, 300 MG,) 150 MG/ML SOAJ Inject 2 mLs (300 mg total) into the skin as directed. On week 0, 1, 2, 3 and 4.  Secukinumab, 300 MG Dose, (COSENTYX SENSOREADY, 300 MG,) 150 MG/ML SOAJ Inject 2 mLs (300 mg total) into the skin every 28 (twenty-eight) days. For maintenance.  Long-term use of high-risk medication  Related Medications amoxicillin-clavulanate (AUGMENTIN) 875-125 MG tablet Take 1 tablet by mouth 2 (two) times daily.  Secukinumab, 300 MG Dose, (COSENTYX SENSOREADY, 300 MG,) 150 MG/ML SOAJ Inject 2 mLs (300 mg total) into the skin as directed. On week 0, 1, 2, 3 and 4.  Secukinumab, 300 MG Dose, (COSENTYX SENSOREADY, 300 MG,) 150 MG/ML SOAJ Inject 2 mLs (300 mg total) into the skin every 28 (twenty-eight) days. For maintenance.  Counseling and coordination of care  Related Medications amoxicillin-clavulanate (AUGMENTIN) 875-125 MG tablet Take 1 tablet by mouth 2 (two) times daily.  Secukinumab,  300 MG Dose, (COSENTYX SENSOREADY, 300 MG,) 150 MG/ML SOAJ Inject 2 mLs (300 mg total) into the skin as directed. On week 0, 1, 2, 3 and 4.  Secukinumab, 300 MG Dose, (COSENTYX SENSOREADY, 300 MG,) 150 MG/ML SOAJ Inject 2 mLs (300 mg total) into the skin every 28 (twenty-eight) days. For maintenance.    Return in about 6 months (around 02/04/2024) for HS Recheck, Atopic Dermatitis Follow Up.  I, Lawson Radar, CMA, am acting as scribe for Elie Goody, MD.   Documentation: I have reviewed the above documentation for accuracy and completeness, and I agree with the above.  Elie Goody, MD

## 2023-08-11 ENCOUNTER — Encounter: Payer: Self-pay | Admitting: Dermatology

## 2023-08-14 NOTE — Progress Notes (Deleted)
08/15/2023 8:03 AM   Curtis Peterson January 30, 1976 [id number]  Referring provider: Barbette Reichmann, MD [address] [facility] [place],  [place] [address]  Urological history 1. 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 BPH  -PSA (10/2022) 1.20   No chief complaint on file.    HPI: Curtis Peterson is a 47 y.o. male who presents today for a yearly recheck with his SO, Curtis Peterson.    Previous records reviewed.   PMH: Past Medical History:  Diagnosis Date   Diabetes mellitus without complication (HCC)    Hypertension     Surgical History: Past Surgical History:  Procedure Laterality Date   head lesions removed  2015   HYDRADENITIS EXCISION N/A 02/26/2020   Procedure: EXCISION HIDRADENITIS, Perineum;  Surgeon: Leafy Ro, MD;  Location: [facility];  Service: General;  Laterality: N/A;   INCISION AND DRAINAGE ABSCESS N/A 09/04/2020   Procedure: INCISION AND DRAINAGE SKIN ABSCESS;  Surgeon: Henrene Dodge, MD;  Location: [facility];  Service: General;  Laterality: N/A;  Suprapubic area   INCISION AND DRAINAGE PERIRECTAL ABSCESS N/A 06/30/2019   Procedure: IRRIGATION AND DEBRIDEMENT PERIRECTAL ABSCESS;  Surgeon: Leafy Ro, MD;  Location: [facility];  Service: General;  Laterality: N/A;   WOUND EXPLORATION N/A 09/06/2020   Procedure: WOUND EXPLORATION;  Surgeon: Leafy Ro, MD;  Location: [facility];  Service: General;  Laterality: N/A;    Home Medications:  Allergies as of 08/15/2023   No Known Allergies      Medication List        Accurate as of August 14, 2023  8:03 AM. If you have any questions, ask your nurse or doctor.          amoxicillin-clavulanate 875-125 MG tablet Commonly known as: AUGMENTIN Take 1 tablet by mouth 2 (two) times daily.   clobetasol cream 0.05 % Commonly known as: TEMOVATE Apply 1 Application topically 2 (two) times daily. Apply to eczema until skin is smooth, then stop. Restart as needed   Cosentyx  Sensoready (300 MG) 150 MG/ML Soaj Generic drug: Secukinumab (300 MG Dose) Inject 2 mLs (300 mg total) into the skin as directed. On week 0, 1, 2, 3 and 4.   Cosentyx Sensoready (300 MG) 150 MG/ML Soaj Generic drug: Secukinumab (300 MG Dose) Inject 2 mLs (300 mg total) into the skin every 28 (twenty-eight) days. For maintenance.   doxycycline 100 MG capsule Commonly known as: MONODOX Take 1 capsule (100 mg total) by mouth 2 (two) times daily. Take with food   Dupixent 300 MG/2ML Soaj Generic drug: Dupilumab Inject 300 mg into the skin every 14 (fourteen) days. Starting at day 15 for maintenance.   triamcinolone ointment 0.1 % Commonly known as: KENALOG APPLY TO AFFECTED AREA TWICE A DAY X2WKS, THEN DECREASE TO 5 DAYS A WK THEREAFTER, AVOID THE FACE, GROIN &UNDERARMS   valACYclovir 500 MG tablet Commonly known as: VALTREX Take 1 tablet (500 mg total) by mouth daily.        Allergies: No Known Allergies  Family History: Family History  Problem Relation Age of Onset   Hypertension Other    Diabetes Mother    Diabetes Father     Social History:  reports that he has been smoking cigarettes. He has been exposed to tobacco smoke. He has never used smokeless tobacco. He reports current alcohol use of about 6.0 standard drinks of alcohol per week. He reports current drug use. Drug: Marijuana.   ROS  Physical Exam: There were no vitals taken for this visit.  Constitutional:  Well nourished. Alert and oriented, No acute distress. HEENT: [place] AT, mask in place.  Trachea midline Cardiovascular: No clubbing, cyanosis, or edema. Respiratory: Normal respiratory effort, no increased work of breathing. GU: No CVA tenderness.  No bladder fullness or masses.  Patient with uncircumcised phallus. Foreskin easily retracted  Urethral meatus is patent.  No penile discharge. No penile lesions or rashes. Scrotum without lesions, cysts, rashes and/or edema.  Testicles are located scrotally  bilaterally. No masses are appreciated in the testicles. Left and right epididymis are normal. Neurologic: Grossly intact, no focal deficits, moving all 4 extremities. Psychiatric: Normal mood and affect.   Laboratory Data: Results for orders placed or performed in visit on 05/03/23  CBC with Differential/Platelet  Result Value Ref Range   WBC 7.0 3.4 - 10.8 x10E3/uL   RBC 3.96 (L) 4.14 - 5.80 x10E6/uL   Hemoglobin 13.5 13.0 - 17.7 g/dL   Hematocrit 01.0 27.2 - 51.0 %   MCV 101 (H) 79 - 97 fL   MCH 34.1 (H) 26.6 - 33.0 pg   MCHC 33.8 31.5 - 35.7 g/dL   RDW 53.6 64.4 - 03.4 %   Platelets 221 150 - 450 x10E3/uL   Neutrophils 69 Not Estab. %   Lymphs 17 Not Estab. %   Monocytes 11 Not Estab. %   Eos 2 Not Estab. %   Basos 1 Not Estab. %   Neutrophils Absolute 4.8 1.4 - 7.0 x10E3/uL   Lymphocytes Absolute 1.2 0.7 - 3.1 x10E3/uL   Monocytes Absolute 0.8 0.1 - 0.9 x10E3/uL   EOS (ABSOLUTE) 0.2 0.0 - 0.4 x10E3/uL   Basophils Absolute 0.1 0.0 - 0.2 x10E3/uL   Immature Granulocytes 0 Not Estab. %   Immature Grans (Abs) 0.0 0.0 - 0.1 x10E3/uL  Comprehensive metabolic panel  Result Value Ref Range   Glucose 121 (H) 70 - 99 mg/dL   BUN 11 6 - 24 mg/dL   Creatinine, Ser 7.42 (L) 0.76 - 1.27 mg/dL   eGFR 595 >63 OV/FIE/3.32   BUN/Creatinine Ratio 16 9 - 20   Sodium 142 134 - 144 mmol/L   Potassium 4.4 3.5 - 5.2 mmol/L   Chloride 106 96 - 106 mmol/L   CO2 24 20 - 29 mmol/L   Calcium 9.5 8.7 - 10.2 mg/dL   Total Protein 7.5 6.0 - 8.5 g/dL   Albumin 3.5 (L) 4.1 - 5.1 g/dL   Globulin, Total 4.0 1.5 - 4.5 g/dL   Bilirubin Total 0.4 0.0 - 1.2 mg/dL   Alkaline Phosphatase 106 44 - 121 IU/L   AST 75 (H) 0 - 40 IU/L   ALT 65 (H) 0 - 44 IU/L  Lipid panel  Result Value Ref Range   Cholesterol, Total 124 100 - 199 mg/dL   Triglycerides 951 0 - 149 mg/dL   HDL 53 >88 mg/dL   VLDL Cholesterol Cal 21 5 - 40 mg/dL   LDL Chol Calc (NIH) 50 0 - 99 mg/dL   Chol/HDL Ratio 2.3 0.0 - 5.0 ratio   Hepatitis B surface antibody,qualitative  Result Value Ref Range   Hep B Surface Ab, Qual Non Reactive   HIV Antibody (routine testing w rflx)  Result Value Ref Range   HIV Screen 4th Generation wRfx Non Reactive Non Reactive  Hepatitis C antibody  Result Value Ref Range   Hep C Virus Ab Non Reactive Non Reactive  Hepatitis B surface antigen  Result Value  Ref Range   Hepatitis B Surface Ag Negative Negative  Hepatitis B core antibody, total  Result Value Ref Range   Hep B Core Total Ab Negative Negative   Lab Results  Component Value Date   WBC 7.0 05/03/2023   HGB 13.5 05/03/2023   HCT 39.9 05/03/2023   MCV 101 (H) 05/03/2023   PLT 221 05/03/2023    Lab Results  Component Value Date   CREATININE 0.68 (L) 05/03/2023    Lab Results  Component Value Date   HGBA1C 6.0 (H) 09/03/2020    Lab Results  Component Value Date   AST 75 (H) 05/03/2023   Lab Results  Component Value Date   ALT 65 (H) 05/03/2023   Urinalysis Urinalysis w/Microscopic Order: [id number] Component Ref Range & Units 9 mo ago  Color Colorless, Straw, Light Yellow, Yellow, Dark Yellow Yellow  Clarity Clear Clear  Specific Gravity 1.005 - 1.030 1.024  pH, Urine 5.0 - 8.0 5.5  Protein, Urinalysis Negative mg/dL 2+ Abnormal   Glucose, Urinalysis Negative mg/dL Negative  Ketones, Urinalysis Negative mg/dL 1+ Abnormal   Blood, Urinalysis Negative Negative  Nitrite, Urinalysis Negative Negative  Leukocyte Esterase, Urinalysis Negative Negative  Bilirubin, Urinalysis Negative Negative  Urobilinogen, Urinalysis 0.2 - 1.0 mg/dL 0.2  WBC, UA <=5 /hpf 1  Red Blood Cells, Urinalysis <=3 /hpf 1  Bacteria, Urinalysis 0 - 5 /hpf 0-5  Squamous Epithelial Cells, Urinalysis /hpf 0  Resulting Agency [facility]   Specimen Collected: 11/05/22 10:08   Performed by: [facility] Last Resulted: 11/05/22 14:55  Received From: [facility]  Result  Received: 12/31/22 10:34  I have reviewed the labs.   Pertinent Imaging: No recent imaging    Assessment & Plan:    1. Genital herpes -continue Valtrex 500 mg daily -refill given  2. BPH with LU TS -PSA obtained by PCP normal   No follow-ups on file.  These notes generated with voice recognition software. I apologize for typographical errors.  Michiel Cowboy, PA-C  [facility] [address]  [address] [place], [place] [address] [phone]

## 2023-08-15 ENCOUNTER — Ambulatory Visit: Payer: 59 | Admitting: Urology

## 2023-08-15 DIAGNOSIS — A6002 Herpesviral infection of other male genital organs: Secondary | ICD-10-CM

## 2023-08-15 DIAGNOSIS — N138 Other obstructive and reflux uropathy: Secondary | ICD-10-CM

## 2023-08-15 NOTE — Progress Notes (Signed)
08/20/2023 1:12 PM   Curtis Peterson [birth year] [id number]  Referring provider: Barbette Reichmann, MD [address] [facility] [place],  [place] [address]  Urological history 1. 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 BPH  -PSA (10/2022) 1.20   Chief Complaint  Patient presents with   Follow-up     HPI: Curtis Peterson is a 47 y.o. male who presents today for a yearly recheck.  Previous records reviewed.  He has not had any herpes breakout since he has been taking the Valtrex 500 mg daily.    Patient denies any modifying or aggravating factors.  Patient denies any recent UTI's, gross hematuria, dysuria or suprapubic/flank pain.  Patient denies any fevers, chills, nausea or vomiting.    He still having spontaneous erections.  He has no pain with erections.  He has no curvatures with erections.  He is having satisfactory intercourse.  There is no pain with ejaculation.  And the ejaculate fluid is normal.  PMH: Past Medical History:  Diagnosis Date   Diabetes mellitus without complication (HCC)    Hypertension     Surgical History: Past Surgical History:  Procedure Laterality Date   head lesions removed  2015   HYDRADENITIS EXCISION N/A 02/26/2020   Procedure: EXCISION HIDRADENITIS, Perineum;  Surgeon: Leafy Ro, MD;  Location: [facility];  Service: General;  Laterality: N/A;   INCISION AND DRAINAGE ABSCESS N/A 09/04/2020   Procedure: INCISION AND DRAINAGE SKIN ABSCESS;  Surgeon: Henrene Dodge, MD;  Location: [facility];  Service: General;  Laterality: N/A;  Suprapubic area   INCISION AND DRAINAGE PERIRECTAL ABSCESS N/A 06/30/2019   Procedure: IRRIGATION AND DEBRIDEMENT PERIRECTAL ABSCESS;  Surgeon: Leafy Ro, MD;  Location: [facility];  Service: General;  Laterality: N/A;   WOUND EXPLORATION N/A 09/06/2020   Procedure: WOUND EXPLORATION;  Surgeon: Leafy Ro, MD;  Location: [facility];  Service: General;  Laterality: N/A;    Home  Medications:  Allergies as of 08/20/2023   No Known Allergies      Medication List        Accurate as of August 20, 2023  1:12 PM. If you have any questions, ask your nurse or doctor.          amoxicillin-clavulanate 875-125 MG tablet Commonly known as: AUGMENTIN Take 1 tablet by mouth 2 (two) times daily.   clobetasol cream 0.05 % Commonly known as: TEMOVATE Apply 1 Application topically 2 (two) times daily. Apply to eczema until skin is smooth, then stop. Restart as needed   Cosentyx Sensoready (300 MG) 150 MG/ML Soaj Generic drug: Secukinumab (300 MG Dose) Inject 2 mLs (300 mg total) into the skin as directed. On week 0, 1, 2, 3 and 4.   Cosentyx Sensoready (300 MG) 150 MG/ML Soaj Generic drug: Secukinumab (300 MG Dose) Inject 2 mLs (300 mg total) into the skin every 28 (twenty-eight) days. For maintenance.   doxycycline 100 MG capsule Commonly known as: MONODOX Take 1 capsule (100 mg total) by mouth 2 (two) times daily. Take with food   Dupixent 300 MG/2ML Soaj Generic drug: Dupilumab Inject 300 mg into the skin every 14 (fourteen) days. Starting at day 15 for maintenance.   triamcinolone ointment 0.1 % Commonly known as: KENALOG APPLY TO AFFECTED AREA TWICE A DAY X2WKS, THEN DECREASE TO 5 DAYS A WK THEREAFTER, AVOID THE FACE, GROIN &UNDERARMS   valACYclovir 500 MG tablet Commonly known as: VALTREX Take 1 tablet (500 mg total) by mouth  daily.        Allergies: No Known Allergies  Family History: Family History  Problem Relation Age of Onset   Hypertension Other    Diabetes Mother    Diabetes Father     Social History:  reports that he has been smoking cigarettes. He has been exposed to tobacco smoke. He has never used smokeless tobacco. He reports current alcohol use of about 6.0 standard drinks of alcohol per week. He reports current drug use. Drug: Marijuana.   ROS   Physical Exam: BP (!) 143/96   Pulse 76   Ht 5\' 11"  (1.803 m)   Wt 224  lb (101.6 kg)   BMI 31.24 kg/m   Constitutional:  Well nourished. Alert and oriented, No acute distress. HEENT: [place] AT, mask in place.  Trachea midline Cardiovascular: No clubbing, cyanosis, or edema. Respiratory: Normal respiratory effort, no increased work of breathing. GU: No CVA tenderness.  No bladder fullness or masses.  Patient with uncircumcised phallus. Foreskin easily retracted  Urethral meatus is patent.  No penile discharge. No penile lesions or rashes. Scrotum without lesions, cysts, rashes and/or edema.  Testicles are located scrotally bilaterally. No masses are appreciated in the testicles. Left and right epididymis are normal. Neurologic: Grossly intact, no focal deficits, moving all 4 extremities. Psychiatric: Normal mood and affect.   Laboratory Data: Results for orders placed or performed in visit on 05/03/23  CBC with Differential/Platelet  Result Value Ref Range   WBC 7.0 3.4 - 10.8 x10E3/uL   RBC 3.96 (L) 4.14 - 5.80 x10E6/uL   Hemoglobin 13.5 13.0 - 17.7 g/dL   Hematocrit 01.0 27.2 - 51.0 %   MCV 101 (H) 79 - 97 fL   MCH 34.1 (H) 26.6 - 33.0 pg   MCHC 33.8 31.5 - 35.7 g/dL   RDW 53.6 64.4 - 03.4 %   Platelets 221 150 - 450 x10E3/uL   Neutrophils 69 Not Estab. %   Lymphs 17 Not Estab. %   Monocytes 11 Not Estab. %   Eos 2 Not Estab. %   Basos 1 Not Estab. %   Neutrophils Absolute 4.8 1.4 - 7.0 x10E3/uL   Lymphocytes Absolute 1.2 0.7 - 3.1 x10E3/uL   Monocytes Absolute 0.8 0.1 - 0.9 x10E3/uL   EOS (ABSOLUTE) 0.2 0.0 - 0.4 x10E3/uL   Basophils Absolute 0.1 0.0 - 0.2 x10E3/uL   Immature Granulocytes 0 Not Estab. %   Immature Grans (Abs) 0.0 0.0 - 0.1 x10E3/uL  Comprehensive metabolic panel  Result Value Ref Range   Glucose 121 (H) 70 - 99 mg/dL   BUN 11 6 - 24 mg/dL   Creatinine, Ser 7.42 (L) 0.76 - 1.27 mg/dL   eGFR 595 >63 OV/FIE/3.32   BUN/Creatinine Ratio 16 9 - 20   Sodium 142 134 - 144 mmol/L   Potassium 4.4 3.5 - 5.2 mmol/L   Chloride 106 96 - 106  mmol/L   CO2 24 20 - 29 mmol/L   Calcium 9.5 8.7 - 10.2 mg/dL   Total Protein 7.5 6.0 - 8.5 g/dL   Albumin 3.5 (L) 4.1 - 5.1 g/dL   Globulin, Total 4.0 1.5 - 4.5 g/dL   Bilirubin Total 0.4 0.0 - 1.2 mg/dL   Alkaline Phosphatase 106 44 - 121 IU/L   AST 75 (H) 0 - 40 IU/L   ALT 65 (H) 0 - 44 IU/L  Lipid panel  Result Value Ref Range   Cholesterol, Total 124 100 - 199 mg/dL   Triglycerides 951 0 -  149 mg/dL   HDL 53 >91 mg/dL   VLDL Cholesterol Cal 21 5 - 40 mg/dL   LDL Chol Calc (NIH) 50 0 - 99 mg/dL   Chol/HDL Ratio 2.3 0.0 - 5.0 ratio  Hepatitis B surface antibody,qualitative  Result Value Ref Range   Hep B Surface Ab, Qual Non Reactive   HIV Antibody (routine testing w rflx)  Result Value Ref Range   HIV Screen 4th Generation wRfx Non Reactive Non Reactive  Hepatitis C antibody  Result Value Ref Range   Hep C Virus Ab Non Reactive Non Reactive  Hepatitis B surface antigen  Result Value Ref Range   Hepatitis B Surface Ag Negative Negative  Hepatitis B core antibody, total  Result Value Ref Range   Hep B Core Total Ab Negative Negative   Lab Results  Component Value Date   WBC 7.0 05/03/2023   HGB 13.5 05/03/2023   HCT 39.9 05/03/2023   MCV 101 (H) 05/03/2023   PLT 221 05/03/2023    Lab Results  Component Value Date   CREATININE 0.68 (L) 05/03/2023    Lab Results  Component Value Date   HGBA1C 6.0 (H) 09/03/2020    Lab Results  Component Value Date   AST 75 (H) 05/03/2023   Lab Results  Component Value Date   ALT 65 (H) 05/03/2023   Urinalysis Urinalysis w/Microscopic Order: [id number] Component Ref Range & Units 9 mo ago  Color Colorless, Straw, Light Yellow, Yellow, Dark Yellow Yellow  Clarity Clear Clear  Specific Gravity 1.005 - 1.030 1.024  pH, Urine 5.0 - 8.0 5.5  Protein, Urinalysis Negative mg/dL 2+ Abnormal   Glucose, Urinalysis Negative mg/dL Negative  Ketones, Urinalysis Negative mg/dL 1+ Abnormal   Blood, Urinalysis Negative  Negative  Nitrite, Urinalysis Negative Negative  Leukocyte Esterase, Urinalysis Negative Negative  Bilirubin, Urinalysis Negative Negative  Urobilinogen, Urinalysis 0.2 - 1.0 mg/dL 0.2  WBC, UA <=5 /hpf 1  Red Blood Cells, Urinalysis <=3 /hpf 1  Bacteria, Urinalysis 0 - 5 /hpf 0-5  Squamous Epithelial Cells, Urinalysis /hpf 0  Resulting Agency [facility]   Specimen Collected: 11/05/22 10:08   Performed by: [facility] Last Resulted: 11/05/22 14:55  Received From: [facility]  Result Received: 12/31/22 10:34  I have reviewed the labs.   Pertinent Imaging: No recent imaging    Assessment & Plan:    1. Genital herpes -continue Valtrex 500 mg daily -refill given -Explained that as long as he is not having any severe breakthroughs, he could ask his PCP if they be comfortable refilling his Valtrex going forward  2. BPH with LU TS -PSA obtained by PCP normal   Return if symptoms worsen or fail to improve.  These notes generated with voice recognition software. I apologize for typographical errors.  Michiel Cowboy, PA-C  [facility] [address]  [address] [place], [place] [address] [phone]

## 2023-08-20 ENCOUNTER — Encounter: Payer: Self-pay | Admitting: Urology

## 2023-08-20 ENCOUNTER — Ambulatory Visit (INDEPENDENT_AMBULATORY_CARE_PROVIDER_SITE_OTHER): Payer: 59 | Admitting: Urology

## 2023-08-20 VITALS — BP 143/96 | HR 76 | Ht 71.0 in | Wt 224.0 lb

## 2023-08-20 DIAGNOSIS — A6002 Herpesviral infection of other male genital organs: Secondary | ICD-10-CM

## 2023-08-20 MED ORDER — VALACYCLOVIR HCL 500 MG PO TABS: 500.0000 mg | ORAL_TABLET | Freq: Every day | ORAL | 3 refills | Status: DC

## 2023-10-16 ENCOUNTER — Telehealth: Payer: Self-pay

## 2023-10-16 ENCOUNTER — Other Ambulatory Visit: Payer: Self-pay

## 2023-10-16 DIAGNOSIS — Z79899 Other long term (current) drug therapy: Secondary | ICD-10-CM

## 2023-10-16 DIAGNOSIS — Z7189 Other specified counseling: Secondary | ICD-10-CM

## 2023-10-16 DIAGNOSIS — L732 Hidradenitis suppurativa: Secondary | ICD-10-CM

## 2023-10-16 MED ORDER — COSENTYX SENSOREADY (300 MG) 150 MG/ML ~~LOC~~ SOAJ: 300.0000 mg | SUBCUTANEOUS | 5 refills | Status: DC

## 2023-10-16 MED ORDER — AMOXICILLIN-POT CLAVULANATE 875-125 MG PO TABS: 1.0000 | ORAL_TABLET | Freq: Two times a day (BID) | ORAL | 0 refills | Status: DC

## 2023-10-16 NOTE — Progress Notes (Signed)
 Refill request received from pharmacy

## 2023-10-16 NOTE — Telephone Encounter (Signed)
 Advised patient that refill was sent to pharmacy.

## 2023-10-16 NOTE — Telephone Encounter (Signed)
 I returned patient's call, he is requesting refills of Augmentin to be sent to [facility] on [address]. He states that he is having a severe and painful flare on his abdomen. Please advise.

## 2023-10-28 ENCOUNTER — Telehealth: Payer: Self-pay

## 2023-10-28 NOTE — Telephone Encounter (Signed)
Appointment scheduled. aw

## 2023-10-28 NOTE — Telephone Encounter (Signed)
Patient called office regarding current fare again. He states the amoxicillin alone is not helping. Patient states you prescribed him a second medication before to take alongside the antibiotic that started with a "s" that really improved condition. I looked through his medications and notes and unsure what he could be referring too.

## 2023-10-29 ENCOUNTER — Encounter: Payer: Self-pay | Admitting: Dermatology

## 2023-10-29 ENCOUNTER — Ambulatory Visit (INDEPENDENT_AMBULATORY_CARE_PROVIDER_SITE_OTHER): Payer: Self-pay | Admitting: Dermatology

## 2023-10-29 DIAGNOSIS — L209 Atopic dermatitis, unspecified: Secondary | ICD-10-CM

## 2023-10-29 DIAGNOSIS — Z79899 Other long term (current) drug therapy: Secondary | ICD-10-CM | POA: Diagnosis not present

## 2023-10-29 DIAGNOSIS — Z7189 Other specified counseling: Secondary | ICD-10-CM

## 2023-10-29 DIAGNOSIS — L732 Hidradenitis suppurativa: Secondary | ICD-10-CM | POA: Diagnosis not present

## 2023-10-29 DIAGNOSIS — L300 Nummular dermatitis: Secondary | ICD-10-CM | POA: Diagnosis not present

## 2023-10-29 MED ORDER — SECUKINUMAB 300 MG/2ML ~~LOC~~ SOAJ
300.0000 mg | Freq: Once | SUBCUTANEOUS | Status: AC
  Administered 2023-10-29: 300 mg via SUBCUTANEOUS

## 2023-10-29 MED ORDER — CLOBETASOL PROPIONATE 0.05 % EX CREA: 1.0000 | TOPICAL_CREAM | Freq: Two times a day (BID) | CUTANEOUS | 6 refills | Status: AC

## 2023-10-29 NOTE — Patient Instructions (Signed)

## 2023-10-29 NOTE — Progress Notes (Signed)
Follow-Up Visit   Subjective  Curtis Peterson is a 48 y.o. male who presents for the following: Pt here for HS flare abdomen, groin area. Pt currently on cosentyx monthly injections, taking Augmentin. Also reports eczema spots all over. Currently on dupixent self-injecting every 2 weeks. Pt insurance lapsed and last dose of cosentyx was 2nd week of December. New insurance starts February 1st.  The following portions of the chart were reviewed this encounter and updated as appropriate: medications, allergies, medical history  Review of Systems:  No other skin or systemic complaints except as noted in HPI or Assessment and Plan.  Objective  Well appearing patient in no apparent distress; mood and affect are within normal limits.  A focused examination was performed of the following areas: Abdomen  Relevant exam findings are noted in the Assessment and Plan.    Assessment & Plan   ATOPIC DERMATITIS improved on dupixent but still flaring Exam: hyperpigmented pink circular scaly plaques diffusely on trunk and extremities 20% BSA  Chronic and persistent condition with duration or expected duration over one year. Condition is symptomatic / bothersome to patient. Not to goal.   Atopic dermatitis (eczema) is a chronic, relapsing, pruritic condition that can significantly affect quality of life. It is often associated with allergic rhinitis and/or asthma and can require treatment with topical medications, phototherapy, or in severe cases biologic injectable medication (Dupixent; Adbry) or Oral JAK inhibitors.  Treatment Plan: Continue dupixent 300mg /81mL every 2 weeks and clobetasol cream 0.05% BID prn flares. Avoid applying to face, groin, and axilla. Use as directed. Long-term use can cause thinning of the skin.   Topical steroids (such as triamcinolone, fluocinolone, fluocinonide, mometasone, clobetasol, halobetasol, betamethasone, hydrocortisone) can cause thinning and lightening of the  skin if they are used for too long in the same area. Your physician has selected the right strength medicine for your problem and area affected on the body. Please use your medication only as directed by your physician to prevent side effects.    Dupilumab (Dupixent) is a treatment given by injection for adults and children with moderate-to-severe atopic dermatitis. Goal is control of skin condition, not cure. It is given as 2 injections at the first dose followed by 1 injection ever 2 weeks thereafter.  Young children are dosed monthly.  Potential side effects include allergic reaction, herpes infections, injection site reactions and conjunctivitis (inflammation of the eyes).  The use of Dupixent requires long term medication management, including periodic office visits.   Recommend gentle skin care.   HIDRADENITIS SUPPURATIVA Exam: draining inflamed sinus tracts of infrapannus  Chronic and persistent condition with duration or expected duration over one year. Condition is symptomatic / bothersome to patient. Currently flared.  Hidradenitis Suppurativa is a chronic; persistent; non-curable, but treatable condition due to abnormal inflamed sweat glands in the body folds (axilla, inframammary, groin, medial thighs), causing recurrent painful draining cysts and scarring. It can be associated with severe scarring acne and cysts; also abscesses and scarring of scalp. The goal is control and prevention of flares, as it is not curable. Scars are permanent and can be thickened. Treatment may include daily use of topical medication and oral antibiotics.  Oral isotretinoin may also be helpful.  For some cases, Humira or Cosentyx (biologic injections) may be prescribed to decrease the inflammatory process and prevent flares.  When indicated, inflamed cysts may also be treated surgically.  Treatment Plan: Continue cosentyx 300mg /83mL every 4 weeks. Patient's insurance coverage begins November 09, 2023.  Sample  of cosentyx 300mg /42mL injected into L upper arm. Pt tolerated well.  NDC: 1610-9604-54 LOT: [id number] EXP: 04/06/2025  Reviewed risks of biologics including immunosuppression, infections, injection site reaction, and failure to improve condition. Goal is control of skin condition, not cure.  Some older biologics such as Humira and Enbrel may slightly increase risk of malignancy and may worsen congestive heart failure.  Taltz and Cosentyx may cause inflammatory bowel disease to flare. The use of biologics requires long term medication management, including periodic office visits and monitoring of blood work.   HIDRADENITIS SUPPURATIVA   Related Medications Secukinumab, 300 MG Dose, (COSENTYX SENSOREADY, 300 MG,) 150 MG/ML SOAJ Inject 2 mLs (300 mg total) into the skin every 28 (twenty-eight) days. For maintenance. amoxicillin-clavulanate (AUGMENTIN) 875-125 MG tablet Take 1 tablet by mouth 2 (two) times daily. Secukinumab SOAJ 300 mg  NUMMULAR DERMATITIS   Related Medications clobetasol cream (TEMOVATE) 0.05 % Apply 1 Application topically 2 (two) times daily. Apply to eczema until skin is smooth, then stop. Restart as needed LONG-TERM USE OF HIGH-RISK MEDICATION   Related Medications Secukinumab, 300 MG Dose, (COSENTYX SENSOREADY, 300 MG,) 150 MG/ML SOAJ Inject 2 mLs (300 mg total) into the skin every 28 (twenty-eight) days. For maintenance. amoxicillin-clavulanate (AUGMENTIN) 875-125 MG tablet Take 1 tablet by mouth 2 (two) times daily. COUNSELING AND COORDINATION OF CARE   Related Medications Secukinumab, 300 MG Dose, (COSENTYX SENSOREADY, 300 MG,) 150 MG/ML SOAJ Inject 2 mLs (300 mg total) into the skin every 28 (twenty-eight) days. For maintenance. amoxicillin-clavulanate (AUGMENTIN) 875-125 MG tablet Take 1 tablet by mouth 2 (two) times daily.  Return for As scheduled, w/ Dr. Katrinka Blazing.  Wynonia Lawman, CMA, am acting as scribe for Elie Goody, MD .   Documentation:  I have reviewed the above documentation for accuracy and completeness, and I agree with the above.  Elie Goody, MD

## 2024-02-04 ENCOUNTER — Ambulatory Visit: Payer: 59 | Admitting: Dermatology

## 2024-02-19 ENCOUNTER — Ambulatory Visit: Admitting: Dermatology

## 2024-03-13 ENCOUNTER — Other Ambulatory Visit: Payer: Self-pay | Admitting: Dermatology

## 2024-03-13 DIAGNOSIS — Z79899 Other long term (current) drug therapy: Secondary | ICD-10-CM

## 2024-03-13 DIAGNOSIS — L732 Hidradenitis suppurativa: Secondary | ICD-10-CM

## 2024-03-13 DIAGNOSIS — Z7189 Other specified counseling: Secondary | ICD-10-CM

## 2024-03-17 ENCOUNTER — Ambulatory Visit: Payer: Self-pay | Admitting: Dermatology

## 2024-03-26 ENCOUNTER — Encounter: Payer: Self-pay | Admitting: Dermatology

## 2024-03-26 ENCOUNTER — Ambulatory Visit (INDEPENDENT_AMBULATORY_CARE_PROVIDER_SITE_OTHER): Payer: Self-pay | Admitting: Dermatology

## 2024-03-26 DIAGNOSIS — Z7189 Other specified counseling: Secondary | ICD-10-CM

## 2024-03-26 DIAGNOSIS — L209 Atopic dermatitis, unspecified: Secondary | ICD-10-CM

## 2024-03-26 DIAGNOSIS — L732 Hidradenitis suppurativa: Secondary | ICD-10-CM | POA: Diagnosis not present

## 2024-03-26 DIAGNOSIS — Z79899 Other long term (current) drug therapy: Secondary | ICD-10-CM

## 2024-03-26 MED ORDER — COSENTYX UNOREADY 300 MG/2ML ~~LOC~~ SOAJ: 300.0000 mg | SUBCUTANEOUS | 5 refills | Status: DC

## 2024-03-26 NOTE — Patient Instructions (Addendum)
 Continue Dupixent  300mg /66mL every 2 weeks and clobetasol  cream 0.05% twice daily as needed for flares. Avoid applying to face, groin, and axilla. Use as directed. Long-term use can cause thinning of the skin.    Topical steroids (such as triamcinolone , fluocinolone, fluocinonide, mometasone, clobetasol , halobetasol, betamethasone, hydrocortisone) can cause thinning and lightening of the skin if they are used for too long in the same area. Your physician has selected the right strength medicine for your problem and area affected on the body. Please use your medication only as directed by your physician to prevent side effects.     Dupilumab  (Dupixent ) is a treatment given by injection for adults and children with moderate-to-severe atopic dermatitis. Goal is control of skin condition, not cure. It is given as 2 injections at the first dose followed by 1 injection ever 2 weeks thereafter.  Young children are dosed monthly.   Potential side effects include allergic reaction, herpes infections, injection site reactions and conjunctivitis (inflammation of the eyes).  The use of Dupixent  requires long term medication management, including periodic office visits.    Recommend gentle skin care.   Due to recent changes in healthcare laws, you may see results of your pathology and/or laboratory studies on MyChart before the doctors have had a chance to review them. We understand that in some cases there may be results that are confusing or concerning to you. Please understand that not all results are received at the same time and often the doctors may need to interpret multiple results in order to provide you with the best plan of care or course of treatment. Therefore, we ask that you please give us  2 business days to thoroughly review all your results before contacting the office for clarification. Should we see a critical lab result, you will be contacted sooner.   If You Need Anything After Your Visit  If  you have any questions or concerns for your doctor, please call our main line at [phone] and press option 4 to reach your doctor's medical assistant. If no one answers, please leave a voicemail as directed and we will return your call as soon as possible. Messages left after 4 pm will be answered the following business day.   You may also send us  a message via MyChart. We typically respond to MyChart messages within 1-2 business days.  For prescription refills, please ask your pharmacy to contact our office. Our fax number is [phone].  If you have an urgent issue when the clinic is closed that cannot wait until the next business day, you can page your doctor at the number below.    Please note that while we do our best to be available for urgent issues outside of office hours, we are not available 24/7.   If you have an urgent issue and are unable to reach us , you may choose to seek medical care at your doctor's office, retail clinic, urgent care center, or emergency room.  If you have a medical emergency, please immediately call 911 or go to the emergency department.  Pager Numbers  - Dr. Bary Likes: [phone]  - Dr. Annette Barters: [phone]  - Dr. Felipe Horton: [phone]   In the event of inclement weather, please call our main line at [phone] for an update on the status of any delays or closures.  Dermatology Medication Tips: Please keep the boxes that topical medications come in in order to help keep track of the instructions about where and how to use these. Pharmacies typically print  the medication instructions only on the boxes and not directly on the medication tubes.   If your medication is too expensive, please contact our office at [phone] option 4 or send us  a message through MyChart.   We are unable to tell what your co-pay for medications will be in advance as this is different depending on your insurance coverage. However, we may be able to find a substitute  medication at lower cost or fill out paperwork to get insurance to cover a needed medication.   If a prior authorization is required to get your medication covered by your insurance company, please allow us  1-2 business days to complete this process.  Drug prices often vary depending on where the prescription is filled and some pharmacies may offer cheaper prices.  The website www.goodrx.com contains coupons for medications through different pharmacies. The prices here do not account for what the cost may be with help from insurance (it may be cheaper with your insurance), but the website can give you the price if you did not use any insurance.  - You can print the associated coupon and take it with your prescription to the pharmacy.  - You may also stop by our office during regular business hours and pick up a GoodRx coupon card.  - If you need your prescription sent electronically to a different pharmacy, notify our office through [facility] or by phone at [phone] option 4.     Si Usted Necesita Algo Despus de Su Visita  Tambin puede enviarnos un mensaje a travs de Clinical cytogeneticist. Por lo general respondemos a los mensajes de MyChart en el transcurso de 1 a 2 das hbiles.  Para renovar recetas, por favor pida a su farmacia que se ponga en contacto con nuestra oficina. Franz Jacks de fax es [place] [phone].  Si tiene un asunto urgente cuando la clnica est cerrada y que no puede esperar hasta el siguiente da hbil, puede llamar/localizar a su doctor(a) al nmero que aparece a continuacin.   Por favor, tenga en cuenta que aunque hacemos todo lo posible para estar disponibles para asuntos urgentes fuera del horario de [place], no estamos disponibles las 24 horas del da, los 7 [address]  [address] de la [place].   Si tiene un problema urgente y no puede comunicarse con nosotros, puede optar por buscar atencin mdica  en el consultorio de su doctor(a), en una clnica privada, en un centro de  atencin urgente o en una sala de emergencias.  Si tiene Engineer, drilling, por favor llame inmediatamente al 911 o vaya a la sala de emergencias.  Nmeros de bper  - Dr. Bary Likes: [phone]  - Dra. Annette Barters: [phone]  - Dr. Felipe Horton: [phone]   En caso de inclemencias del tiempo, por favor llame a Lajuan Pila principal al [phone] para una actualizacin sobre el [place] de cualquier retraso o cierre.  Consejos para la medicacin en dermatologa: Por favor, guarde las cajas en las que vienen los medicamentos de uso tpico para ayudarle a seguir las instrucciones sobre dnde y cmo usarlos. Las farmacias generalmente imprimen las instrucciones del medicamento slo en las cajas y no directamente en los tubos del [place].   Si su medicamento es muy caro, por favor, pngase en contacto con Bettyjane Brunet llamando al [phone] y presione la opcin 4 o envenos un mensaje a travs de Clinical cytogeneticist.   No podemos decirle cul ser su copago por los medicamentos por adelantado ya que esto es diferente dependiendo de la [place]  seguro. Sin embargo, es posible que podamos encontrar un medicamento sustituto a Audiological scientist un formulario para que el seguro cubra el medicamento que se considera necesario.   Si se requiere una autorizacin previa para que su compaa de seguros [place] su medicamento, por favor permtanos de 1 a 2 das hbiles para completar este proceso.  Los precios de los medicamentos varan con frecuencia dependiendo del Environmental consultant de dnde se surte la receta y alguna farmacias pueden ofrecer precios ms baratos.  El sitio web www.goodrx.com tiene cupones para medicamentos de Health and safety inspector. Los precios aqu no tienen en cuenta lo que podra costar con la ayuda del seguro (puede ser ms barato con su seguro), pero el sitio web puede darle el precio si no utiliz Tourist information centre manager.  - Puede imprimir el cupn correspondiente y llevarlo con su receta a la  farmacia.  - Tambin puede pasar por nuestra oficina durante el horario de atencin regular y Education officer, museum una tarjeta de cupones de GoodRx.  - Si necesita que su receta se enve electrnicamente a una farmacia diferente, informe a nuestra oficina a travs de MyChart de [place] o por telfono llamando al [phone] y presione la opcin 4.

## 2024-03-26 NOTE — Progress Notes (Unsigned)
 Follow-Up Visit   Subjective  Curtis Peterson is a 48 y.o. male who presents for the following: nummular derm and HS 6 month follow up. Patient currently on Dupixent  for dermatitis, last shot was Saturday, much improved. Uses clobetasol  cream prn. On Cosentyx  for HS, last shot was 6/1, still having issues at low abdomen, left buttock, inner thighs. Very painful for patient.  The patient has spots, moles and lesions to be evaluated, some may be new or changing and the patient may have concern these could be cancer.   The following portions of the chart were reviewed this encounter and updated as appropriate: medications, allergies, medical history  Review of Systems:  No other skin or systemic complaints except as noted in HPI or Assessment and Plan.  Objective  Well appearing patient in no apparent distress; mood and affect are within normal limits.   A focused examination was performed of the following areas: Legs, arms,   Relevant exam findings are noted in the Assessment and Plan.    Assessment & Plan   ATOPIC DERMATITIS improved on dupixent  but still flaring Exam:  clearance of lesions with residual postinflammatory pigmentary changes on legs. 20% BSA improved to 0% Chronic and persistent condition with duration or expected duration over one year. Condition is symptomatic / bothersome to patient. Not to goal.    Atopic dermatitis (eczema) is a chronic, relapsing, pruritic condition that can significantly affect quality of life. It is often associated with allergic rhinitis and/or asthma and can require treatment with topical medications, phototherapy, or in severe cases biologic injectable medication (Dupixent ; Adbry) or Oral JAK inhibitors.   Treatment Plan: Continue Dupixent  300mg /69mL every 2 weeks and clobetasol  cream 0.05% BID prn flares. Avoid applying to face, groin, and axilla. Use as directed. Long-term use can cause thinning of the skin.    Topical steroids (such as  triamcinolone , fluocinolone, fluocinonide, mometasone, clobetasol , halobetasol, betamethasone, hydrocortisone) can cause thinning and lightening of the skin if they are used for too long in the same area. Your physician has selected the right strength medicine for your problem and area affected on the body. Please use your medication only as directed by your physician to prevent side effects.     Dupilumab  (Dupixent ) is a treatment given by injection for adults and children with moderate-to-severe atopic dermatitis. Goal is control of skin condition, not cure. It is given as 2 injections at the first dose followed by 1 injection ever 2 weeks thereafter.  Young children are dosed monthly.   Potential side effects include allergic reaction, herpes infections, injection site reactions and conjunctivitis (inflammation of the eyes).  The use of Dupixent  requires long term medication management, including periodic office visits.    Recommend gentle skin care.   HIDRADENITIS SUPPURATIVA Exam: persistent indurated plaques of sinus tracts with copious purulent and bloody drainage on lower abdomen and mons pubis  Chronic and persistent condition with duration or expected duration over one year. Condition is bothersome/symptomatic for patient. Currently flared.   Hidradenitis Suppurativa is a chronic; persistent; non-curable, but treatable condition due to abnormal inflamed sweat glands in the body folds (axilla, inframammary, groin, medial thighs), causing recurrent painful draining cysts and scarring. It can be associated with severe scarring acne and cysts; also abscesses and scarring of scalp. The goal is control and prevention of flares, as it is not curable. Scars are permanent and can be thickened. Treatment may include daily use of topical medication and oral antibiotics.  Oral isotretinoin  may also  be helpful.  For some cases, Humira or Cosentyx  (biologic injections) may be prescribed to decrease the  inflammatory process and prevent flares.  When indicated, inflamed cysts may also be treated surgically.  Treatment Plan: Not responding to current dose of cosentyx  Continue Cosentyx  300 mg/2 mL UnoReady increasing from every 4 weeks to every 2 weeks.  Consider switching to Bimzelx vs Rinvoq.   HIDRADENITIS SUPPURATIVA   Related Medications Secukinumab , 300 MG Dose, (COSENTYX  SENSOREADY, 300 MG,) 150 MG/ML SOAJ Inject 2 mLs (300 mg total) into the skin every 28 (twenty-eight) days. For maintenance. amoxicillin -clavulanate (AUGMENTIN ) 875-125 MG tablet Take 1 tablet by mouth 2 (two) times daily. Secukinumab  (COSENTYX  UNOREADY) 300 MG/2ML SOAJ Inject 300 mg into the skin every 14 (fourteen) days. ATOPIC DERMATITIS, UNSPECIFIED TYPE   Related Medications triamcinolone  ointment (KENALOG ) 0.1 % APPLY TO AFFECTED AREA TWICE A DAY X2WKS, THEN DECREASE TO 5 DAYS A WK THEREAFTER, AVOID THE FACE, GROIN &UNDERARMS LONG-TERM USE OF HIGH-RISK MEDICATION   Related Medications Secukinumab , 300 MG Dose, (COSENTYX  SENSOREADY, 300 MG,) 150 MG/ML SOAJ Inject 2 mLs (300 mg total) into the skin every 28 (twenty-eight) days. For maintenance. amoxicillin -clavulanate (AUGMENTIN ) 875-125 MG tablet Take 1 tablet by mouth 2 (two) times daily. COUNSELING AND COORDINATION OF CARE   Related Medications Secukinumab , 300 MG Dose, (COSENTYX  SENSOREADY, 300 MG,) 150 MG/ML SOAJ Inject 2 mLs (300 mg total) into the skin every 28 (twenty-eight) days. For maintenance. amoxicillin -clavulanate (AUGMENTIN ) 875-125 MG tablet Take 1 tablet by mouth 2 (two) times daily. MEDICATION MANAGEMENT    Return in about 3 months (around 06/26/2024) for Hidradenitis, Atopic Dermatitis.  Kerstin Peeling, RMA, am acting as scribe for Harris Liming, MD .   Documentation: I have reviewed the above documentation for accuracy and completeness, and I agree with the above.  Harris Liming, MD

## 2024-04-02 ENCOUNTER — Other Ambulatory Visit: Payer: Self-pay

## 2024-04-02 MED ORDER — DUPIXENT 300 MG/2ML ~~LOC~~ SOAJ: 300.0000 mg | SUBCUTANEOUS | 5 refills | Status: DC

## 2024-04-02 NOTE — Progress Notes (Signed)
 Escripted refills sent to [facility].

## 2024-04-16 ENCOUNTER — Other Ambulatory Visit: Payer: Self-pay

## 2024-04-16 ENCOUNTER — Telehealth: Payer: Self-pay

## 2024-04-16 DIAGNOSIS — Z7189 Other specified counseling: Secondary | ICD-10-CM

## 2024-04-16 DIAGNOSIS — L732 Hidradenitis suppurativa: Secondary | ICD-10-CM

## 2024-04-16 DIAGNOSIS — Z79899 Other long term (current) drug therapy: Secondary | ICD-10-CM

## 2024-04-16 MED ORDER — AMOXICILLIN-POT CLAVULANATE 875-125 MG PO TABS: 1.0000 | ORAL_TABLET | Freq: Two times a day (BID) | ORAL | 0 refills | Status: AC

## 2024-04-16 NOTE — Progress Notes (Signed)
 RX sent in and left message to return our call. aw

## 2024-04-16 NOTE — Telephone Encounter (Signed)
RX sent in and patient advised. aw 

## 2024-04-16 NOTE — Telephone Encounter (Signed)
 Patient called asking can he have RF of Amoxicillin  as it really helps and improves his HS? Patient states it was discussed at last office visit. aw

## 2024-05-28 ENCOUNTER — Other Ambulatory Visit: Payer: Self-pay

## 2024-05-28 DIAGNOSIS — Z7189 Other specified counseling: Secondary | ICD-10-CM

## 2024-05-28 DIAGNOSIS — L732 Hidradenitis suppurativa: Secondary | ICD-10-CM

## 2024-05-28 DIAGNOSIS — Z79899 Other long term (current) drug therapy: Secondary | ICD-10-CM

## 2024-05-28 MED ORDER — COSENTYX UNOREADY 300 MG/2ML ~~LOC~~ SOAJ: 300.0000 mg | SUBCUTANEOUS | 5 refills | Status: DC

## 2024-06-27 ENCOUNTER — Emergency Department

## 2024-06-27 ENCOUNTER — Emergency Department
Admission: EM | Admit: 2024-06-27 | Discharge: 2024-06-27 | Disposition: A | Discharge status: Home / Self Care | Attending: Emergency Medicine | Admitting: Emergency Medicine

## 2024-06-27 ENCOUNTER — Encounter: Payer: Self-pay | Admitting: *Deleted

## 2024-06-27 ENCOUNTER — Other Ambulatory Visit: Payer: Self-pay

## 2024-06-27 DIAGNOSIS — L732 Hidradenitis suppurativa: Secondary | ICD-10-CM | POA: Diagnosis present

## 2024-06-27 DIAGNOSIS — I10 Essential (primary) hypertension: Secondary | ICD-10-CM | POA: Insufficient documentation

## 2024-06-27 DIAGNOSIS — E119 Type 2 diabetes mellitus without complications: Secondary | ICD-10-CM | POA: Insufficient documentation

## 2024-06-27 LAB — CBC
HCT: 48.7 % (ref 39.0–52.0)
Hemoglobin: 16.4 g/dL (ref 13.0–17.0)
MCH: 34.5 pg — ABNORMAL HIGH (ref 26.0–34.0)
MCHC: 33.7 g/dL (ref 30.0–36.0)
MCV: 102.3 fL — ABNORMAL HIGH (ref 80.0–100.0)
Platelets: 188 K/uL (ref 150–400)
RBC: 4.76 MIL/uL (ref 4.22–5.81)
RDW: 13.4 % (ref 11.5–15.5)
WBC: 6 K/uL (ref 4.0–10.5)
nRBC: 0 % (ref 0.0–0.2)

## 2024-06-27 LAB — BASIC METABOLIC PANEL WITH GFR
Anion gap: 11 (ref 5–15)
BUN: 9 mg/dL (ref 6–20)
CO2: 22 mmol/L (ref 22–32)
Calcium: 10 mg/dL (ref 8.9–10.3)
Chloride: 106 mmol/L (ref 98–111)
Creatinine, Ser: 0.55 mg/dL — ABNORMAL LOW (ref 0.61–1.24)
GFR, Estimated: >60 mL/min (ref >60)
Glucose, Bld: 191 mg/dL — ABNORMAL HIGH (ref 70–99)
Potassium: 4.2 mmol/L (ref 3.5–5.1)
Sodium: 139 mmol/L (ref 135–145)

## 2024-06-27 LAB — LACTIC ACID, PLASMA: Lactic Acid, Venous: 0.8 mmol/L (ref 0.5–1.9)

## 2024-06-27 MED ORDER — MORPHINE SULFATE (PF) 4 MG/ML IV SOLN
4.0000 mg | Freq: Once | INTRAVENOUS | Status: AC
  Administered 2024-06-27: 4 mg via INTRAVENOUS
  Filled 2024-06-27: qty 1

## 2024-06-27 MED ORDER — HYDROCODONE-ACETAMINOPHEN 5-325 MG PO TABS: 1.0000 | ORAL_TABLET | Freq: Three times a day (TID) | ORAL | 0 refills | Status: AC | PRN

## 2024-06-27 MED ORDER — ONDANSETRON HCL 4 MG/2ML IJ SOLN
4.0000 mg | Freq: Once | INTRAMUSCULAR | Status: AC
  Administered 2024-06-27: 4 mg via INTRAVENOUS
  Filled 2024-06-27: qty 2

## 2024-06-27 MED ORDER — IOHEXOL 300 MG/ML  SOLN
100.0000 mL | Freq: Once | INTRAMUSCULAR | Status: AC | PRN
  Administered 2024-06-27: 100 mL via INTRAVENOUS

## 2024-06-27 MED ORDER — DOXYCYCLINE HYCLATE 100 MG PO TABS
100.0000 mg | ORAL_TABLET | Freq: Once | ORAL | Status: AC
Start: 2024-06-27 — End: 2024-06-27
  Administered 2024-06-27: 100 mg via ORAL
  Filled 2024-06-27: qty 1

## 2024-06-27 MED ORDER — ONDANSETRON 4 MG PO TBDP: 4.0000 mg | ORAL_TABLET | Freq: Three times a day (TID) | ORAL | 0 refills | Status: AC | PRN

## 2024-06-27 MED ORDER — DOXYCYCLINE HYCLATE 100 MG PO TABS: 100.0000 mg | ORAL_TABLET | Freq: Two times a day (BID) | ORAL | 0 refills | Status: DC

## 2024-06-27 NOTE — Discharge Instructions (Addendum)
 Your exam, labs, and CT scan are overall reassuring. No signs of sepsis or expanding infections. You have cellulitis due to your HS. Take the prescription antibiotic as directed and the pain medicine as needed. Follow-up with your dermatology provider as scheduled. Call general surgery as needed.

## 2024-06-27 NOTE — ED Provider Notes (Signed)
 [facility] Emergency Department Provider Note     Event Date/Time   First MD Initiated Contact with Patient 06/27/24 1502     (approximate)   History   Hidradenitis Suppurativa (HS)   HPI  Curtis Peterson is a 48 y.o. male with a history of HTN, DM type II, eczema, and hidradenitis suppurativa presents to the ED endorsing skin changes.  Patient describes some discomfort and discoloration to the skin of under his pannus.  Patient would describe worsening symptoms over the last month.  He notes in the past he has had surgical intervention including skin excisions and I&D's.  He apparently had abdominal/pelvic wall excisional surgeries in the past.  He denies any fevers, chills, or sweats.  Chart review reveals the patient is under the care of dermatology for his chronic persistent conditions and is also receiving immunosuppressive therapy for both conditions including Dupixent  and Cosentyx , respectively.   Physical Exam   Triage Vital Signs: ED Triage Vitals [06/27/24 1406]  Encounter Vitals Group     BP (!) 145/98     Girls Systolic BP Percentile      Girls Diastolic BP Percentile      Boys Systolic BP Percentile      Boys Diastolic BP Percentile      Pulse Rate 82     Resp 16     Temp 98.3 F (36.8 C)     Temp Source Oral     SpO2 96 %     Weight      Height      Head Circumference      Peak Flow      Pain Score 6     Pain Loc      Pain Education      Exclude from Growth Chart     Most recent vital signs: Vitals:   06/27/24 1406 06/27/24 1531  BP: (!) 145/98   Pulse: 82   Resp: 16   Temp: 98.3 F (36.8 C)   SpO2: 96% 96%    General Awake, no distress. NAD HEENT NCAT. PERRL. EOMI. No rhinorrhea. Mucous membranes are moist.  CV:  Good peripheral perfusion.  RESP:  Normal effort.  ABD:  No distention.  SKIN:  Evidence of hypertrophic and hyperpigmented skin to the lower abdominal pelvic crease.  Some spontaneous purulent drainage  from 2-3 different areas.  Some chronic induration appreciated.  No fluctuance noted.   ED Results / Procedures / Treatments   Labs (all labs ordered are listed, but only abnormal results are displayed) Labs Reviewed  CBC - Abnormal; Notable for the following components:      Result Value   MCV 102.3 (*)    MCH 34.5 (*)    All other components within normal limits  BASIC METABOLIC PANEL WITH GFR - Abnormal; Notable for the following components:   Glucose, Bld 191 (*)    Creatinine, Ser 0.55 (*)    All other components within normal limits  LACTIC ACID, PLASMA    EKG   RADIOLOGY  I personally viewed and evaluated these images as part of my medical decision making, as well as reviewing the written report by the radiologist.  ED Provider Interpretation: Abdominal pelvic wall cellulitis with reactive lymphadenopathy  CT ABDOMEN PELVIS W CONTRAST Result Date: 06/27/2024 CLINICAL DATA:  Left lower quadrant/suprapubic pain and swelling. History of hidradenitis suppurativa. EXAM: CT ABDOMEN AND PELVIS WITH CONTRAST TECHNIQUE: Multidetector CT imaging of the abdomen and pelvis was performed  using the standard protocol following bolus administration of intravenous contrast. RADIATION DOSE REDUCTION: This exam was performed according to the departmental dose-optimization program which includes automated exposure control, adjustment of the mA and/or kV according to patient size and/or use of iterative reconstruction technique. CONTRAST:  OMNIPAQUE  IOHEXOL  300 MG/ML  SOLN COMPARISON:  CT scan 09/02/2020 FINDINGS: Lower chest: The lung bases are clear of acute process. No pleural effusion or pulmonary lesions. The heart is normal in size. No pericardial effusion. The distal esophagus and aorta are unremarkable. Hepatobiliary: No focal hepatic lesions or intrahepatic biliary dilatation. The gallbladder is normal. No common bile duct dilatation. Pancreas: Unremarkable. No pancreatic ductal  dilatation or surrounding inflammatory changes. Spleen: Normal in size without focal abnormality. Adrenals/Urinary Tract: Adrenal glands and kidneys normal. No renal, ureteral or bladder calculi or mass. Stomach/Bowel: The stomach, duodenum, small bowel and colon are grossly normal without oral contrast. No inflammatory changes, mass lesions or obstructive findings. The appendix is normal. Vascular/Lymphatic: The aorta is normal in caliber. No dissection. The branch vessels are patent. The major venous structures are patent. No mesenteric or retroperitoneal mass or adenopathy. Small scattered lymph nodes are noted. Reproductive: The prostate gland and seminal vesicles are. Other: Areas of thick irregular enhancement noted involving the lower abdominal/pelvic wall and suprapubic region having a somewhat similar appearance to prior scan from 2021 and most likely recurrent infection (hidradenitis suppurativa). Associated adjacent enlarged inguinal. No intrapelvic fluid collections abscess. Musculoskeletal: No significant bony. IMPRESSION: 1. Areas of thick irregular subcutaneous enhancement involving the lower abdominal/pelvic wall and suprapubic region having a somewhat similar appearance to prior scan from 2021 and most likely recurrent infection (hidradenitis suppurativa). Associated adjacent enlarged inguinal lymph nodes. 2. No intrapelvic fluid collections or abscess. 3. No acute abdominal/pelvic findings, mass lesions or adenopathy. Electronically Signed   By: MYRTIS Stammer M.D.   On: 06/27/2024 16:25    PROCEDURES:  Critical Care performed: No  Procedures   MEDICATIONS ORDERED IN ED: Medications  ondansetron  (ZOFRAN ) injection 4 mg (4 mg Intravenous Given 06/27/24 1540)  morphine  (PF) 4 MG/ML injection 4 mg (4 mg Intravenous Given 06/27/24 1540)  iohexol  (OMNIPAQUE ) 300 MG/ML solution 100 mL (100 mLs Intravenous Contrast Given 06/27/24 1550)  doxycycline  (VIBRA -TABS) tablet 100 mg (100 mg Oral Given  06/27/24 1723)     IMPRESSION / MDM / ASSESSMENT AND PLAN / ED COURSE  I reviewed the triage vital signs and the nursing notes.                              Differential diagnosis includes, but is not limited to, abscess, cellulitis, contact dermatitis, intertriginous candidiasis  Patient's presentation is most consistent with acute complicated illness / injury requiring diagnostic workup.  Patient's diagnosis is consistent with HS cellulitis to the abdominopelvic wall. No evidence of focal drainable abscess.  Patient's labs overall reassuring.  No evidence of acute leukocytosis or critical anemia.  CT images reviewed by me, revealed the focal cellulitis without drainable abscess.  Patient will be discharged home with prescriptions for doxycycline . Patient is to follow up with dermatology and/or general surgery as discussed, as needed or otherwise directed. Patient is given ED precautions to return to the ED for any worsening or new symptoms.   FINAL CLINICAL IMPRESSION(S) / ED DIAGNOSES   Final diagnoses:  Hidradenitis suppurativa     Rx / DC Orders   ED Discharge Orders  Ordered    doxycycline  (VIBRA -TABS) 100 MG tablet  2 times daily        06/27/24 1713    HYDROcodone -acetaminophen  (NORCO/VICODIN) 5-325 MG tablet  3 times daily PRN        06/27/24 1713    ondansetron  (ZOFRAN -ODT) 4 MG disintegrating tablet  Every 8 hours PRN        06/27/24 1713             Note:  This document was prepared using Dragon voice recognition software and may include unintentional dictation errors.    Loyd Candida LULLA Aldona, PA-C 06/27/24 2344    Bradler, Evan K, MD 06/28/24 [phone]

## 2024-06-27 NOTE — ED Triage Notes (Signed)
 Pt with hx of Hidradenitis Suppurativa (HS) is here due to pain and discoloration of skin in fold under his stomach which has been getting worse for the past month.  Pt states that he has had to have surgical intervention in the past when his skin looked like this in the past.

## 2024-07-02 ENCOUNTER — Encounter: Payer: Self-pay | Admitting: Dermatology

## 2024-07-02 ENCOUNTER — Ambulatory Visit: Admitting: Dermatology

## 2024-07-02 DIAGNOSIS — Z79899 Other long term (current) drug therapy: Secondary | ICD-10-CM

## 2024-07-02 DIAGNOSIS — L209 Atopic dermatitis, unspecified: Secondary | ICD-10-CM | POA: Diagnosis not present

## 2024-07-02 DIAGNOSIS — L732 Hidradenitis suppurativa: Secondary | ICD-10-CM | POA: Diagnosis not present

## 2024-07-02 DIAGNOSIS — Z7189 Other specified counseling: Secondary | ICD-10-CM

## 2024-07-02 MED ORDER — DOXYCYCLINE MONOHYDRATE 100 MG PO CAPS: 100.0000 mg | ORAL_CAPSULE | Freq: Two times a day (BID) | ORAL | 11 refills | Status: AC

## 2024-07-02 NOTE — Progress Notes (Signed)
 Follow-Up Visit   Subjective  Curtis Peterson is a 48 y.o. male who presents for the following: nummular derm and HS 6 month follow up.   Patient currently on Dupixent  for dermatitis,  Uses clobetasol  cream prn. Dupixent  injections every 2 weeks. He reports no problems with eczema.   On Cosentyx  for HS. Injections are once a month.  Next injection will be first of October. Was seen at ED 06/27/2024 due to flare of HS at groin. He states it was different colors. They prescribed Doxycyline and he states he is doing better. He states Doxycycline  always helps this condition.     The following portions of the chart were reviewed this encounter and updated as appropriate: medications, allergies, medical history  Review of Systems:  No other skin or systemic complaints except as noted in HPI or Assessment and Plan.  Objective  Well appearing patient in no apparent distress; mood and affect are within normal limits.   A focused examination was performed of the following areas: Legs, arms,   Relevant exam findings are noted in the Assessment and Plan.    Assessment & Plan   ATOPIC DERMATITIS improved on dupixent  but still flaring Exam:  clearance of lesions with residual postinflammatory pigmentary changes on legs. 20% BSA improved to 0% Chronic and persistent condition with duration or expected duration over one year. Condition is symptomatic / bothersome to patient. Not to goal.    Atopic dermatitis (eczema) is a chronic, relapsing, pruritic condition that can significantly affect quality of life. It is often associated with allergic rhinitis and/or asthma and can require treatment with topical medications, phototherapy, or in severe cases biologic injectable medication (Dupixent ; Adbry) or Oral JAK inhibitors.   Treatment Plan: Continue Dupixent  300mg /3mL every 2 weeks and clobetasol  cream 0.05% BID prn flares. Avoid applying to face, groin, and axilla. Use as directed. Long-term use  can cause thinning of the skin.    Topical steroids (such as triamcinolone , fluocinolone, fluocinonide, mometasone, clobetasol , halobetasol, betamethasone, hydrocortisone) can cause thinning and lightening of the skin if they are used for too long in the same area. Your physician has selected the right strength medicine for your problem and area affected on the body. Please use your medication only as directed by your physician to prevent side effects.     Dupilumab  (Dupixent ) is a treatment given by injection for adults and children with moderate-to-severe atopic dermatitis. Goal is control of skin condition, not cure. It is given as 2 injections at the first dose followed by 1 injection ever 2 weeks thereafter.  Young children are dosed monthly.   Potential side effects include allergic reaction, herpes infections, injection site reactions and conjunctivitis (inflammation of the eyes).  The use of Dupixent  requires long term medication management, including periodic office visits.    Recommend gentle skin care.   HIDRADENITIS SUPPURATIVA, recent flare responding to doxycycline  Exam: persistent indurated plaques of sinus tracts with mild purulent and bloody drainage on lower abdomen and mons pubis  Chronic and persistent condition with duration or expected duration over one year. Condition is bothersome/symptomatic for patient. Currently flared.   Hidradenitis Suppurativa is a chronic; persistent; non-curable, but treatable condition due to abnormal inflamed sweat glands in the body folds (axilla, inframammary, groin, medial thighs), causing recurrent painful draining cysts and scarring. It can be associated with severe scarring acne and cysts; also abscesses and scarring of scalp. The goal is control and prevention of flares, as it is not curable. Scars are permanent  and can be thickened. Treatment may include daily use of topical medication and oral antibiotics.  Oral isotretinoin  may also be  helpful.  For some cases, Humira or Cosentyx  (biologic injections) may be prescribed to decrease the inflammatory process and prevent flares.  When indicated, inflamed cysts may also be treated surgically.  Treatment Plan: Not responding to current dose of cosentyx  Increase Cosentyx  300 mg/2 mL UnoReady to every 2 weeks.  Start Doxy 100  mg BID prn flares x 30 days Samples x2 given: Lot: [id number], Exp: 10/07/2025  checked with Emmaline, patient has started receiving 1 injection every 2 weeks. Sent MyChart message with update     Return in about 3 months (around 10/01/2024) for HS Recheck, Atopic Dermatitis Follow Up.  Curtis Peterson, RMA, am acting as scribe for Boneta Sharps, MD .   Documentation: I have reviewed the above documentation for accuracy and completeness, and I agree with the above.  Boneta Sharps, MD

## 2024-07-02 NOTE — Patient Instructions (Addendum)
 Increase Cosentyx  to every 2 weeks.   Continue Dupixent  every 2 weeks.     Reviewed risks of biologics including immunosuppression, infections (i.e. TB reactivation), injection site reaction, and failure to improve condition. Goal is control of skin condition, not cure.  Some older biologics such as Humira and Enbrel may slightly increase risk of malignancy and may worsen congestive heart failure.  Taltz, Cosentyx , and Bimzelx may cause inflammatory bowel disease to flare or may increase incidence of yeast infections. Skyrizi, Tremfya, and Stelara may also slightly increase risk of infection. The use of biologics requires long term medication management, including periodic office visits, annual TB screening test and monitoring of blood work.   Dupilumab  (Dupixent ) is a biologic treatment given by injection for adults and children with moderate-to-severe atopic dermatitis. Goal is control of skin condition, not cure. It is given as 2 injections at the first dose followed by 1 injection every 2 weeks thereafter.  Young children are dosed monthly.  Potential side effects include allergic reaction, herpes infections, injection site reactions and conjunctivitis (inflammation of the eyes).  The use of Dupixent  requires long term medication management, including periodic office visits.   Due to recent changes in healthcare laws, you may see results of your pathology and/or laboratory studies on MyChart before the doctors have had a chance to review them. We understand that in some cases there may be results that are confusing or concerning to you. Please understand that not all results are received at the same time and often the doctors may need to interpret multiple results in order to provide you with the best plan of care or course of treatment. Therefore, we ask that you please give us  2 business days to thoroughly review all your results before contacting the office for clarification. Should we see a  critical lab result, you will be contacted sooner.   If You Need Anything After Your Visit  If you have any questions or concerns for your doctor, please call our main line at [phone] and press option 4 to reach your doctor's medical assistant. If no one answers, please leave a voicemail as directed and we will return your call as soon as possible. Messages left after 4 pm will be answered the following business day.   You may also send us  a message via MyChart. We typically respond to MyChart messages within 1-2 business days.  For prescription refills, please ask your pharmacy to contact our office. Our fax number is [phone].  If you have an urgent issue when the clinic is closed that cannot wait until the next business day, you can page your doctor at the number below.    Please note that while we do our best to be available for urgent issues outside of office hours, we are not available 24/7.   If you have an urgent issue and are unable to reach us , you may choose to seek medical care at your doctor's office, retail clinic, urgent care center, or emergency room.  If you have a medical emergency, please immediately call 911 or go to the emergency department.  Pager Numbers  - Dr. Hester: [phone]  - Dr. Jackquline: [phone]  - Dr. Claudene: [phone]   - Dr. Raymund: [phone]  In the event of inclement weather, please call our main line at [phone] for an update on the status of any delays or closures.  Dermatology Medication Tips: Please keep the boxes that topical medications come in in order to help keep track  of the instructions about where and how to use these. Pharmacies typically print the medication instructions only on the boxes and not directly on the medication tubes.   If your medication is too expensive, please contact our office at [phone] option 4 or send us  a message through MyChart.   We are unable to tell what your co-pay for  medications will be in advance as this is different depending on your insurance coverage. However, we may be able to find a substitute medication at lower cost or fill out paperwork to get insurance to cover a needed medication.   If a prior authorization is required to get your medication covered by your insurance company, please allow us  1-2 business days to complete this process.  Drug prices often vary depending on where the prescription is filled and some pharmacies may offer cheaper prices.  The website www.goodrx.com contains coupons for medications through different pharmacies. The prices here do not account for what the cost may be with help from insurance (it may be cheaper with your insurance), but the website can give you the price if you did not use any insurance.  - You can print the associated coupon and take it with your prescription to the pharmacy.  - You may also stop by our office during regular business hours and pick up a GoodRx coupon card.  - If you need your prescription sent electronically to a different pharmacy, notify our office through [facility] or by phone at [phone] option 4.     Si Usted Necesita Algo Despus de Su Visita  Tambin puede enviarnos un mensaje a travs de Clinical cytogeneticist. Por lo general respondemos a los mensajes de MyChart en el transcurso de 1 a 2 das hbiles.  Para renovar recetas, por favor pida a su farmacia que se ponga en contacto con nuestra oficina. Randi lakes de fax es [place] [phone].  Si tiene un asunto urgente cuando la clnica est cerrada y que no puede esperar hasta el siguiente da hbil, puede llamar/localizar a su doctor(a) al nmero que aparece a continuacin.   Por favor, tenga en cuenta que aunque hacemos todo lo posible para estar disponibles para asuntos urgentes fuera del horario de [place], no estamos disponibles las 24 horas del da, los 7 [address]  [address] de la [employer].   Si tiene un problema urgente y no puede  comunicarse con nosotros, puede optar por buscar atencin mdica  en el consultorio de su doctor(a), en una clnica privada, en un centro de atencin urgente o en una sala de emergencias.  Si tiene Engineer, drilling, por favor llame inmediatamente al 911 o vaya a la sala de emergencias.  Nmeros de bper  - Dr. Hester: [phone]  - Dra. Jackquline: [phone]  - Dr. Claudene: [phone]  - Dra. Kitts: [phone]  En caso de inclemencias del [place], por favor llame a nuestra lnea principal al [phone] para una actualizacin sobre el estado de cualquier retraso o cierre.  Consejos para la medicacin en dermatologa: Por favor, guarde las cajas en las que vienen los medicamentos de uso tpico para ayudarle a seguir las instrucciones sobre dnde y cmo usarlos. Las farmacias generalmente imprimen las instrucciones del medicamento slo en las cajas y no directamente en los tubos del [place].   Si su medicamento es muy caro, por favor, pngase en contacto con landry rieger llamando al [phone] y presione la opcin 4 o envenos un mensaje a travs de Clinical cytogeneticist.   No podemos decirle cul ser su  copago por los medicamentos por adelantado ya que esto es diferente dependiendo de la cobertura de su seguro. Sin embargo, es posible que podamos encontrar un medicamento sustituto a Audiological scientist un formulario para que el seguro cubra el medicamento que se considera necesario.   Si se requiere una autorizacin previa para que su compaa de seguros malta su medicamento, por favor permtanos de 1 a 2 das hbiles para completar este proceso.  Los precios de los medicamentos varan con frecuencia dependiendo del Environmental consultant de dnde se surte la receta y alguna farmacias pueden ofrecer precios ms baratos.  El sitio web www.goodrx.com tiene cupones para medicamentos de Health and safety inspector. Los precios aqu no tienen en cuenta lo que podra costar con la ayuda del seguro (puede ser ms  barato con su seguro), pero el sitio web puede darle el precio si no utiliz Tourist information centre manager.  - Puede imprimir el cupn correspondiente y llevarlo con su receta a la farmacia.  - Tambin puede pasar por nuestra oficina durante el horario de atencin regular y Education officer, museum una tarjeta de cupones de GoodRx.  - Si necesita que su receta se enve electrnicamente a una farmacia diferente, informe a nuestra oficina a travs de MyChart de [place] o por telfono llamando al [phone] y presione la opcin 4.

## 2024-07-06 ENCOUNTER — Encounter: Payer: Self-pay | Admitting: Dermatology

## 2024-09-02 ENCOUNTER — Telehealth: Payer: Self-pay

## 2024-09-02 NOTE — Telephone Encounter (Signed)
 Patient called and left a VM on the nurse line. Patient states he is having a eczema flare and would like to know can he inject two Dupixent  injections this week?

## 2024-09-02 NOTE — Telephone Encounter (Signed)
 Spoke with patient again this afternoon. He states he has gotten his flare to calm down with his Clobetasol . He does not need anything from our office at this time.

## 2024-09-03 ENCOUNTER — Encounter: Payer: Self-pay | Admitting: Dermatology

## 2024-09-10 ENCOUNTER — Other Ambulatory Visit: Payer: Self-pay | Admitting: Urology

## 2024-09-10 DIAGNOSIS — A6002 Herpesviral infection of other male genital organs: Secondary | ICD-10-CM

## 2024-09-10 NOTE — Progress Notes (Deleted)
 09/11/2024 10:35 PM   Curtis Peterson Aug 09, 1976 [id number]  Referring provider: Sadie Manna, MD [address] [facility] [place],  [place] [address]  Urological history 1. 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 BPH  -PSA (10/2022) 1.20   No chief complaint on file.    HPI: Curtis Peterson is a 48 y.o. male who presents today for medication refills.   Previous records reviewed.  I PSS ***  He reports sensation of incomplete bladder emptying,   urinary frequency,   urinary intermittency,   urinary urgency,   a weak urinary stream,   having to strain to void,   nocturia x ***,   leaking before being able to reach the restroom,   leaking with coughing,   leaking without awareness,   and post void dribbling.     He is wearing *** pads//depends  daily.    Patient denies any modifying or aggravating factors.  Patient denies any recent UTI's, gross hematuria, dysuria or suprapubic/flank pain.  Patient denies any fevers, chills, nausea or vomiting.  ***  He has a family history of PCa, colon cancer, ovarian cancer and/or breast cancer with ***.   He does not have a family history of PCa, colon cancer, ovarian cancer, and/or breast cancer .***     UA***  PVR***  PSA  (10/2022) 1.20  Serum creatinine (06/2024) 0.55  SHIM ***  He does not have confidence that he could get and keep an erection, his erections are not firm enough for penetrative intercourse, he has difficulty maintaining his erections,  and he is not finding intercourse satisfactory for him.  ***  Patient still having spontaneous erections.  ***   He denies any pain or curvature with erections.    He is not able to ejaculate, has pain with ejaculation, and has blood in his ejaculate fluid.   ***  PMH: Past Medical History:  Diagnosis Date   Diabetes mellitus without complication (HCC)    Hypertension     Surgical History: Past Surgical History:   Procedure Laterality Date   head lesions removed  2015   HYDRADENITIS EXCISION N/A 02/26/2020   Procedure: EXCISION HIDRADENITIS, Perineum;  Surgeon: Jordis Laneta FALCON, MD;  Location: [facility];  Service: General;  Laterality: N/A;   INCISION AND DRAINAGE ABSCESS N/A 09/04/2020   Procedure: INCISION AND DRAINAGE SKIN ABSCESS;  Surgeon: Desiderio Schanz, MD;  Location: [facility];  Service: General;  Laterality: N/A;  Suprapubic area   INCISION AND DRAINAGE PERIRECTAL ABSCESS N/A 06/30/2019   Procedure: IRRIGATION AND DEBRIDEMENT PERIRECTAL ABSCESS;  Surgeon: Jordis Laneta FALCON, MD;  Location: [facility];  Service: General;  Laterality: N/A;   WOUND EXPLORATION N/A 09/06/2020   Procedure: WOUND EXPLORATION;  Surgeon: Jordis Laneta FALCON, MD;  Location: [facility];  Service: General;  Laterality: N/A;    Home Medications:  Allergies as of 09/11/2024   No Known Allergies      Medication List        Accurate as of September 10, 2024 10:35 PM. If you have any questions, ask your nurse or doctor.          amoxicillin -clavulanate 875-125 MG tablet Commonly known as: AUGMENTIN  Take 1 tablet by mouth 2 (two) times daily.   clobetasol  cream 0.05 % Commonly known as: TEMOVATE  Apply 1 Application topically 2 (two) times daily. Apply to eczema until skin is smooth, then stop. Restart as needed   Cosentyx  Sensoready (300 MG) 150 MG/ML Soaj Generic  drug: Secukinumab  (300 MG Dose) Inject 2 mLs (300 mg total) into the skin every 28 (twenty-eight) days. For maintenance.   Cosentyx  UnoReady 300 MG/2ML Soaj Generic drug: Secukinumab  Inject 300 mg into the skin every 14 (fourteen) days.   doxycycline  100 MG capsule Commonly known as: MONODOX  Take 1 capsule (100 mg total) by mouth 2 (two) times daily. As needed for flares. Take with food and drink   doxycycline  100 MG tablet Commonly known as: VIBRA -TABS Take 1 tablet (100 mg total) by mouth 2 (two) times daily.   Dupixent  300 MG/2ML Soaj Generic drug:  Dupilumab  Inject 300 mg into the skin every 14 (fourteen) days. Starting at day 15 for maintenance.   ondansetron  4 MG disintegrating tablet Commonly known as: ZOFRAN -ODT Take 1 tablet (4 mg total) by mouth every 8 (eight) hours as needed for nausea or vomiting.   triamcinolone  ointment 0.1 % Commonly known as: KENALOG  APPLY TO AFFECTED AREA TWICE A DAY X2WKS, THEN DECREASE TO 5 DAYS A WK THEREAFTER, AVOID THE FACE, GROIN &UNDERARMS   valACYclovir  500 MG tablet Commonly known as: VALTREX  Take 1 tablet (500 mg total) by mouth daily.        Allergies: No Known Allergies  Family History: Family History  Problem Relation Age of Onset   Hypertension Other    Diabetes Mother    Diabetes Father     Social History:  reports that he has been smoking cigarettes. He has been exposed to tobacco smoke. He has never used smokeless tobacco. He reports current alcohol use of about 6.0 standard drinks of alcohol per week. He reports current drug use. Drug: Marijuana.   ROS   Physical Exam: There were no vitals taken for this visit.  Constitutional:  Well nourished. Alert and oriented, No acute distress. HEENT: [place] AT, moist mucus membranes.  Trachea midline, no masses. Cardiovascular: No clubbing, cyanosis, or edema. Respiratory: Normal respiratory effort, no increased work of breathing. GI: Abdomen is soft, non tender, non distended, no abdominal masses. Liver and spleen not palpable.  No hernias appreciated.  Stool sample for occult testing is not indicated.   GU: No CVA tenderness.  No bladder fullness or masses.  Patient with circumcised/uncircumcised phallus. ***Foreskin easily retracted***  Urethral meatus is patent.  No penile discharge. No penile lesions or rashes. Scrotum without lesions, cysts, rashes and/or edema.  Testicles are located scrotally bilaterally. No masses are appreciated in the testicles. Left and right epididymis are normal. Rectal: Patient with  normal sphincter  tone. Anus and perineum without scarring or rashes. No rectal masses are appreciated. Prostate is approximately *** grams, *** nodules are appreciated. Seminal vesicles are normal. Skin: No rashes, bruises or suspicious lesions. Lymph: No cervical or inguinal adenopathy. Neurologic: Grossly intact, no focal deficits, moving all 4 extremities. Psychiatric: Normal mood and affect.   Laboratory Data: See Epic and HPI   I have reviewed the labs.   Pertinent Imaging: N/A   Assessment & Plan:    1. Genital herpes -***  2. BPH with LU TS -PSA obtained by PCP normal   No follow-ups on file.  These notes generated with voice recognition software. I apologize for typographical errors.  CLOTILDA CORNWALL, PA-C  [facility] [address]  [address] [place], [place] [address] [phone]

## 2024-09-11 ENCOUNTER — Other Ambulatory Visit: Payer: Self-pay | Admitting: Urology

## 2024-09-11 ENCOUNTER — Ambulatory Visit: Admitting: Urology

## 2024-09-11 DIAGNOSIS — A6002 Herpesviral infection of other male genital organs: Secondary | ICD-10-CM

## 2024-09-11 MED ORDER — VALACYCLOVIR HCL 500 MG PO TABS: 500.0000 mg | ORAL_TABLET | Freq: Every day | ORAL | 0 refills | Status: DC

## 2024-09-14 NOTE — Progress Notes (Unsigned)
 09/21/2024 10:28 AM   Curtis Peterson 26-Mar-1976 [id number]  Referring provider: Sadie Manna, MD [address] [facility] [place],  [place] [address]  Urological history 1. 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 BPH  -PSA (10/2022) 1.20   CC: Genital Herpes    HPI: Curtis Peterson is a 48 y.o. male who presents today for medication refills.   Previous records reviewed.  He has not had any herpetic breakouts since starting the Valtrex  500 mg daily.  I PSS 1/0  He reports nocturia x 1.   Patient denies any modifying or aggravating factors.  Patient denies any recent UTI's, gross hematuria, dysuria or suprapubic/flank pain.  Patient denies any fevers, chills, nausea or vomiting.  He does not have a family history of PCa, colon cancer, ovarian cancer, and/or breast cancer.       PSA  (10/2022) 1.20  Serum creatinine (06/2024) 0.55  SHIM 25   Patient still having spontaneous erections.  He denies any pain or curvature with erections.  He is having no issues with ejaculation.  PMH: Past Medical History:  Diagnosis Date   Diabetes mellitus without complication (HCC)    Hypertension     Surgical History: Past Surgical History:  Procedure Laterality Date   head lesions removed  2015   HYDRADENITIS EXCISION N/A 02/26/2020   Procedure: EXCISION HIDRADENITIS, Perineum;  Surgeon: Jordis Laneta FALCON, MD;  Location: [facility];  Service: General;  Laterality: N/A;   INCISION AND DRAINAGE ABSCESS N/A 09/04/2020   Procedure: INCISION AND DRAINAGE SKIN ABSCESS;  Surgeon: Desiderio Schanz, MD;  Location: [facility];  Service: General;  Laterality: N/A;  Suprapubic area   INCISION AND DRAINAGE PERIRECTAL ABSCESS N/A 06/30/2019   Procedure: IRRIGATION AND DEBRIDEMENT PERIRECTAL ABSCESS;  Surgeon: Jordis Laneta FALCON, MD;  Location: [facility];  Service: General;  Laterality: N/A;   WOUND EXPLORATION N/A 09/06/2020   Procedure: WOUND EXPLORATION;  Surgeon: Jordis Laneta FALCON, MD;  Location: [facility];  Service: General;  Laterality: N/A;    Home Medications:  Allergies as of 09/21/2024   No Known Allergies      Medication List        Accurate as of September 21, 2024 10:28 AM. If you have any questions, ask your nurse or doctor.          amoxicillin -clavulanate 875-125 MG tablet Commonly known as: AUGMENTIN  Take 1 tablet by mouth 2 (two) times daily.   clobetasol  cream 0.05 % Commonly known as: TEMOVATE  Apply 1 Application topically 2 (two) times daily. Apply to eczema until skin is smooth, then stop. Restart as needed   Cosentyx  UnoReady 300 MG/2ML Soaj Generic drug: Secukinumab  Inject 300 mg into the skin every 14 (fourteen) days.   doxycycline  100 MG capsule Commonly known as: MONODOX  Take 1 capsule (100 mg total) by mouth 2 (two) times daily. As needed for flares. Take with food and drink   Dupixent  300 MG/2ML Soaj Generic drug: Dupilumab  Inject 300 mg into the skin every 14 (fourteen) days. Starting at day 15 for maintenance.   ondansetron  4 MG disintegrating tablet Commonly known as: ZOFRAN -ODT Take 1 tablet (4 mg total) by mouth every 8 (eight) hours as needed for nausea or vomiting.   triamcinolone  ointment 0.1 % Commonly known as: KENALOG  APPLY TO AFFECTED AREA TWICE A DAY X2WKS, THEN DECREASE TO 5 DAYS A WK THEREAFTER, AVOID THE FACE, GROIN &UNDERARMS   valACYclovir  500 MG tablet Commonly known as: VALTREX  Take 1  tablet (500 mg total) by mouth daily.        Allergies: No Known Allergies  Family History: Family History  Problem Relation Age of Onset   Hypertension Other    Diabetes Mother    Diabetes Father     Social History:  reports that he has been smoking cigarettes. He has been exposed to tobacco smoke. He has never used smokeless tobacco. He reports current alcohol use of about 6.0 standard drinks of alcohol per week. He reports current drug use. Drug: Marijuana.   ROS   Physical Exam: BP (!)  141/96 (BP Location: Left Arm, Patient Position: Sitting, Cuff Size: Normal)   Pulse 90   Wt 232 lb (105.2 kg)   BMI 32.36 kg/m   Constitutional:  Well nourished. Alert and oriented, No acute distress. HEENT: [place] AT, moist mucus membranes.  Trachea midline Cardiovascular: No clubbing, cyanosis, or edema. Respiratory: Normal respiratory effort, no increased work of breathing. Neurologic: Grossly intact, no focal deficits, moving all 4 extremities. Psychiatric: Normal mood and affect.   Laboratory Data: See Epic and HPI   I have reviewed the labs.   Pertinent Imaging: N/A   Assessment & Plan:    1. Genital herpes - No outbreaks since on the suppressive Valtrex  500 mg daily - Refill sent to pharmacy - He is to notify us  of any breakouts  2. BPH with LU TS -PSA obtained by PCP normal   Return in about 1 year (around 09/21/2025) for I PSS, SHIM .  These notes generated with voice recognition software. I apologize for typographical errors.  CLOTILDA CORNWALL, PA-C  [facility] [address]  [address] [place], [place] [address] [phone]

## 2024-09-20 ENCOUNTER — Other Ambulatory Visit: Payer: Self-pay | Admitting: Dermatology

## 2024-09-20 DIAGNOSIS — L209 Atopic dermatitis, unspecified: Secondary | ICD-10-CM

## 2024-09-20 DIAGNOSIS — L732 Hidradenitis suppurativa: Secondary | ICD-10-CM

## 2024-09-20 MED ORDER — DUPIXENT 300 MG/2ML ~~LOC~~ SOAJ: 300.0000 mg | SUBCUTANEOUS | 11 refills | Status: AC

## 2024-09-20 MED ORDER — COSENTYX UNOREADY 300 MG/2ML ~~LOC~~ SOAJ: 300.0000 mg | SUBCUTANEOUS | 11 refills | Status: AC

## 2024-09-21 ENCOUNTER — Encounter: Payer: Self-pay | Admitting: Urology

## 2024-09-21 ENCOUNTER — Ambulatory Visit: Admitting: Urology

## 2024-09-21 VITALS — BP 141/96 | HR 90 | Wt 232.0 lb

## 2024-09-21 DIAGNOSIS — A6002 Herpesviral infection of other male genital organs: Secondary | ICD-10-CM

## 2024-09-21 DIAGNOSIS — N401 Enlarged prostate with lower urinary tract symptoms: Secondary | ICD-10-CM | POA: Diagnosis not present

## 2024-09-21 DIAGNOSIS — N138 Other obstructive and reflux uropathy: Secondary | ICD-10-CM

## 2024-09-21 MED ORDER — VALACYCLOVIR HCL 500 MG PO TABS: 500.0000 mg | ORAL_TABLET | Freq: Every day | ORAL | 3 refills | Status: AC

## 2024-09-24 ENCOUNTER — Encounter: Payer: Self-pay | Admitting: Dermatology

## 2024-09-24 ENCOUNTER — Ambulatory Visit: Payer: PRIVATE HEALTH INSURANCE | Admitting: Dermatology

## 2024-09-24 DIAGNOSIS — Z79899 Other long term (current) drug therapy: Secondary | ICD-10-CM | POA: Diagnosis not present

## 2024-09-24 DIAGNOSIS — Z7189 Other specified counseling: Secondary | ICD-10-CM

## 2024-09-24 DIAGNOSIS — L209 Atopic dermatitis, unspecified: Secondary | ICD-10-CM | POA: Diagnosis not present

## 2024-09-24 DIAGNOSIS — L732 Hidradenitis suppurativa: Secondary | ICD-10-CM

## 2024-09-24 NOTE — Patient Instructions (Signed)
 Pre-Operative Instructions You are scheduled for a surgical procedure at [facility]. We recommend you read the following instructions. If you have any questions or concerns, please call the office at [phone].  Shower and wash the entire body with soap and water the day of your surgery paying special attention to cleansing at and around the planned surgery site.  Please continue to take your anticoagulants (blood thinners) as you normally     would before and after surgery if they were prescribed by a medical provider. Stopping them could be harmful to you. We have multiple tools in dermatology to stop the bleeding even if you take an anticoagulant. If you take over the counter blood thinner such as aspirin, Ibuprofen (Motrin, Advil and Nuprin), Naprosyn , Voltaren , Relafen, etc. that was not prescribed or recommended by a medical provider, we recommend that you stop taking it for a week before your surgery and wait to restart until 2 days after your surgery.  Please inform us  of all medications you are currently taking. All medications that are taken regularly should be taken the day of surgery as you always do. Nevertheless, we need to be informed of what medications you are taking prior to surgery to know whether they will affect the procedure or cause any complications.   Please inform us  of any medication allergies. Also inform us  of whether you have allergies to Latex or rubber products or whether you have had any adverse reaction to Lidocaine  or Epinephrine .  Please inform us  of any prosthetic or artificial body parts such as artificial heart valve, joint replacements, etc., or similar condition that might require preoperative antibiotics.   We recommend avoidance of alcohol at least two weeks prior to surgery and continued avoidance for at least two weeks after surgery.   We recommend discontinuation of tobacco smoking at least two weeks prior to surgery and continued  abstinence for at least two weeks after surgery.  Do not plan strenuous exercise, strenuous work or strenuous lifting for approximately four weeks after your surgery.   We request if you are unable to make your scheduled surgical appointment, please call us  at least a week in advance or as soon as you are aware of a problem so that we can cancel or reschedule the appointment.   You MAY TAKE TYLENOL  (acetaminophen ) for pain as it is not a blood thinner.   PLEASE PLAN TO BE IN TOWN FOR TWO WEEKS FOLLOWING SURGERY, THIS IS IMPORTANT SO YOU CAN BE CHECKED FOR DRESSING CHANGES, FUTURE REMOVAL AND TO MONITOR FOR POSSIBLE COMPLICATIONS.   Due to recent changes in healthcare laws, you may see results of your pathology and/or laboratory studies on MyChart before the doctors have had a chance to review them. We understand that in some cases there may be results that are confusing or concerning to you. Please understand that not all results are received at the same time and often the doctors may need to interpret multiple results in order to provide you with the best plan of care or course of treatment. Therefore, we ask that you please give us  2 business days to thoroughly review all your results before contacting the office for clarification. Should we see a critical lab result, you will be contacted sooner.   If You Need Anything After Your Visit  If you have any questions or concerns for your doctor, please call our main line at [phone] and press option 4 to reach your doctor's medical assistant. If  no one answers, please leave a voicemail as directed and we will return your call as soon as possible. Messages left after 4 pm will be answered the following business day.   You may also send us  a message via MyChart. We typically respond to MyChart messages within 1-2 business days.  For prescription refills, please ask your pharmacy to contact our office. Our fax number is [phone].  If you have  an urgent issue when the clinic is closed that cannot wait until the next business day, you can page your doctor at the number below.    Please note that while we do our best to be available for urgent issues outside of office hours, we are not available 24/7.   If you have an urgent issue and are unable to reach us , you may choose to seek medical care at your doctor's office, retail clinic, urgent care center, or emergency room.  If you have a medical emergency, please immediately call 911 or go to the emergency department.  Pager Numbers  - Dr. Hester: [phone]  - Dr. Jackquline: [phone]  - Dr. Claudene: [phone]   - Dr. Raymund: [phone]  In the event of inclement weather, please call our main line at [phone] for an update on the status of any delays or closures.  Dermatology Medication Tips: Please keep the boxes that topical medications come in in order to help keep track of the instructions about where and how to use these. Pharmacies typically print the medication instructions only on the boxes and not directly on the medication tubes.   If your medication is too expensive, please contact our office at [phone] option 4 or send us  a message through MyChart.   We are unable to tell what your co-pay for medications will be in advance as this is different depending on your insurance coverage. However, we may be able to find a substitute medication at lower cost or fill out paperwork to get insurance to cover a needed medication.   If a prior authorization is required to get your medication covered by your insurance company, please allow us  1-2 business days to complete this process.  Drug prices often vary depending on where the prescription is filled and some pharmacies may offer cheaper prices.  The website www.goodrx.com contains coupons for medications through different pharmacies. The prices here do not account for what the cost may be with help from  insurance (it may be cheaper with your insurance), but the website can give you the price if you did not use any insurance.  - You can print the associated coupon and take it with your prescription to the pharmacy.  - You may also stop by our office during regular business hours and pick up a GoodRx coupon card.  - If you need your prescription sent electronically to a different pharmacy, notify our office through [facility] or by phone at [phone] option 4.     Si Usted Necesita Algo Despus de Su Visita  Tambin puede enviarnos un mensaje a travs de Clinical cytogeneticist. Por lo general respondemos a los mensajes de MyChart en el transcurso de 1 a 2 das hbiles.  Para renovar recetas, por favor pida a su farmacia que se ponga en contacto con nuestra oficina. Randi lakes de fax es [address] [phone].  Si tiene un asunto urgente cuando la clnica est cerrada y que no puede esperar hasta el siguiente da hbil, puede llamar/localizar a su doctor(a) al nmero que aparece a continuacin.  Por favor, tenga en cuenta que aunque hacemos todo lo posible para estar disponibles para asuntos urgentes fuera del horario de [place], no estamos disponibles las 24 horas del da, los 7 [address]  [address] de la [place].   Si tiene un problema urgente y no puede comunicarse con nosotros, puede optar por buscar atencin mdica  en el consultorio de su doctor(a), en una clnica privada, en un centro de atencin urgente o en una sala de emergencias.  Si tiene Engineer, drilling, por favor llame inmediatamente al 911 o vaya a la sala de emergencias.  Nmeros de bper  - Dr. Hester: [phone]  - Dra. Jackquline: [phone]  - Dr. Claudene: [phone]  - Dra. Kitts: [phone]  En caso de inclemencias del [place], por favor llame a nuestra lnea principal al [phone] para una actualizacin sobre el estado de cualquier retraso o cierre.  Consejos para la medicacin en dermatologa: Por favor, guarde las  cajas en las que vienen los medicamentos de uso tpico para ayudarle a seguir las instrucciones sobre dnde y cmo usarlos. Las farmacias generalmente imprimen las instrucciones del medicamento slo en las cajas y no directamente en los tubos del [place].   Si su medicamento es muy caro, por favor, pngase en contacto con landry rieger llamando al [phone] y presione la opcin 4 o envenos un mensaje a travs de Clinical cytogeneticist.   No podemos decirle cul ser su copago por los medicamentos por adelantado ya que esto es diferente dependiendo de la cobertura de su seguro. Sin embargo, es posible que podamos encontrar un medicamento sustituto a Audiological scientist un formulario para que el seguro cubra el medicamento que se considera necesario.   Si se requiere una autorizacin previa para que su compaa de seguros malta su medicamento, por favor permtanos de 1 a 2 das hbiles para completar este proceso.  Los precios de los medicamentos varan con frecuencia dependiendo del Environmental consultant de dnde se surte la receta y alguna farmacias pueden ofrecer precios ms baratos.  El sitio web www.goodrx.com tiene cupones para medicamentos de Health and safety inspector. Los precios aqu no tienen en cuenta lo que podra costar con la ayuda del seguro (puede ser ms barato con su seguro), pero el sitio web puede darle el precio si no utiliz Tourist information centre manager.  - Puede imprimir el cupn correspondiente y llevarlo con su receta a la farmacia.  - Tambin puede pasar por nuestra oficina durante el horario de atencin regular y Education officer, museum una tarjeta de cupones de GoodRx.  - Si necesita que su receta se enve electrnicamente a una farmacia diferente, informe a nuestra oficina a travs de MyChart de [place] o por telfono llamando al [phone] y presione la opcin 4.

## 2024-09-24 NOTE — Progress Notes (Signed)
 Follow-Up Visit   Subjective  Curtis Peterson is a 48 y.o. male who presents for the following: Psoriasis f/u, Cosentyx  sq injections, Atopic Derm Dupixent  sq injections, Clobetasol  cr He forgot to take a Cosentyx  dose so now he has a flare up on bilateral legs, lt is worse. Patient also did not take last Dupixent  injection  The following portions of the chart were reviewed this encounter and updated as appropriate: medications, allergies, medical history  Review of Systems:  No other skin or systemic complaints except as noted in HPI or Assessment and Plan.  Objective  Well appearing patient in no apparent distress; mood and affect are within normal limits.   A focused examination was performed of the following areas: Trunk arms  Relevant exam findings are noted in the Assessment and Plan.    Assessment & Plan   ATOPIC DERMATITIS Legs, arms Exam: Scaly pink papules coalescing to plaques on abdomen arms back 5-10% BSA    Atopic dermatitis - Severe, on Dupixent  (biologic medication).  Atopic dermatitis (eczema) is a chronic, relapsing, pruritic condition that can significantly affect quality of life. It is often associated with allergic rhinitis and/or asthma and can require treatment with topical medications, phototherapy, or in severe cases biologic medications, which require long term medication management.    Treatment Plan: Cont Dupixent  sq injections q 2 wks Cont Clobetasol  cr qd/bid aa eczema prn flares, avoid f/g/a  Recommend gentle skin care.  Dupilumab  (Dupixent ) is a biologic treatment given by injection for adults and children with moderate-to-severe atopic dermatitis. Goal is control of skin condition, not cure. It is given as 2 injections at the first dose followed by 1 injection every 2 weeks thereafter.  Young children are dosed monthly.  Potential side effects include allergic reaction, herpes infections, injection site reactions and conjunctivitis  (inflammation of the eyes).  The use of Dupixent  requires long term medication management, including periodic office visits.  Topical steroids (such as triamcinolone , fluocinolone, fluocinonide, mometasone, clobetasol , halobetasol, betamethasone, hydrocortisone) can cause thinning and lightening of the skin if they are used for too long in the same area. Your physician has selected the right strength medicine for your problem and area affected on the body. Please use your medication only as directed by your physician to prevent side effects.    HIDRADENITIS SUPPURATIVA Lower abdomen, mons pubis Exam: hyperpigmented indurated interconnected sinus tracks with purulent drainage on infrapannus and mons  Chronic and persistent condition with duration or expected duration over one year. Condition is bothersome/symptomatic for patient. Currently flared.   Hidradenitis Suppurativa is a chronic; persistent; non-curable, but treatable condition due to abnormal inflamed sweat glands in the body folds (axilla, inframammary, groin, medial thighs), causing recurrent painful draining cysts and scarring. It can be associated with severe scarring acne and cysts; also abscesses and scarring of scalp. The goal is control and prevention of flares, as it is not curable. Scars are permanent and can be thickened. Treatment may include daily use of topical medication and oral antibiotics.  Oral isotretinoin  may also be helpful.  For some cases, Humira or Cosentyx  (biologic injections) may be prescribed to decrease the inflammatory process and prevent flares.  When indicated, inflamed cysts may also be treated surgically.  Treatment Plan: Cont Cosentyx  sq injections q 2 wks Discussed sinus tract surgery, pt will schedule  QuantiFERON- TB Gold lab ordered  Reviewed risks of biologics including immunosuppression, infections (i.e. TB reactivation), injection site reaction, and failure to improve condition. Goal is control of  skin condition, not cure.  Some older biologics such as Humira and Enbrel may slightly increase risk of malignancy and may worsen congestive heart failure.  Taltz, Cosentyx , and Bimzelx may cause inflammatory bowel disease to flare or may increase incidence of yeast infections. Skyrizi, Tremfya, and Stelara may also slightly increase risk of infection. The use of biologics requires long term medication management, including periodic office visits, annual TB screening test and monitoring of blood work.  HIDRADENITIS SUPPURATIVA   This Visit - QuantiFERON-TB Gold Plus Existing Treatments - amoxicillin -clavulanate (AUGMENTIN ) 875-125 MG tablet - Take 1 tablet by mouth 2 (two) times daily. - doxycycline  (MONODOX ) 100 MG capsule - Take 1 capsule (100 mg total) by mouth 2 (two) times daily. As needed for flares. Take with food and drink - Secukinumab  (COSENTYX  UNOREADY) 300 MG/2ML SOAJ - Inject 300 mg into the skin every 14 (fourteen) days. ATOPIC DERMATITIS, UNSPECIFIED TYPE   Existing Treatments - triamcinolone  ointment (KENALOG ) 0.1 % - APPLY TO AFFECTED AREA TWICE A DAY X2WKS, THEN DECREASE TO 5 DAYS A WK THEREAFTER, AVOID THE FACE, GROIN &UNDERARMS - Dupilumab  (DUPIXENT ) 300 MG/2ML SOAJ - Inject 300 mg into the skin every 14 (fourteen) days. Starting at day 15 for maintenance. COUNSELING AND COORDINATION OF CARE   Existing Treatments - amoxicillin -clavulanate (AUGMENTIN ) 875-125 MG tablet - Take 1 tablet by mouth 2 (two) times daily. MEDICATION MANAGEMENT   LONG-TERM USE OF HIGH-RISK MEDICATION   Existing Treatments - amoxicillin -clavulanate (AUGMENTIN ) 875-125 MG tablet - Take 1 tablet by mouth 2 (two) times daily.  Return for surgery sinus tract for HS, 49m f/u Atopic derm/psoriasis.  I, Grayce Saunas, RMA, am acting as scribe for Boneta Sharps, MD .   Documentation: I have reviewed the above documentation for accuracy and completeness, and I agree with the  above.  Boneta Sharps, MD

## 2024-11-18 ENCOUNTER — Encounter: Admitting: Dermatology

## 2025-04-01 ENCOUNTER — Ambulatory Visit: Admitting: Dermatology

## 2025-09-21 ENCOUNTER — Ambulatory Visit: Admitting: Urology
